# Patient Record
Sex: Female | Born: 1990
Health system: Southern US, Community
[De-identification: ages and names within clinical notes are randomized; demographics above are authoritative.]

## PROBLEM LIST (undated history)

## (undated) ENCOUNTER — Inpatient Hospital Stay (HOSPITAL_COMMUNITY): Payer: Self-pay

## (undated) ENCOUNTER — Inpatient Hospital Stay (HOSPITAL_COMMUNITY): Admission: RE | Payer: Self-pay | Source: Ambulatory Visit

## (undated) DIAGNOSIS — O139 Gestational [pregnancy-induced] hypertension without significant proteinuria, unspecified trimester: Secondary | ICD-10-CM

## (undated) DIAGNOSIS — K08199 Complete loss of teeth due to other specified cause, unspecified class: Secondary | ICD-10-CM

## (undated) DIAGNOSIS — O24419 Gestational diabetes mellitus in pregnancy, unspecified control: Secondary | ICD-10-CM

## (undated) DIAGNOSIS — J40 Bronchitis, not specified as acute or chronic: Secondary | ICD-10-CM

## (undated) DIAGNOSIS — K8020 Calculus of gallbladder without cholecystitis without obstruction: Secondary | ICD-10-CM

## (undated) DIAGNOSIS — N760 Acute vaginitis: Secondary | ICD-10-CM

## (undated) DIAGNOSIS — F419 Anxiety disorder, unspecified: Secondary | ICD-10-CM

## (undated) DIAGNOSIS — F1210 Cannabis abuse, uncomplicated: Secondary | ICD-10-CM

## (undated) DIAGNOSIS — R51 Headache: Secondary | ICD-10-CM

## (undated) DIAGNOSIS — E282 Polycystic ovarian syndrome: Secondary | ICD-10-CM

## (undated) DIAGNOSIS — Z803 Family history of malignant neoplasm of breast: Secondary | ICD-10-CM

## (undated) DIAGNOSIS — B9689 Other specified bacterial agents as the cause of diseases classified elsewhere: Secondary | ICD-10-CM

## (undated) DIAGNOSIS — IMO0001 Reserved for inherently not codable concepts without codable children: Secondary | ICD-10-CM

## (undated) HISTORY — DX: Gestational diabetes mellitus in pregnancy, unspecified control: O24.419

## (undated) HISTORY — PX: MOUTH SURGERY: SHX715

## (undated) HISTORY — DX: Cannabis abuse, uncomplicated: F12.10

## (undated) HISTORY — DX: Gestational (pregnancy-induced) hypertension without significant proteinuria, unspecified trimester: O13.9

## (undated) HISTORY — DX: Calculus of gallbladder without cholecystitis without obstruction: K80.20

## (undated) HISTORY — DX: Family history of malignant neoplasm of breast: Z80.3

## (undated) HISTORY — PX: MULTIPLE TOOTH EXTRACTIONS: SHX2053

---
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
## 2009-02-16 ENCOUNTER — Emergency Department (HOSPITAL_COMMUNITY): Admission: EM | Admit: 2009-02-16 | Discharge: 2009-02-16 | Payer: Self-pay | Admitting: Emergency Medicine

## 2009-05-06 ENCOUNTER — Emergency Department (HOSPITAL_COMMUNITY): Admission: EM | Admit: 2009-05-06 | Discharge: 2009-05-06 | Payer: Self-pay | Admitting: Emergency Medicine

## 2009-07-23 ENCOUNTER — Emergency Department (HOSPITAL_COMMUNITY): Admission: EM | Admit: 2009-07-23 | Discharge: 2009-07-24 | Payer: Self-pay | Admitting: Emergency Medicine

## 2010-01-29 ENCOUNTER — Emergency Department (HOSPITAL_COMMUNITY): Admission: EM | Admit: 2010-01-29 | Discharge: 2010-01-29 | Payer: Self-pay | Admitting: Emergency Medicine

## 2010-09-15 ENCOUNTER — Emergency Department (HOSPITAL_COMMUNITY): Admission: EM | Admit: 2010-09-15 | Discharge: 2010-09-15 | Payer: Self-pay | Admitting: Emergency Medicine

## 2010-10-01 ENCOUNTER — Ambulatory Visit: Payer: Self-pay | Admitting: Obstetrics and Gynecology

## 2010-10-01 LAB — CONVERTED CEMR LAB
Prolactin: 8.5 ng/mL
hCG, Beta Chain, Quant, S: <2 milliintl units/mL

## 2010-10-02 ENCOUNTER — Encounter: Payer: Self-pay | Admitting: Obstetrics and Gynecology

## 2010-10-02 LAB — CONVERTED CEMR LAB
Trich, Wet Prep: NONE SEEN
Yeast Wet Prep HPF POC: NONE SEEN

## 2010-10-03 ENCOUNTER — Ambulatory Visit (HOSPITAL_COMMUNITY)
Admission: RE | Admit: 2010-10-03 | Discharge: 2010-10-03 | Payer: Self-pay | Source: Home / Self Care | Admitting: Family Medicine

## 2010-10-24 ENCOUNTER — Ambulatory Visit: Payer: Self-pay | Admitting: Obstetrics and Gynecology

## 2010-11-07 ENCOUNTER — Ambulatory Visit: Payer: Self-pay | Admitting: Obstetrics & Gynecology

## 2010-11-20 ENCOUNTER — Encounter: Payer: Self-pay | Admitting: Obstetrics and Gynecology

## 2010-11-20 ENCOUNTER — Ambulatory Visit
Admission: RE | Admit: 2010-11-20 | Discharge: 2010-11-20 | Payer: Self-pay | Source: Home / Self Care | Attending: Obstetrics & Gynecology | Admitting: Obstetrics & Gynecology

## 2010-11-20 LAB — CONVERTED CEMR LAB: Hgb A1c MFr Bld: 5.3 % (ref <5.7)

## 2011-01-28 LAB — URINE MICROSCOPIC-ADD ON

## 2011-01-28 LAB — URINALYSIS, ROUTINE W REFLEX MICROSCOPIC
Leukocytes, UA: NEGATIVE
Nitrite: POSITIVE — AB
Specific Gravity, Urine: 1.004 — ABNORMAL LOW (ref 1.005–1.030)
pH: 6 (ref 5.0–8.0)

## 2011-01-28 LAB — WET PREP, GENITAL: Trich, Wet Prep: NONE SEEN

## 2011-01-30 ENCOUNTER — Ambulatory Visit: Payer: Self-pay | Admitting: Family Medicine

## 2011-07-14 ENCOUNTER — Emergency Department (HOSPITAL_COMMUNITY)
Admission: EM | Admit: 2011-07-14 | Discharge: 2011-07-14 | Disposition: A | Discharge status: Home / Self Care | Payer: Self-pay | Attending: Emergency Medicine | Admitting: Emergency Medicine

## 2011-07-14 DIAGNOSIS — S01501A Unspecified open wound of lip, initial encounter: Secondary | ICD-10-CM | POA: Insufficient documentation

## 2011-07-14 DIAGNOSIS — R220 Localized swelling, mass and lump, head: Secondary | ICD-10-CM | POA: Insufficient documentation

## 2011-07-14 DIAGNOSIS — S0003XA Contusion of scalp, initial encounter: Secondary | ICD-10-CM | POA: Insufficient documentation

## 2011-07-14 DIAGNOSIS — R221 Localized swelling, mass and lump, neck: Secondary | ICD-10-CM | POA: Insufficient documentation

## 2011-07-14 DIAGNOSIS — S0083XA Contusion of other part of head, initial encounter: Secondary | ICD-10-CM | POA: Insufficient documentation

## 2011-07-14 DIAGNOSIS — R51 Headache: Secondary | ICD-10-CM | POA: Insufficient documentation

## 2011-07-19 ENCOUNTER — Emergency Department (HOSPITAL_COMMUNITY)
Admission: EM | Admit: 2011-07-19 | Discharge: 2011-07-19 | Disposition: A | Discharge status: Home / Self Care | Payer: Self-pay | Attending: Emergency Medicine | Admitting: Emergency Medicine

## 2011-07-19 DIAGNOSIS — Z4802 Encounter for removal of sutures: Secondary | ICD-10-CM | POA: Insufficient documentation

## 2011-08-10 LAB — POCT URINALYSIS DIP (DEVICE)
Operator id: 235561
Protein, ur: 30 — AB
Specific Gravity, Urine: 1.02
Urobilinogen, UA: 0.2
pH: 6

## 2011-08-10 LAB — POCT PREGNANCY, URINE: Operator id: 235561

## 2011-08-25 LAB — CBC
MCV: 91.2
Platelets: 304
RDW: 11.8
WBC: 8.4

## 2011-08-25 LAB — TSH: TSH: 1.369

## 2011-08-25 LAB — COMPREHENSIVE METABOLIC PANEL
AST: 27
Albumin: 4
Calcium: 9
Chloride: 103
Creatinine, Ser: 0.67
Total Bilirubin: 0.6
Total Protein: 7.4

## 2011-08-25 LAB — DIFFERENTIAL
Eosinophils Relative: 1
Lymphocytes Relative: 28
Lymphs Abs: 2.3
Monocytes Absolute: 0.4
Monocytes Relative: 5
Neutro Abs: 5.6

## 2011-08-25 LAB — RAPID URINE DRUG SCREEN, HOSP PERFORMED
Amphetamines: NOT DETECTED
Barbiturates: NOT DETECTED
Benzodiazepines: NOT DETECTED
Cocaine: NOT DETECTED
Opiates: NOT DETECTED

## 2011-08-25 LAB — URINALYSIS, ROUTINE W REFLEX MICROSCOPIC
Bilirubin Urine: NEGATIVE
Glucose, UA: NEGATIVE
Hgb urine dipstick: NEGATIVE
Protein, ur: NEGATIVE
Urobilinogen, UA: 0.2

## 2011-08-25 LAB — PREGNANCY, URINE: Preg Test, Ur: NEGATIVE

## 2012-03-16 ENCOUNTER — Emergency Department (HOSPITAL_COMMUNITY)
Admission: EM | Admit: 2012-03-16 | Discharge: 2012-03-16 | Disposition: A | Discharge status: Home / Self Care | Payer: Self-pay | Attending: Emergency Medicine | Admitting: Emergency Medicine

## 2012-03-16 ENCOUNTER — Encounter (HOSPITAL_COMMUNITY): Payer: Self-pay | Admitting: Emergency Medicine

## 2012-03-16 DIAGNOSIS — A499 Bacterial infection, unspecified: Secondary | ICD-10-CM | POA: Insufficient documentation

## 2012-03-16 DIAGNOSIS — N760 Acute vaginitis: Secondary | ICD-10-CM | POA: Insufficient documentation

## 2012-03-16 DIAGNOSIS — IMO0001 Reserved for inherently not codable concepts without codable children: Secondary | ICD-10-CM | POA: Insufficient documentation

## 2012-03-16 DIAGNOSIS — B9689 Other specified bacterial agents as the cause of diseases classified elsewhere: Secondary | ICD-10-CM | POA: Insufficient documentation

## 2012-03-16 HISTORY — DX: Reserved for inherently not codable concepts without codable children: IMO0001

## 2012-03-16 LAB — WET PREP, GENITAL
Trich, Wet Prep: NONE SEEN
Yeast Wet Prep HPF POC: NONE SEEN

## 2012-03-16 LAB — URINALYSIS, ROUTINE W REFLEX MICROSCOPIC
Protein, ur: NEGATIVE mg/dL
Specific Gravity, Urine: 1.027 (ref 1.005–1.030)

## 2012-03-16 MED ORDER — METRONIDAZOLE 500 MG PO TABS: 500.0000 mg | ORAL_TABLET | Freq: Two times a day (BID) | ORAL | Status: AC

## 2012-03-16 NOTE — ED Provider Notes (Signed)
History     CSN: [id number]  Arrival date & time 03/16/12  2012   First MD Initiated Contact with Patient 03/16/12 2223      Chief Complaint  Patient presents with  . Exposure to STD    (Consider location/radiation/quality/duration/timing/severity/associated sxs/prior treatment) HPI Comments: Patient comes in with concern that she may have a STD.  She reports that she recently began having unprotected sex with a new partner.  Over the past 2 weeks she reports that she has been having whitish color vaginal discharge. She denies any dysuria.  Denies any fever or chills.  Denies any abdominal pain or pelvic pain.  She does not have any prior history of STD's.  She is unsure if her partner has any STD's.  Patient is a 21 y.o. female presenting with STD exposure. The history is provided by the patient.  Exposure to STD Pertinent negatives include no abdominal pain, chills, fever, nausea, rash or vomiting.    Past Medical History  Diagnosis Date  . No significant past medical history     History reviewed. No pertinent past surgical history.  History reviewed. No pertinent family history.  History  Substance Use Topics  . Smoking status: Never Smoker   . Smokeless tobacco: Not on file  . Alcohol Use: Yes     Occassional Use    OB History    Grav Para Term Preterm Abortions TAB SAB Ect Mult Living                  Review of Systems  Constitutional: Negative for fever and chills.  Respiratory: Negative for shortness of breath.   Gastrointestinal: Negative for nausea, vomiting and abdominal pain.  Genitourinary: Positive for vaginal discharge. Negative for dysuria, frequency, hematuria, flank pain, decreased urine volume, vaginal bleeding, genital sores, vaginal pain, pelvic pain and dyspareunia.  Skin: Negative for rash.    Allergies  Latex  Home Medications  No current outpatient prescriptions on file.  BP 135/67  Pulse 77  Temp(Src) 98.1 F (36.7 C) (Oral)  Resp  20  SpO2 99%  Physical Exam  Nursing note and vitals reviewed. Constitutional: She appears well-developed and well-nourished.  HENT:  Head: Normocephalic and atraumatic.  Cardiovascular: Normal rate, regular rhythm and normal heart sounds.   Pulmonary/Chest: Effort normal and breath sounds normal. No respiratory distress. She has no wheezes. She has no rales. She exhibits no tenderness.  Abdominal: Soft. Bowel sounds are normal. She exhibits no distension and no mass. There is no tenderness. There is no rebound and no guarding.  Genitourinary: Vagina normal and uterus normal. There is no rash, tenderness or lesion on the right labia. There is no rash, tenderness or lesion on the left labia. Cervix exhibits discharge. Cervix exhibits no motion tenderness and no friability. Right adnexum displays no mass, no tenderness and no fullness. Left adnexum displays no mass, no tenderness and no fullness.  Neurological: She is alert.  Skin: Skin is warm and dry. No rash noted. No erythema.  Psychiatric: She has a normal mood and affect.    ED Course  Procedures (including critical care time)   Labs Reviewed  URINALYSIS, ROUTINE W REFLEX MICROSCOPIC  POCT PREGNANCY, URINE  GC/CHLAMYDIA PROBE AMP, GENITAL  WET PREP, GENITAL   No results found.   1. Bacterial vaginosis       MDM  Patient comes in today with a chief complaint of whitish color vaginal discharge.  Wet prep positive for BV.  Patient given RX  for Flagyl and instructed to not drink alcohol while taking this medication.  GC/Chlamydia pending.        Pascal Lux [place], PA-C 03/18/12 1434

## 2012-03-16 NOTE — ED Notes (Signed)
D/c instructions reviewed w/ pt - pt denies any further questions or concerns at present.   

## 2012-03-16 NOTE — ED Notes (Signed)
Pt c/o white vaginal discharge with no odor.  States recent hx of bacterial vaginosis.  Denies pain, n/v.

## 2012-03-16 NOTE — ED Notes (Signed)
Patient reports that she has recently become sexually active; patient's partner told her that he has a history of STDs.  Patient reports itchiness, burning, and swelling in vaginal area.  Patient requesting to be tested for STDs.

## 2012-03-16 NOTE — Discharge Instructions (Signed)
Bacterial Vaginosis Bacterial vaginosis (BV) is a vaginal infection where the normal balance of bacteria in the vagina is disrupted. The normal balance is then replaced by an overgrowth of certain bacteria. There are several different kinds of bacteria that can cause BV. BV is the most common vaginal infection in women of childbearing age. CAUSES   The cause of BV is not fully understood. BV develops when there is an increase or imbalance of harmful bacteria.   Some activities or behaviors can upset the normal balance of bacteria in the vagina and put women at increased risk including:   Having a new sex partner or multiple sex partners.   Douching.   Using an intrauterine device (IUD) for contraception.   It is not clear what role sexual activity plays in the development of BV. However, women that have never had sexual intercourse are rarely infected with BV.  Women do not get BV from toilet seats, bedding, swimming pools or from touching objects around them.  SYMPTOMS   Grey vaginal discharge.   A fish-like odor with discharge, especially after sexual intercourse.   Itching or burning of the vagina and vulva.   Burning or pain with urination.   Some women have no signs or symptoms at all.  DIAGNOSIS  Your caregiver must examine the vagina for signs of BV. Your caregiver will perform lab tests and look at the sample of vaginal fluid through a microscope. They will look for bacteria and abnormal cells (clue cells), a pH test higher than 4.5, and a positive amine test all associated with BV.  RISKS AND COMPLICATIONS   Pelvic inflammatory disease (PID).   Infections following gynecology surgery.   Developing HIV.   Developing herpes virus.  TREATMENT  Sometimes BV will clear up without treatment. However, all women with symptoms of BV should be treated to avoid complications, especially if gynecology surgery is planned. Female partners generally do not need to be treated. However,  BV may spread between female sex partners so treatment is helpful in preventing a recurrence of BV.   BV may be treated with antibiotics. The antibiotics come in either pill or vaginal cream forms. Either can be used with nonpregnant or pregnant women, but the recommended dosages differ. These antibiotics are not harmful to the baby.   BV can recur after treatment. If this happens, a second round of antibiotics will often be prescribed.   Treatment is important for pregnant women. If not treated, BV can cause a premature delivery, especially for a pregnant woman who had a premature birth in the past. All pregnant women who have symptoms of BV should be checked and treated.   For chronic reoccurrence of BV, treatment with a type of prescribed gel vaginally twice a week is helpful.  HOME CARE INSTRUCTIONS   Finish all medication as directed by your caregiver.   Do not have sex until treatment is completed.   Tell your sexual partner that you have a vaginal infection. They should see their caregiver and be treated if they have problems, such as a mild rash or itching.   Practice safe sex. Use condoms. Only have 1 sex partner.  PREVENTION  Basic prevention steps can help reduce the risk of upsetting the natural balance of bacteria in the vagina and developing BV:  Do not have sexual intercourse (be abstinent).   Do not douche.   Use all of the medicine prescribed for treatment of BV, even if the signs and symptoms go away.     Tell your sex partner if you have BV. That way, they can be treated, if needed, to prevent reoccurrence.  SEEK MEDICAL CARE IF:   Your symptoms are not improving after 3 days of treatment.   You have increased discharge, pain, or fever.  MAKE SURE YOU:   Understand these instructions.   Will watch your condition.   Will get help right away if you are not doing well or get worse.  FOR MORE INFORMATION  Division of STD Prevention (DSTDP), Centers for Disease  Control and Prevention: www.cdc.gov/std American [employer] Health Association (ASHA): www.ashastd.org  Document Released: 11/02/2005 Document Revised: 10/22/2011 Document Reviewed: 04/25/2009 Exit[employer] Patient Information 2012 [employer]. 

## 2012-03-18 LAB — GC/CHLAMYDIA PROBE AMP, GENITAL: Chlamydia, DNA Probe: NEGATIVE

## 2012-03-28 NOTE — ED Provider Notes (Signed)
Medical screening examination/treatment/procedure(s) were performed by non-physician practitioner and as supervising physician I was immediately available for consultation/collaboration.   Loren Racer, MD 03/28/12 [phone]

## 2012-09-05 ENCOUNTER — Emergency Department (HOSPITAL_COMMUNITY)
Admission: EM | Admit: 2012-09-05 | Discharge: 2012-09-05 | Disposition: A | Discharge status: Home / Self Care | Payer: Self-pay | Attending: Emergency Medicine | Admitting: Emergency Medicine

## 2012-09-05 ENCOUNTER — Encounter (HOSPITAL_COMMUNITY): Payer: Self-pay

## 2012-09-05 DIAGNOSIS — N898 Other specified noninflammatory disorders of vagina: Secondary | ICD-10-CM | POA: Insufficient documentation

## 2012-09-05 DIAGNOSIS — Z91040 Latex allergy status: Secondary | ICD-10-CM | POA: Insufficient documentation

## 2012-09-05 DIAGNOSIS — R110 Nausea: Secondary | ICD-10-CM | POA: Insufficient documentation

## 2012-09-05 DIAGNOSIS — R1084 Generalized abdominal pain: Secondary | ICD-10-CM | POA: Insufficient documentation

## 2012-09-05 DIAGNOSIS — N912 Amenorrhea, unspecified: Secondary | ICD-10-CM | POA: Insufficient documentation

## 2012-09-05 DIAGNOSIS — R109 Unspecified abdominal pain: Secondary | ICD-10-CM

## 2012-09-05 LAB — URINALYSIS, ROUTINE W REFLEX MICROSCOPIC
Glucose, UA: NEGATIVE mg/dL
Ketones, ur: 40 mg/dL — AB
Leukocytes, UA: NEGATIVE
Nitrite: NEGATIVE
Specific Gravity, Urine: 1.028 (ref 1.005–1.030)
pH: 6 (ref 5.0–8.0)

## 2012-09-05 LAB — LIPASE, BLOOD: Lipase: 28 U/L (ref 11–59)

## 2012-09-05 LAB — CBC WITH DIFFERENTIAL/PLATELET
Basophils Absolute: 0 10*3/uL (ref 0.0–0.1)
Basophils Relative: 0 % (ref 0–1)
Eosinophils Absolute: 0.1 10*3/uL (ref 0.0–0.7)
MCH: 30.8 pg (ref 26.0–34.0)
MCHC: 33.2 g/dL (ref 30.0–36.0)
Monocytes Absolute: 0.9 10*3/uL (ref 0.1–1.0)
Neutro Abs: 7.2 10*3/uL (ref 1.7–7.7)
Neutrophils Relative %: 71 % (ref 43–77)
RDW: 12.5 % (ref 11.5–15.5)

## 2012-09-05 LAB — COMPREHENSIVE METABOLIC PANEL
AST: 17 U/L (ref 0–37)
Albumin: 4.1 g/dL (ref 3.5–5.2)
Chloride: 104 mEq/L (ref 96–112)
Creatinine, Ser: 0.71 mg/dL (ref 0.50–1.10)
Potassium: 4 mEq/L (ref 3.5–5.1)
Total Bilirubin: 0.5 mg/dL (ref 0.3–1.2)
Total Protein: 7.4 g/dL (ref 6.0–8.3)

## 2012-09-05 LAB — WET PREP, GENITAL
Clue Cells Wet Prep HPF POC: NONE SEEN
Trich, Wet Prep: NONE SEEN
WBC, Wet Prep HPF POC: NONE SEEN
Yeast Wet Prep HPF POC: NONE SEEN

## 2012-09-05 LAB — POCT PREGNANCY, URINE: Preg Test, Ur: NEGATIVE

## 2012-09-05 NOTE — ED Notes (Signed)
Pt sts she does not have a menstrual cycle ever and no known cause for not having one.

## 2012-09-05 NOTE — ED Provider Notes (Signed)
History     CSN: [id number]  Arrival date & time 09/05/12  1231   First MD Initiated Contact with Patient 09/05/12 1256      Chief Complaint  Patient presents with  . Abdominal Pain    (Consider location/radiation/quality/duration/timing/severity/associated sxs/prior treatment) HPI Comments: Patient reports she has had diffuse lower abdominal pain x 3-4 weeks.  Pain is described as sore.  She has also been having nausea every morning.  Nausea is worse with eating.  Pt also notes "lots of pressure" when she has to have a bowel movement, increased number of bowel movements (8 daily vs her norm of 2-3 daily).  States pain is much improved after bowel movements, and last week she did a "colon cleanse" which temporarily alleviated her pain.  Pain also improves with exercise.  Has had three days of watery vaginal discharge following sexual activity last week during which the condom broke.  Pt has vomited twice in the past 4 weeks, only with brushing her teeth.  Denies fevers, chills, myalgias, urinary symptoms.  Pt has hx of amenorrhea, only menstruates when she is given birth control pills or "provera challenge" - states she has been off of these for a few months because her prescription ran out and she has not made a new appointment.  Has taken pregnancy tests weekly for the past three weeks that have been negative.  Pt also notes she was taking weight loss pills when this pain began, last dose was two weeks ago.  Now she is taking something called "Vitex" that is a vitamin supplement designed for people who have problems with their menstrual cycle.    Patient is a 21 y.o. female presenting with abdominal pain. The history is provided by the patient.  Abdominal Pain The primary symptoms of the illness include abdominal pain, nausea and vaginal discharge. The primary symptoms of the illness do not include fever, shortness of breath, dysuria or vaginal bleeding.  The vaginal discharge is not  associated with dysuria.   Symptoms associated with the illness do not include chills, urgency or frequency.    Past Medical History  Diagnosis Date  . No significant past medical history     No past surgical history on file.  No family history on file.  History  Substance Use Topics  . Smoking status: Never Smoker   . Smokeless tobacco: Not on file  . Alcohol Use: Yes     Occassional Use    OB History    Grav Para Term Preterm Abortions TAB SAB Ect Mult Living                  Review of Systems  Constitutional: Negative for fever and chills.  Respiratory: Negative for shortness of breath.   Cardiovascular: Negative for chest pain.  Gastrointestinal: Positive for nausea and abdominal pain. Negative for blood in stool.  Genitourinary: Positive for vaginal discharge. Negative for dysuria, urgency, frequency and vaginal bleeding.  Musculoskeletal: Negative for myalgias.    Allergies  Latex  Home Medications   Current Outpatient Rx  Name Route Sig Dispense Refill  . VITEX EXTRACT PO Oral Take 1 tablet by mouth 3 (three) times daily.    Marland Kitchen OVER THE COUNTER MEDICATION Oral Take 1 tablet by mouth 2 (two) times daily. CELLUCOR HD (GREEN BOTTLE)    . OVER THE COUNTER MEDICATION Oral Take 3 tablets by mouth 3 (three) times daily with meals. CELLUCOR (RED BOTTLE) STRAWBERRY      BP 122/73  Pulse 56  Temp 98.2 F (36.8 C) (Oral)  Resp 18  SpO2 99%  Physical Exam  Nursing note and vitals reviewed. Constitutional: She appears well-developed and well-nourished. No distress.  HENT:  Head: Normocephalic and atraumatic.  Neck: Neck supple.  Cardiovascular: Normal rate and regular rhythm.   Pulmonary/Chest: Effort normal and breath sounds normal. No respiratory distress. She has no wheezes. She has no rales.  Abdominal: Soft. Bowel sounds are normal. She exhibits no distension and no mass. There is tenderness. There is no rebound, no guarding and no CVA tenderness.        Diffuse tenderness across lower abdomen, worse in suprapubic area  Genitourinary: Vagina normal. Cervix exhibits no motion tenderness, no discharge and no friability. Right adnexum displays no mass, no tenderness and no fullness. Left adnexum displays no mass, no tenderness and no fullness.       Mild suprapubic tenderness on bimanual.    Neurological: She is alert.  Skin: She is not diaphoretic.    ED Course  Procedures (including critical care time)  Labs Reviewed  URINALYSIS, ROUTINE W REFLEX MICROSCOPIC - Abnormal; Notable for the following:    Ketones, ur 40 (*)     All other components within normal limits  CBC WITH DIFFERENTIAL  COMPREHENSIVE METABOLIC PANEL  LIPASE, BLOOD  WET PREP, GENITAL  POCT PREGNANCY, URINE  GC/CHLAMYDIA PROBE AMP, GENITAL   No results found.  2:27 PM Patient states she has received a call from work and she has to be there or she will lose her job.  I have advised her that her labs are normal but we do not have results from her UA, urine pregnancy, and vaginal swabs.  Pt states she will come back after work.  Given patient's exam and history, I feel that patient may be discharged.  She is not having any urinary symptoms, and while she is having abnormal vaginal discharge, her exam is not consistent with PID or any deeper infection.  Pelvic ultrasound was initially ordered by me by accident - I was attempting to put in the order for the pelvic cart and accidentally ordered the wrong thing.    1. Abdominal pain     MDM  Patient with lower abdominal pain and occasional nausea x 3-4 weeks.  Pt with watery vaginal discharge x 3 days that I believe is unrelated.  GC/Chlam pending.  UA without infection.  Upreg is negative.  Labs are normal.  Suspect element of constipation causing patient's symptoms as she gets relief with bowel movements and bowel cleanse and the 8 BMs she is having daily are very small in amount.  Patient was unable to stay for remainder of  workup.  Abdominal exam was benign, nonsurgical.  Pt given resources for follow up, information for how to find her results.  Pt given return precautions.  Pt verbalizes understanding and agrees with plan.  Pt states she may return tonight for the remainder of her workup.         [place], [place] 09/05/12 1544

## 2012-09-05 NOTE — ED Notes (Signed)
Pt compalins of abd pain wosre in morning and after eating food, pts decreased appetite, sts has taken pregnancy test and all are negative.

## 2012-09-05 NOTE — ED Provider Notes (Signed)
Medical screening examination/treatment/procedure(s) were performed by non-physician practitioner and as supervising physician I was immediately available for consultation/collaboration.  Flint Melter, MD 09/05/12 1728

## 2012-10-20 ENCOUNTER — Emergency Department (HOSPITAL_COMMUNITY)
Admission: EM | Admit: 2012-10-20 | Discharge: 2012-10-21 | Disposition: A | Discharge status: Home / Self Care | Payer: Self-pay | Attending: Emergency Medicine | Admitting: Emergency Medicine

## 2012-10-20 ENCOUNTER — Encounter (HOSPITAL_COMMUNITY): Payer: Self-pay | Admitting: *Deleted

## 2012-10-20 DIAGNOSIS — R350 Frequency of micturition: Secondary | ICD-10-CM | POA: Insufficient documentation

## 2012-10-20 DIAGNOSIS — Z3202 Encounter for pregnancy test, result negative: Secondary | ICD-10-CM | POA: Insufficient documentation

## 2012-10-20 DIAGNOSIS — Z79899 Other long term (current) drug therapy: Secondary | ICD-10-CM | POA: Insufficient documentation

## 2012-10-20 DIAGNOSIS — R300 Dysuria: Secondary | ICD-10-CM | POA: Insufficient documentation

## 2012-10-20 DIAGNOSIS — R198 Other specified symptoms and signs involving the digestive system and abdomen: Secondary | ICD-10-CM

## 2012-10-20 DIAGNOSIS — R634 Abnormal weight loss: Secondary | ICD-10-CM | POA: Insufficient documentation

## 2012-10-20 DIAGNOSIS — R5381 Other malaise: Secondary | ICD-10-CM | POA: Insufficient documentation

## 2012-10-20 DIAGNOSIS — R42 Dizziness and giddiness: Secondary | ICD-10-CM | POA: Insufficient documentation

## 2012-10-20 DIAGNOSIS — K5900 Constipation, unspecified: Secondary | ICD-10-CM | POA: Insufficient documentation

## 2012-10-20 DIAGNOSIS — R110 Nausea: Secondary | ICD-10-CM

## 2012-10-20 DIAGNOSIS — R5383 Other fatigue: Secondary | ICD-10-CM | POA: Insufficient documentation

## 2012-10-20 DIAGNOSIS — R109 Unspecified abdominal pain: Secondary | ICD-10-CM

## 2012-10-20 DIAGNOSIS — N912 Amenorrhea, unspecified: Secondary | ICD-10-CM

## 2012-10-20 DIAGNOSIS — R197 Diarrhea, unspecified: Secondary | ICD-10-CM | POA: Insufficient documentation

## 2012-10-20 LAB — CBC WITH DIFFERENTIAL/PLATELET
Basophils Absolute: 0 10*3/uL (ref 0.0–0.1)
Basophils Relative: 0 % (ref 0–1)
Eosinophils Absolute: 0.1 10*3/uL (ref 0.0–0.7)
Eosinophils Relative: 1 % (ref 0–5)
HCT: 40.3 % (ref 36.0–46.0)
Hemoglobin: 13.5 g/dL (ref 12.0–15.0)
MCH: 31.3 pg (ref 26.0–34.0)
MCHC: 33.5 g/dL (ref 30.0–36.0)
MCV: 93.5 fL (ref 78.0–100.0)
Monocytes Absolute: 0.7 10*3/uL (ref 0.1–1.0)
Monocytes Relative: 7 % (ref 3–12)
RDW: 12.3 % (ref 11.5–15.5)

## 2012-10-20 LAB — URINALYSIS, ROUTINE W REFLEX MICROSCOPIC
Bilirubin Urine: NEGATIVE
Ketones, ur: NEGATIVE mg/dL
Leukocytes, UA: NEGATIVE
Nitrite: NEGATIVE
Protein, ur: NEGATIVE mg/dL
pH: 6 (ref 5.0–8.0)

## 2012-10-20 LAB — BASIC METABOLIC PANEL
BUN: 11 mg/dL (ref 6–23)
Calcium: 9.4 mg/dL (ref 8.4–10.5)
Chloride: 103 mEq/L (ref 96–112)
Creatinine, Ser: 0.77 mg/dL (ref 0.50–1.10)
GFR calc Af Amer: >90 mL/min (ref >90)

## 2012-10-20 NOTE — ED Notes (Signed)
Pt has been trying to get pregnant and has been having many symptoms.  She reports nausea as welll as abdominal pain (after eating), fluttering in her lower abdomen, breast tenderness.  Pt has taken 5 home pregnancy tests which were all negative.  Pt denies any pain or burning with urination, she reports frequent urination.

## 2012-10-21 MED ORDER — FAMOTIDINE 20 MG PO TABS
20.0000 mg | ORAL_TABLET | Freq: Every day | ORAL | Status: DC
  Administered 2012-10-21: 20 mg via ORAL
  Filled 2012-10-21: qty 1

## 2012-10-21 MED ORDER — ONDANSETRON 4 MG PO TBDP
8.0000 mg | ORAL_TABLET | Freq: Once | ORAL | Status: AC
  Administered 2012-10-21: 8 mg via ORAL
  Filled 2012-10-21: qty 2

## 2012-10-21 MED ORDER — FAMOTIDINE 20 MG PO TABS: 20.0000 mg | ORAL_TABLET | Freq: Every day | ORAL | Status: DC

## 2012-10-21 MED ORDER — POLYETHYLENE GLYCOL 3350 17 G PO PACK: 17.0000 g | PACK | Freq: Every day | ORAL | Status: DC

## 2012-10-21 MED ORDER — ONDANSETRON HCL 4 MG PO TABS: 4.0000 mg | ORAL_TABLET | Freq: Four times a day (QID) | ORAL | Status: DC

## 2012-10-21 NOTE — ED Provider Notes (Signed)
History     CSN: [id number]  Arrival date & time 10/20/12  2047   First MD Initiated Contact with Patient 10/20/12 2312      Chief Complaint  Patient presents with  . Abdominal Pain    (Consider location/radiation/quality/duration/timing/severity/associated sxs/prior treatment) HPI 21 yo female presents to the ER with multiple complaints.  Pt reports she has been trying to get pregnant for some time without success.  Pt has amenorrhea, has been to OB who thought she may have PCOS.  Pt has not f/u since that time due to finances.  Pt feels she may be pregnant due to nausea, fluttering in abdomen, breast tenderness.  She has taken multiple negative pregnancy tests.  Pt also c/o alternating constipation and diarrhea.  She has urinary frequency.  Pt seen in ER 2 weeks ago, left prior to completion of workup.  Pt reports constant nausea, no vomiting.  Pt only eating cereal with milk as that's the only thing she can tolerate.   Past Medical History  Diagnosis Date  . No significant past medical history     History reviewed. No pertinent past surgical history.  No family history on file.  History  Substance Use Topics  . Smoking status: Never Smoker   . Smokeless tobacco: Not on file  . Alcohol Use: Yes     Comment: Occassional Use    OB History    Grav Para Term Preterm Abortions TAB SAB Ect Mult Living                  Review of Systems  Constitutional: Positive for appetite change, fatigue and unexpected weight change.  Gastrointestinal: Positive for nausea, diarrhea and constipation.  Genitourinary: Positive for dysuria and frequency.  Neurological: Positive for dizziness, weakness and light-headedness.  Psychiatric/Behavioral: Positive for dysphoric mood. The patient is nervous/anxious.   All other systems reviewed and are negative.    Allergies  Latex  Home Medications   Current Outpatient Rx  Name  Route  Sig  Dispense  Refill  . ASPIRIN-ACETAMINOPHEN-CAFFEINE  250-250-65 MG PO TABS   Oral   Take 2 tablets by mouth every 6 (six) hours as needed. For migraine.         Marland Kitchen VITEX EXTRACT PO   Oral   Take 1 tablet by mouth 3 (three) times daily.         Marland Kitchen FAMOTIDINE 20 MG PO TABS   Oral   Take 1 tablet (20 mg total) by mouth daily.   30 tablet   0   . ONDANSETRON HCL 4 MG PO TABS   Oral   Take 1 tablet (4 mg total) by mouth every 6 (six) hours. PRN nausea   12 tablet   0   . POLYETHYLENE GLYCOL 3350 PO PACK   Oral   Take 17 g by mouth daily.   14 each   0     BP 132/84  Pulse 69  Temp 98.5 F (36.9 C) (Oral)  Resp 18  SpO2 98%  LMP 09/23/2012  Physical Exam  Nursing note and vitals reviewed. Constitutional: She is oriented to person, place, and time. She appears well-developed and well-nourished.  HENT:  Head: Normocephalic and atraumatic.  Nose: Nose normal.  Mouth/Throat: Oropharynx is clear and moist.  Eyes: Conjunctivae normal and EOM are normal. Pupils are equal, round, and reactive to light.  Neck: Normal range of motion. Neck supple. No JVD present. No tracheal deviation present. No thyromegaly present.  Cardiovascular: Normal  rate, regular rhythm, normal heart sounds and intact distal pulses.  Exam reveals no gallop and no friction rub.   No murmur heard. Pulmonary/Chest: Effort normal and breath sounds normal. No stridor. No respiratory distress. She has no wheezes. She has no rales. She exhibits no tenderness.  Abdominal: Soft. Bowel sounds are normal. She exhibits no distension and no mass. There is tenderness (diffuse tenderness). There is no rebound and no guarding.  Musculoskeletal: Normal range of motion. She exhibits no edema and no tenderness.  Lymphadenopathy:    She has no cervical adenopathy.  Neurological: She is alert and oriented to person, place, and time. She exhibits normal muscle tone. Coordination normal.  Skin: Skin is warm and dry. No rash noted. No erythema. No pallor.  Psychiatric: Her  behavior is normal. Judgment and thought content normal.       Tearful, flat affect    ED Course  Procedures (including critical care time)  Labs Reviewed  CBC WITH DIFFERENTIAL - Abnormal; Notable for the following:    WBC 11.1 (*)     All other components within normal limits  BASIC METABOLIC PANEL  URINALYSIS, ROUTINE W REFLEX MICROSCOPIC  POCT PREGNANCY, URINE  LAB REPORT - SCANNED   No results found.   1. Nausea   2. Abdominal pain   3. Alternating constipation and diarrhea   4. Amenorrhea       MDM  21 yo female with amenorrhea, infertility, possible IBS, anxiety, depression.  WIll refer to local [facility] for further workup.        Olivia Mackie, MD 10/21/12 2126

## 2012-11-05 ENCOUNTER — Emergency Department (HOSPITAL_COMMUNITY)
Admission: EM | Admit: 2012-11-05 | Discharge: 2012-11-05 | Disposition: A | Discharge status: Home / Self Care | Payer: Self-pay | Attending: Emergency Medicine | Admitting: Emergency Medicine

## 2012-11-05 ENCOUNTER — Encounter (HOSPITAL_COMMUNITY): Payer: Self-pay | Admitting: *Deleted

## 2012-11-05 ENCOUNTER — Emergency Department (HOSPITAL_COMMUNITY): Payer: Self-pay

## 2012-11-05 DIAGNOSIS — J40 Bronchitis, not specified as acute or chronic: Secondary | ICD-10-CM | POA: Insufficient documentation

## 2012-11-05 DIAGNOSIS — M79609 Pain in unspecified limb: Secondary | ICD-10-CM | POA: Insufficient documentation

## 2012-11-05 DIAGNOSIS — N644 Mastodynia: Secondary | ICD-10-CM | POA: Insufficient documentation

## 2012-11-05 DIAGNOSIS — R062 Wheezing: Secondary | ICD-10-CM | POA: Insufficient documentation

## 2012-11-05 DIAGNOSIS — M79621 Pain in right upper arm: Secondary | ICD-10-CM

## 2012-11-05 MED ORDER — ALBUTEROL SULFATE HFA 108 (90 BASE) MCG/ACT IN AERS
2.0000 | INHALATION_SPRAY | Freq: Once | RESPIRATORY_TRACT | Status: AC
  Administered 2012-11-05: 2 via RESPIRATORY_TRACT
  Filled 2012-11-05: qty 6.7

## 2012-11-05 NOTE — ED Provider Notes (Signed)
Medical screening examination/treatment/procedure(s) were performed by non-physician practitioner and as supervising physician I was immediately available for consultation/collaboration.    Celene Kras, MD 11/05/12 [phone]

## 2012-11-05 NOTE — ED Provider Notes (Signed)
History     CSN: [id number]  Arrival date & time 11/05/12  1353   First MD Initiated Contact with Patient 11/05/12 1431      Chief Complaint  Patient presents with  . Cough    (Consider location/radiation/quality/duration/timing/severity/associated sxs/prior treatment) HPI Kellie Deleon is a 21 y.o. female who presents with complaint of cough, right axilla, and right breast pain. States cough has been there for about 2 wks, states coughing up "black chunks." States at times wheezing, short of breath. Denies fever, chills, malaise. States also noted right axilla "nodule" that comes and goes, at times it is tender, at times it is not. States also feels like right breast is tender at times, this comes and goes as well. States has irregular menses, and thinks she may start her period soon. She has an apt for this breast pain that she has had for multiple months, with GYN doctor on Jan 6th. States no pain in axilla at this time. Pt denies fever, chills, malaise. Denies current SOB. No other URI symptoms. States she is a smoker.    Past Medical History  Diagnosis Date  . No significant past medical history     History reviewed. No pertinent past surgical history.  No family history on file.  History  Substance Use Topics  . Smoking status: Never Smoker   . Smokeless tobacco: Not on file  . Alcohol Use: Yes     Comment: Occassional Use    OB History    Grav Para Term Preterm Abortions TAB SAB Ect Mult Living                  Review of Systems  Constitutional: Negative for fever and chills.  HENT: Negative for neck pain and neck stiffness.   Respiratory: Positive for cough and wheezing.   Cardiovascular: Negative.   Gastrointestinal: Negative for nausea, vomiting and abdominal pain.  Genitourinary:       Positive for breast tenderness  Skin: Negative.   Neurological: Negative.   Hematological: Negative for adenopathy.    Allergies  Latex  Home Medications    Current Outpatient Rx  Name  Route  Sig  Dispense  Refill  . ASPIRIN-ACETAMINOPHEN-CAFFEINE 250-250-65 MG PO TABS   Oral   Take 2 tablets by mouth every 6 (six) hours as needed. For migraine.         Marland Kitchen VITEX EXTRACT PO   Oral   Take 1 tablet by mouth 3 (three) times daily.         Marland Kitchen FAMOTIDINE 20 MG PO TABS   Oral   Take 1 tablet (20 mg total) by mouth daily.   30 tablet   0   . ONDANSETRON HCL 4 MG PO TABS   Oral   Take 1 tablet (4 mg total) by mouth every 6 (six) hours. PRN nausea   12 tablet   0     BP 123/74  Pulse 100  Temp 98.3 F (36.8 C) (Oral)  Resp 18  Ht 5\' 6"  (1.676 m)  Wt 180 lb (81.647 kg)  BMI 29.05 kg/m2  SpO2 96%  LMP 09/23/2012  Physical Exam  Nursing note and vitals reviewed. Constitutional: She appears well-developed and well-nourished. No distress.  HENT:  Head: Normocephalic.  Eyes: Conjunctivae normal are normal.  Neck: Neck supple.  Cardiovascular: Normal rate, regular rhythm and normal heart sounds.   Pulmonary/Chest: Effort normal and breath sounds normal. No respiratory distress. She has no wheezes. She has no rales.  Normal breast exam bilaterally, no swelling, tenderness, nodules  Neurological: She is alert.  Skin:       Normal right axilla exam with no swelling, tenderness, nodules, abscesses, no lymphadenopathy    ED Course  Procedures (including critical care time)  Labs Reviewed - No data to display Dg Chest 2 View  11/05/2012  *RADIOLOGY REPORT*  Clinical Data: Cough, shortness of breath, history smoking  CHEST - 2 VIEW  Comparison: None  Findings: Upper-normal size of cardiac silhouette. Mediastinal contours and pulmonary vascularity normal. Lungs clear. No pleural effusion or pneumothorax. Bones unremarkable.  IMPRESSION: No acute abnormalities.   Original Report Authenticated By: Ulyses Southward, M.D.      1. Bronchitis   2. Pain in right axilla   3. Breast pain, right       MDM  Pt with cough, wheezing  at home, lungs clear today, coughing up "black chunks." Pt in o distress. She is PERC negative, no chest pain. VS normal. Afebrile. Right axilla and breast exam normal. Pt has follow up with GYN in 2 weeks. i do not see any emergent process at this time. Pt stable for d/c home with close follow up with PCP and GYN. Instructed to quit smoking. Inhaler given for wheezing.    Filed Vitals:   11/05/12 1404  BP: 123/74  Pulse: 100  Temp: 98.3 F (36.8 C)  Resp: [address] [place]iope Delangel, [place] 11/05/12 1555

## 2012-11-05 NOTE — ED Notes (Signed)
Pt c/o a cough x2 weeks, and coughing up "black chunks", also c/o of a boil under her right arm x2 months

## 2012-11-21 ENCOUNTER — Encounter (HOSPITAL_COMMUNITY): Payer: Self-pay

## 2012-11-21 ENCOUNTER — Emergency Department (HOSPITAL_COMMUNITY)
Admission: EM | Admit: 2012-11-21 | Discharge: 2012-11-21 | Disposition: A | Discharge status: Home / Self Care | Payer: Self-pay | Attending: Emergency Medicine | Admitting: Emergency Medicine

## 2012-11-21 DIAGNOSIS — R059 Cough, unspecified: Secondary | ICD-10-CM | POA: Insufficient documentation

## 2012-11-21 DIAGNOSIS — Z7982 Long term (current) use of aspirin: Secondary | ICD-10-CM | POA: Insufficient documentation

## 2012-11-21 DIAGNOSIS — Z8742 Personal history of other diseases of the female genital tract: Secondary | ICD-10-CM | POA: Insufficient documentation

## 2012-11-21 DIAGNOSIS — N939 Abnormal uterine and vaginal bleeding, unspecified: Secondary | ICD-10-CM | POA: Insufficient documentation

## 2012-11-21 DIAGNOSIS — Z79899 Other long term (current) drug therapy: Secondary | ICD-10-CM | POA: Insufficient documentation

## 2012-11-21 DIAGNOSIS — R0789 Other chest pain: Secondary | ICD-10-CM | POA: Insufficient documentation

## 2012-11-21 DIAGNOSIS — R05 Cough: Secondary | ICD-10-CM | POA: Insufficient documentation

## 2012-11-21 DIAGNOSIS — N926 Irregular menstruation, unspecified: Secondary | ICD-10-CM | POA: Insufficient documentation

## 2012-11-21 DIAGNOSIS — Z760 Encounter for issue of repeat prescription: Secondary | ICD-10-CM | POA: Insufficient documentation

## 2012-11-21 DIAGNOSIS — Z3202 Encounter for pregnancy test, result negative: Secondary | ICD-10-CM | POA: Insufficient documentation

## 2012-11-21 DIAGNOSIS — N644 Mastodynia: Secondary | ICD-10-CM | POA: Insufficient documentation

## 2012-11-21 HISTORY — DX: Complete loss of teeth due to other specified cause, unspecified class: K08.199

## 2012-11-21 LAB — URINALYSIS, ROUTINE W REFLEX MICROSCOPIC
Glucose, UA: NEGATIVE mg/dL
Leukocytes, UA: NEGATIVE
Nitrite: NEGATIVE
Protein, ur: NEGATIVE mg/dL
Urobilinogen, UA: 0.2 mg/dL (ref 0.0–1.0)

## 2012-11-21 LAB — PREGNANCY, URINE: Preg Test, Ur: NEGATIVE

## 2012-11-21 MED ORDER — ONDANSETRON 4 MG PO TBDP: 4.0000 mg | ORAL_TABLET | Freq: Three times a day (TID) | ORAL | Status: DC | PRN

## 2012-11-21 MED ORDER — ALBUTEROL SULFATE HFA 108 (90 BASE) MCG/ACT IN AERS
2.0000 | INHALATION_SPRAY | Freq: Once | RESPIRATORY_TRACT | Status: AC
  Administered 2012-11-21: 2 via RESPIRATORY_TRACT
  Filled 2012-11-21: qty 6.7

## 2012-11-21 NOTE — ED Notes (Signed)
Pt reports (R) side breast heat, pain and swelling, pain radiates to under her (R) axillary region. Pt has been seen several times for the same thing w/no change. Pt denies d/c from her nipple or change in shape of her nipple.

## 2012-11-21 NOTE — ED Notes (Signed)
Pt reports last menstrual cycle was in August, unsure if she is pregnant

## 2012-11-21 NOTE — ED Provider Notes (Signed)
History     CSN: [id number]  Arrival date & time 11/21/12  Paulo Fruit   First MD Initiated Contact with Patient 11/21/12 2102      Chief Complaint  Patient presents with  . Breast Pain    (Consider location/radiation/quality/duration/timing/severity/associated sxs/prior treatment) HPI Comments: Kellie Deleon presents ambulatory for evaluation of recurrent right breast pain.  She reports she has experienced intermittent soreness and pain in her right breast over several months.  She had a "knot" under her fight arm some time ago that resolved.  She had a similar swollen tender area under the left arm that has also resolved.  She reports within the last week there has been some redness along the lateral aspect of the right breast.  She denies nipple discharges or retraction.  She also denies exogenous estrogen ingestion, fever, trauma, history of cancer, and problems with cellulitis.  The history is provided by the patient. No language interpreter was used.    Past Medical History  Diagnosis Date  . No significant past medical history   . Loss of teeth due to extraction     History reviewed. No pertinent past surgical history.  Family History  Problem Relation Age of Onset  . Diabetes Mother   . Hypertension Mother   . Cancer Other     History  Substance Use Topics  . Smoking status: Never Smoker   . Smokeless tobacco: Not on file  . Alcohol Use: Yes     Comment: Occassional Use    OB History    Grav Para Term Preterm Abortions TAB SAB Ect Mult Living                  Review of Systems  Constitutional: Negative.   HENT: Negative.   Eyes: Negative.   Respiratory: Positive for cough (chronic) and chest tightness (chronic).   Cardiovascular: Negative.   Genitourinary: Positive for menstrual problem (LNMP 8/13.  reports hx of polycystic ovarian syndrome.).  Neurological: Negative.   Psychiatric/Behavioral: Negative.   All other systems reviewed and are  negative.    Allergies  Latex  Home Medications   Current Outpatient Rx  Name  Route  Sig  Dispense  Refill  . ALBUTEROL SULFATE HFA 108 (90 BASE) MCG/ACT IN AERS   Inhalation   Inhale 2 puffs into the lungs every 6 (six) hours as needed. Wheezing/shortness of breath.         . ASPIRIN-ACETAMINOPHEN-CAFFEINE 250-250-65 MG PO TABS   Oral   Take 2 tablets by mouth every 6 (six) hours as needed. For migraine.         Marland Kitchen FAMOTIDINE 20 MG PO TABS   Oral   Take 1 tablet (20 mg total) by mouth daily.   30 tablet   0   . ONDANSETRON HCL 4 MG PO TABS   Oral   Take 1 tablet (4 mg total) by mouth every 6 (six) hours. PRN nausea   12 tablet   0     BP 138/83  Pulse 91  Temp 98.7 F (37.1 C) (Oral)  Resp 17  SpO2 100%  Physical Exam  Nursing note and vitals reviewed. Constitutional: She is oriented to person, place, and time. She appears well-developed and well-nourished. No distress.  HENT:  Head: Normocephalic and atraumatic.  Right Ear: External ear normal.  Left Ear: External ear normal.  Nose: Nose normal.  Mouth/Throat: Oropharynx is clear and moist. No oropharyngeal exudate.  Eyes: Conjunctivae normal are normal. Pupils are equal,  round, and reactive to light. Right eye exhibits no discharge. Left eye exhibits no discharge. No scleral icterus.  Neck: Normal range of motion. Neck supple. No JVD present. No tracheal deviation present.  Cardiovascular: Normal rate, regular rhythm and intact distal pulses.  Exam reveals friction rub. Exam reveals no gallop.   No murmur heard. Pulmonary/Chest: Effort normal and breath sounds normal. No stridor. No respiratory distress. She has no wheezes. She has no rales. She exhibits no tenderness.  Abdominal: Soft. Bowel sounds are normal. She exhibits no distension and no mass. There is no tenderness. There is no rebound and no guarding.  Genitourinary: No breast swelling, tenderness, discharge or bleeding. Pelvic exam was performed  with patient supine.       No skin changes, breast masses, orange pealing, nipple retraction, nipple discharges, or erythema appreciated.  No axillary adenopathy appreciated.  Examined both breasts and axilla.  Musculoskeletal: Normal range of motion. She exhibits no edema and no tenderness.  Lymphadenopathy:    She has no cervical adenopathy.  Neurological: She is alert and oriented to person, place, and time. No cranial nerve deficit.  Skin: Skin is warm. No rash noted. She is not diaphoretic. No erythema. No pallor.  Psychiatric: She has a normal mood and affect. Her behavior is normal.    ED Course  Procedures (including critical care time)   Labs Reviewed  URINALYSIS, ROUTINE W REFLEX MICROSCOPIC  PREGNANCY, URINE   No results found.   No diagnosis found.    MDM  Pt presents for evaluation of recurrent right breast pain.  She has no breast swelling, erythema, axillary adenopathy, palpable abscesses, or palpable masses.  She appears nontoxic, NAD.  She has follow-up later this week with gyn.  At this time, encouraged her to discuss this issue when she goes to her follow-up appointment.  She will be discharged home.  Will refill her albuterol inhaler and provide a prescription for zofran as she has run out of both which she takes for treatment of chronic nausea and bronchitis.  Discussed the health benefits of smoking cessation.        Tobin Chad, MD 11/21/12 2153

## 2012-11-21 NOTE — ED Notes (Signed)
Pt reports having some swelling in left axilla area. Upon palpation, left axilla area is slightly more swollen than right axilla.  On the left breast, there is an area of increased hardness palpated but no distinct lump palpated.  On inspection, no major difference noted on breast.  Pt reports tenderness on the bottom of left breast and around the nipple.

## 2012-11-22 DIAGNOSIS — K08109 Complete loss of teeth, unspecified cause, unspecified class: Secondary | ICD-10-CM | POA: Insufficient documentation

## 2012-11-22 DIAGNOSIS — Z7982 Long term (current) use of aspirin: Secondary | ICD-10-CM | POA: Insufficient documentation

## 2012-11-22 DIAGNOSIS — F411 Generalized anxiety disorder: Secondary | ICD-10-CM | POA: Insufficient documentation

## 2012-11-22 DIAGNOSIS — N644 Mastodynia: Secondary | ICD-10-CM | POA: Insufficient documentation

## 2012-11-22 DIAGNOSIS — Z79899 Other long term (current) drug therapy: Secondary | ICD-10-CM | POA: Insufficient documentation

## 2012-11-22 DIAGNOSIS — Z8742 Personal history of other diseases of the female genital tract: Secondary | ICD-10-CM | POA: Insufficient documentation

## 2012-11-22 DIAGNOSIS — F410 Panic disorder [episodic paroxysmal anxiety] without agoraphobia: Secondary | ICD-10-CM | POA: Insufficient documentation

## 2012-11-22 NOTE — ED Notes (Signed)
Patient complaining of heart palpitations and chest tightness after smoking a cigarette this evening.  Patient crying in triage; anxious.  States that she uses marijuana, but not in the past few days.

## 2012-11-23 ENCOUNTER — Emergency Department (HOSPITAL_COMMUNITY)
Admission: EM | Admit: 2012-11-23 | Discharge: 2012-11-23 | Disposition: A | Discharge status: Home / Self Care | Payer: Self-pay | Attending: Emergency Medicine | Admitting: Emergency Medicine

## 2012-11-23 ENCOUNTER — Encounter (HOSPITAL_COMMUNITY): Payer: Self-pay | Admitting: *Deleted

## 2012-11-23 ENCOUNTER — Emergency Department (HOSPITAL_COMMUNITY): Payer: Self-pay

## 2012-11-23 ENCOUNTER — Encounter (HOSPITAL_COMMUNITY): Payer: Self-pay | Admitting: Emergency Medicine

## 2012-11-23 DIAGNOSIS — R5381 Other malaise: Secondary | ICD-10-CM | POA: Insufficient documentation

## 2012-11-23 DIAGNOSIS — F419 Anxiety disorder, unspecified: Secondary | ICD-10-CM

## 2012-11-23 DIAGNOSIS — F411 Generalized anxiety disorder: Secondary | ICD-10-CM | POA: Insufficient documentation

## 2012-11-23 DIAGNOSIS — Z79899 Other long term (current) drug therapy: Secondary | ICD-10-CM | POA: Insufficient documentation

## 2012-11-23 DIAGNOSIS — Z7982 Long term (current) use of aspirin: Secondary | ICD-10-CM | POA: Insufficient documentation

## 2012-11-23 DIAGNOSIS — R002 Palpitations: Secondary | ICD-10-CM | POA: Insufficient documentation

## 2012-11-23 DIAGNOSIS — F410 Panic disorder [episodic paroxysmal anxiety] without agoraphobia: Secondary | ICD-10-CM | POA: Insufficient documentation

## 2012-11-23 DIAGNOSIS — R110 Nausea: Secondary | ICD-10-CM | POA: Insufficient documentation

## 2012-11-23 DIAGNOSIS — N644 Mastodynia: Secondary | ICD-10-CM

## 2012-11-23 MED ORDER — HYDROXYZINE HCL 25 MG PO TABS: 25.0000 mg | ORAL_TABLET | Freq: Four times a day (QID) | ORAL | Status: DC | PRN

## 2012-11-23 MED ORDER — ALPRAZOLAM 1 MG PO TABS: 1.0000 mg | ORAL_TABLET | Freq: Three times a day (TID) | ORAL | Status: DC | PRN

## 2012-11-23 MED ORDER — HYDROXYZINE HCL 25 MG PO TABS
50.0000 mg | ORAL_TABLET | Freq: Once | ORAL | Status: AC
  Administered 2012-11-23: 50 mg via ORAL
  Filled 2012-11-23: qty 2

## 2012-11-23 MED ORDER — ALPRAZOLAM 0.5 MG PO TABS
1.0000 mg | ORAL_TABLET | Freq: Once | ORAL | Status: AC
  Administered 2012-11-23: 1 mg via ORAL
  Filled 2012-11-23: qty 2

## 2012-11-23 NOTE — ED Provider Notes (Signed)
History     CSN: [id number]  Arrival date & time 11/22/12  2341   First MD Initiated Contact with Patient 11/23/12 [phone]      Chief Complaint  Patient presents with  . Palpitations    (Consider location/radiation/quality/duration/timing/severity/associated sxs/prior treatment) HPI Kellie Deleon is a 22 y.o. female who describes a history of anxiety without any formal definition, who presents with heart palpitations. Patient was with friends earlier this evening smoking a blunt (black and mild plus marijuana) she was then discussing her chronic right breast pain and became emotional with crying and then afterward she developed rapid heart rate, a sensation that she could not breathe in some right-sided sharp chest pain which has all self resolved.  No vomiting or diarrhea. Some tingling of the hands.  No syncope, no left-sided chest pain, dyspnea has resolved.  She says she used albuterol without any improvement of her shortness of breath at that time however she's not short of breath now. No nausea vomiting or diarrhea. No recent illness, no fevers or chills.  She does have a followup with OB/GYN in 2 days to discuss her right breast pain.  Past Medical History  Diagnosis Date  . No significant past medical history   . Loss of teeth due to extraction     History reviewed. No pertinent past surgical history.  Family History  Problem Relation Age of Onset  . Diabetes Mother   . Hypertension Mother   . Cancer Other     History  Substance Use Topics  . Smoking status: Never Smoker   . Smokeless tobacco: Not on file  . Alcohol Use: Yes     Comment: Occassional Use    OB History    Grav Para Term Preterm Abortions TAB SAB Ect Mult Living                  Review of Systems At least 10pt or greater review of systems completed and are negative except where specified in the HPI.  Allergies  Latex  Home Medications   Current Outpatient Rx  Name  Route  Sig  Dispense   Refill  . ALBUTEROL SULFATE HFA 108 (90 BASE) MCG/ACT IN AERS   Inhalation   Inhale 2 puffs into the lungs every 6 (six) hours as needed. Wheezing/shortness of breath.         . ASPIRIN-ACETAMINOPHEN-CAFFEINE 250-250-65 MG PO TABS   Oral   Take 2 tablets by mouth every 6 (six) hours as needed. For migraine.         Marland Kitchen FAMOTIDINE 20 MG PO TABS   Oral   Take 1 tablet (20 mg total) by mouth daily.   30 tablet   0   . ONDANSETRON HCL 4 MG PO TABS   Oral   Take 1 tablet (4 mg total) by mouth every 6 (six) hours. PRN nausea   12 tablet   0   . ONDANSETRON 4 MG PO TBDP   Oral   Take 1 tablet (4 mg total) by mouth every 8 (eight) hours as needed for nausea.   10 tablet   0     BP 153/93  Temp 97.5 F (36.4 C) (Oral)  Resp 22  SpO2 98%  Physical Exam Breast exam chaperoned by female nurse Nursing notes reviewed.  Electronic medical record reviewed. VITAL SIGNS:   Filed Vitals:   11/22/12 2351 11/23/12 0452  BP: 153/93 115/79  Pulse:  80  Temp: 97.5 F (36.4 C) 97.8  F (36.6 C)  TempSrc: Oral   Resp: 22 18  SpO2: 98% 98%   CONSTITUTIONAL: Awake, oriented, appears non-toxic HENT: Atraumatic, normocephalic, oral mucosa pink and moist, airway patent. Nares patent without drainage. External ears normal. EYES: Conjunctiva clear, EOMI, PERRLA NECK: Trachea midline, non-tender, supple CARDIOVASCULAR: Normal heart rate, Normal rhythm, No murmurs, rubs, gallops PULMONARY/CHEST: Clear to auscultation, no rhonchi, wheezes, or rales. Symmetrical breath sounds. Non-tender. ABDOMINAL: Non-distended, soft, non-tender - no rebound or guarding.  BS normal. Breasts: Normal breast anatomy palpated no focal nodules appreciated. Patient is tender around ducts system around the nipple on the right breast. NEUROLOGIC: Non-focal, moving all four extremities, no gross sensory or motor deficits. EXTREMITIES: No clubbing, cyanosis, or edema SKIN: Warm, Dry, No erythema, No rash  ED Course   Procedures (including critical care time)  Date: 11/23/2012  Rate: 97  Rhythm: normal sinus rhythm  QRS Axis: normal  Intervals: normal  ST/T Wave abnormalities: Patient does have inverted T waves in 3, aVF and V3  Conduction Disutrbances: none  Narrative Interpretation:  Nonspecific T-wave abnormality     Labs Reviewed - No data to display Dg Chest 2 View  11/23/2012  *RADIOLOGY REPORT*  Clinical Data: Chest pain.  Cough and palpitations.  CHEST - 2 VIEW  Comparison: PA and lateral chest 11/05/2012.  Findings: Lungs are clear.  Heart size is normal.  No pneumothorax or pleural fluid.  IMPRESSION: Negative chest.   Original Report Authenticated By: Holley Dexter, M.D.      1. Panic attack   2. Anxiety   3. Breast pain, right       MDM  Kellie Deleon is a 22 y.o. female presenting with likely panic attack. Patient was smoking marijuana and a cigar that time she had an episode of palpitations-this could also easily give her the sensation of palpitations. Patient is occasionally tearful throughout the interview and has symptoms consistent with anxiety.  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 She did have a negative pregnancy test here 2 days ago. Patient is PERC negative-I. do not think his symptoms are suggestive of a pulmonary embolism at this time.  I explained the diagnosis and have given explicit precautions to return to the ER including any other new or worsening symptoms. The patient understands and accepts the medical plan as it's been dictated and I have answered their questions. Discharge instructions concerning home care and prescriptions have been given.  The patient is STABLE and is discharged to home in good condition.          Jones Skene, MD 11/23/12 8119

## 2012-11-23 NOTE — ED Notes (Signed)
Pt states that she has been seen in the E.D. Previously for "boob pain" pt states that she knows now that it is not her boob that is hurting but her muscle around her breast into her right shoulder and down to her back. Pt states that when she moves the pain gets worse. Pt states she has never been diagnosed with anxiety.

## 2012-11-23 NOTE — ED Notes (Signed)
Pt reports feeling anxious and in panic state since last night. Reports was seen at [facility] and given a prescription for hydroxyzine that made symptoms worse. Pt reports feeling "like I'm going to pass out and butterfly feeling in abdomen."

## 2012-11-23 NOTE — ED Provider Notes (Signed)
History   This chart was scribed for Kellie Gourd, PA-C working with Kellie Kras, MD by Charolett Bumpers, ED Scribe. This patient was seen in room WTR8/WTR8 and the patient's care was started at 1906.   CSN: [id number]  Arrival date & time 11/23/12  1846   First MD Initiated Contact with Patient 11/23/12 1906      Chief Complaint  Patient presents with  . Anxiety    The history is provided by the patient. No language interpreter was used.  Kellie Deleon is a 22 y.o. female who presents to the Emergency Department with her mom and boyfriend complaining of constant, gradually worsening anxiety with associated palpitations, fatigue and nausea. She states that she was last seen last night at [facility] ED for anxiety. An EKG and chest x-ray was obtained at that time which were normal. She was given Hydroxyzine which she reports made her symptoms worse. She reports being under increased stress at home. She states that she is concerned with having breast pain which may have set off her anxiety. She lives with her boyfriend. She denies any h/o anxiety or panic attacks. She denies having any anxiety prior to the current episode. She states that she has an appointment with OB/GYN tomorrow to examine her breast pain. She states her breast was elevated in ED yesterday which was normal. LNMP was in August of last year. Pregnancy was negative yesterday.   Past Medical History  Diagnosis Date  . No significant past medical history   . Loss of teeth due to extraction     History reviewed. No pertinent past surgical history.  Family History  Problem Relation Age of Onset  . Diabetes Mother   . Hypertension Mother   . Cancer Other     History  Substance Use Topics  . Smoking status: Never Smoker   . Smokeless tobacco: Not on file  . Alcohol Use: Yes     Comment: Occassional Use    OB History    Grav Para Term Preterm Abortions TAB SAB Ect Mult Living                  Review of  Systems  Constitutional: Positive for fatigue.  Cardiovascular: Positive for palpitations.  Gastrointestinal: Positive for nausea.  Psychiatric/Behavioral: The patient is nervous/anxious.   All other systems reviewed and are negative.    Allergies  Latex  Home Medications   Current Outpatient Rx  Name  Route  Sig  Dispense  Refill  . ALBUTEROL SULFATE HFA 108 (90 BASE) MCG/ACT IN AERS   Inhalation   Inhale 2 puffs into the lungs every 6 (six) hours as needed. Wheezing/shortness of breath.         . ASPIRIN-ACETAMINOPHEN-CAFFEINE 250-250-65 MG PO TABS   Oral   Take 2 tablets by mouth every 6 (six) hours as needed. For migraine.         Marland Kitchen FAMOTIDINE 20 MG PO TABS   Oral   Take 1 tablet (20 mg total) by mouth daily.   30 tablet   0   . HYDROXYZINE HCL 25 MG PO TABS   Oral   Take 25 mg by mouth every 6 (six) hours as needed. Anxiety           BP 129/71  Pulse 97  Temp 98 F (36.7 C) (Oral)  Resp 16  SpO2 100%  LMP 07/15/2012  Physical Exam  Nursing note and vitals reviewed. Constitutional: She is oriented to  person, place, and time. She appears well-developed and well-nourished. No distress.       Tearful, anxious.   HENT:  Head: Normocephalic and atraumatic.  Eyes: Conjunctivae normal and EOM are normal.  Neck: Neck supple. No tracheal deviation present.  Cardiovascular: Regular rhythm and normal heart sounds.  Tachycardia present.   No murmur heard. Pulmonary/Chest: Effort normal and breath sounds normal. No respiratory distress.  Abdominal: Soft. There is no tenderness.  Musculoskeletal: Normal range of motion.  Neurological: She is alert and oriented to person, place, and time.  Skin: Skin is warm and dry.  Psychiatric: Her behavior is normal. Her mood appears anxious.    ED Course  Procedures (including critical care time)  DIAGNOSTIC STUDIES: Oxygen Saturation is 100% on room air, normal by my interpretation.    COORDINATION OF  CARE:  19:28-Discussed planned course of treatment with the patient including Ativan here in ED and re-evaluation, who is agreeable at this time.   19:30-Medication Orders: Alprazolam (Xanax) tablet 1 mg-once.    1. Panic attack   2. Anxiety       MDM  22 y/o female with anxiety. Symptoms worse with Atarax prescribed yesterday. She is very anxious about her appointment with the GYN tomorrow. Xanax given in ED. She is feeling more calm and feels as if she can go home. Rx Xanax #15. She will f/u with GYN tomorrow and resource guide given to establish care with PCP. Return precautions discussed. She will be going home to stay with her mother tonight. Stable for discharge.    I personally performed the services described in this documentation, which was scribed in my presence. The recorded information has been reviewed and is accurate.     Trevor Mace, PA-C 11/23/12 2110

## 2012-11-23 NOTE — ED Provider Notes (Signed)
Medical screening examination/treatment/procedure(s) were performed by non-physician practitioner and as supervising physician I was immediately available for consultation/collaboration.   Celene Kras, MD 11/23/12 2116

## 2012-11-24 ENCOUNTER — Encounter: Payer: Self-pay | Admitting: Medical

## 2012-11-24 ENCOUNTER — Ambulatory Visit (INDEPENDENT_AMBULATORY_CARE_PROVIDER_SITE_OTHER): Payer: Self-pay | Admitting: Medical

## 2012-11-24 VITALS — BP 122/81 | HR 100 | Temp 97.9°F | Ht 65.0 in | Wt 191.0 lb

## 2012-11-24 DIAGNOSIS — F419 Anxiety disorder, unspecified: Secondary | ICD-10-CM | POA: Insufficient documentation

## 2012-11-24 DIAGNOSIS — F411 Generalized anxiety disorder: Secondary | ICD-10-CM

## 2012-11-24 DIAGNOSIS — Z113 Encounter for screening for infections with a predominantly sexual mode of transmission: Secondary | ICD-10-CM

## 2012-11-24 DIAGNOSIS — N926 Irregular menstruation, unspecified: Secondary | ICD-10-CM

## 2012-11-24 LAB — HEPATITIS B SURFACE ANTIGEN: Hepatitis B Surface Ag: NEGATIVE

## 2012-11-24 LAB — HIV ANTIBODY (ROUTINE TESTING W REFLEX): HIV: NONREACTIVE

## 2012-11-24 LAB — HEPATITIS C ANTIBODY: HCV Ab: NEGATIVE

## 2012-11-24 NOTE — Patient Instructions (Signed)
Polycystic Ovarian Syndrome Polycystic ovarian syndrome is a condition with a number of problems. One problem is with the ovaries. The ovaries are organs located in the female pelvis, on each side of the uterus. Usually, during the menstrual cycle, an egg is released from 1 ovary every month. This is called ovulation. When the egg is fertilized, it goes into the womb (uterus), which allows for the growth of a baby. The egg travels from the ovary through the fallopian tube to the uterus. The ovaries also make the hormones estrogen and progesterone. These hormones help the development of a woman's breasts, body shape, and body hair. They also regulate the menstrual cycle and pregnancy. Sometimes, cysts form in the ovaries. A cyst is a fluid-filled sac. On the ovary, different types of cysts can form. The most common type of ovarian cyst is called a functional or ovulation cyst. It is normal, and often forms during the normal menstrual cycle. Each month, a woman's ovaries grow tiny cysts that hold the eggs. When an egg is fully grown, the sac breaks open. This releases the egg. Then, the sac which released the egg from the ovary dissolves. In one type of functional cyst, called a follicle cyst, the sac does not break open to release the egg. It may actually continue to grow. This type of cyst usually disappears within 1 to 3 months.  One type of cyst problem with the ovaries is called Polycystic Ovarian Syndrome (PCOS). In this condition, many follicle cysts form, but do not rupture and produce an egg. This health problem can affect the following:  Menstrual cycle.  Heart.  Obesity.  Cancer of the uterus.  Fertility.  Blood vessels.  Hair growth (face and body) or baldness.  Hormones.  Appearance.  High blood pressure.  Stroke.  Insulin production.  Inflammation of the liver.  Elevated blood cholesterol and triglycerides. CAUSES   No one knows the exact cause of PCOS.  Women with  PCOS often have a mother or sister with PCOS. There is not yet enough proof to say this is inherited.  Many women with PCOS have a weight problem.  Researchers are looking at the relationship between PCOS and the body's ability to make insulin. Insulin is a hormone that regulates the change of sugar, starches, and other food into energy for the body's use, or for storage. Some women with PCOS make too much insulin. It is possible that the ovaries react by making too many female hormones, called androgens. This can lead to acne, excessive hair growth, weight gain, and ovulation problems.  Too much production of luteinizing hormone (LH) from the pituitary gland in the brain stimulates the ovary to produce too much female hormone (androgen). SYMPTOMS   Infrequent or no menstrual periods, and/or irregular bleeding.  Inability to get pregnant (infertility), because of not ovulating.  Increased growth of hair on the face, chest, stomach, back, thumbs, thighs, or toes.  Acne, oily skin, or dandruff.  Pelvic pain.  Weight gain or obesity, usually carrying extra weight around the waist.  Type 2 diabetes (this is the diabetes that usually does not need insulin).  High cholesterol.  High blood pressure.  Female-pattern baldness or thinning hair.  Patches of thickened and dark brown or black skin on the neck, arms, breasts, or thighs.  Skin tags, or tiny excess flaps of skin, in the armpits or neck area.  Sleep apnea (excessive snoring and breathing stops at times while asleep).  Deepening of the voice.    Gestational diabetes when pregnant.  Increased risk of miscarriage with pregnancy. DIAGNOSIS  There is no single test to diagnose PCOS.   Your caregiver will:  Take a medical history.  Perform a pelvic exam.  Perform an ultrasound.  Check your female and female hormone levels.  Measure glucose or sugar levels in the blood.  Do other blood tests.  If you are producing too many  female hormones, your caregiver will make sure it is from PCOS. At the physical exam, your caregiver will want to evaluate the areas of increased hair growth. Try to allow natural hair growth for a few days before the visit.  During a pelvic exam, the ovaries may be enlarged or swollen by the increased number of small cysts. This can be seen more easily by vaginal ultrasound or screening, to examine the ovaries and lining of the uterus (endometrium) for cysts. The uterine lining may become thicker, if there has not been a regular period. TREATMENT  Because there is no cure for PCOS, it needs to be managed to prevent problems. Treatments are based on your symptoms. Treatment is also based on whether you want to have a baby or whether you need contraception.  Treatment may include:  Progesterone hormone, to start a menstrual period.  Birth control pills, to make you have regular menstrual periods.  Medicines to make you ovulate, if you want to get pregnant.  Medicines to control your insulin.  Medicine to control your blood pressure.  Medicine and diet, to control your high cholesterol and triglycerides in your blood.  Surgery, making small holes in the ovary, to decrease the amount of female hormone production. This is done through a long, lighted tube (laparoscope), placed into the pelvis through a tiny incision in the lower abdomen. Your caregiver will go over some of the choices with you. WOMEN WITH PCOS HAVE THESE CHARACTERISTICS:  High levels of female hormones called androgens.  An irregular or no menstrual cycle.  May have many small cysts in their ovaries. PCOS is the most common hormonal reproductive problem in women of childbearing age. WHY DO WOMEN WITH PCOS HAVE TROUBLE WITH THEIR MENSTRUAL CYCLE? Each month, about 20 eggs start to mature in the ovaries. As one egg grows and matures, the follicle breaks open to release the egg, so it can travel through the fallopian tube for  fertilization. When the single egg leaves the follicle, ovulation takes place. In women with PCOS, the ovary does not make all of the hormones it needs for any of the eggs to fully mature. They may start to grow and accumulate fluid, but no one egg becomes large enough. Instead, some may remain as cysts. Since no egg matures or is released, ovulation does not occur and the hormone progesterone is not made. Without progesterone, a woman's menstrual cycle is irregular or absent. Also, the cysts produce female hormones, which continue to prevent ovulation.  Document Released: 02/26/2005 Document Revised: 01/25/2012 Document Reviewed: 09/20/2009 [employer] Patient Information 2013 [employer].  

## 2012-11-24 NOTE — Progress Notes (Signed)
Subjective:     Patient ID: Kellie Deleon, female   DOB: June 28, 1991, 22 y.o.   MRN: [id number]  HPI [name] is a 22 y.o. G0 who presents to clinic today with multiple complaints. The patient states that she has always had irregular periods and was told that she may have PCOS but never followed-up here to discuss treatment. The patient states that she had onset of menses at age 22. That period was light and lasted only a few days. She has only had ~2 periods throughout her lifetime that were not a results of a provera challenge or using OCPs. The patient desires pregnancy. Her LMP was in August. She had a negative HPT yesterday. She was seen in the ED twice in the last two days for anxiety/panic attacks. The patient also desires STD testing today. The patient also expresses concerns about breast tenderness. She wants to be sure that she doesn't have cancer and doesn't understand why no other providers have ever sent her for imaging of her breasts.   The patient later asks what kind of health issues could come from having an eating disorder. The patient states that she thinks that she might have an eating disorder. She states that she has recently lost quite a bit of weight because she was unhappy with her body image. She states that she only eats once a day and often feels nauseous after eating. She does not purge. She denies SI at this time.   Review of Systems All negative unless otherwise noted in HPI    Objective:   Physical Exam  Constitutional: She is oriented to person, place, and time. She appears well-developed and well-nourished. No distress.  HENT:  Head: Normocephalic and atraumatic.  Cardiovascular: Normal rate, regular rhythm and normal heart sounds.  Exam reveals no gallop and no friction rub.   No murmur heard. Pulmonary/Chest: Effort normal and breath sounds normal. No respiratory distress.  Abdominal: Soft. Bowel sounds are normal. She exhibits no distension and no  mass. There is no tenderness. There is no rebound and no guarding.  Genitourinary: Vagina normal. There is breast tenderness. No breast swelling or discharge. Uterus is not enlarged and not tender. Cervix exhibits discharge (small amount of thin white discharge). Cervix exhibits no motion tenderness and no friability. Right adnexum displays no mass and no tenderness. Left adnexum displays no mass and no tenderness.  Neurological: She is alert and oriented to person, place, and time.  Skin: Skin is warm and dry. No erythema.       Patient exhibits hirsutism.   Psychiatric: She has a normal mood and affect.       Assessment:     PCOS Breast tenderness Irregular menses ?eating disorder    Plan:     Aptima swab obtained, HIV, RPR, Hep B, Hep C sent TSH, HgbA1c, FSH sent Patient information given to SW. They will contact patient with resources and information about counseling for eating habits and anxiety. Patient will return to [facility] for 2nd and 3rd Gardasil as scheduled Patient will follow-up in clinic in ~ 2 weeks to discuss lab results and management options     Freddi Starr, PA-C 11/24/2012 5:10 PM

## 2012-11-25 LAB — TESTOSTERONE, FREE, TOTAL, SHBG
Testosterone, Free: 13.6 pg/mL — ABNORMAL HIGH (ref 0.6–6.8)
Testosterone-% Free: 1.8 % (ref 0.4–2.4)

## 2012-11-26 ENCOUNTER — Emergency Department (INDEPENDENT_AMBULATORY_CARE_PROVIDER_SITE_OTHER)
Admission: EM | Admit: 2012-11-26 | Discharge: 2012-11-26 | Disposition: A | Discharge status: Home / Self Care | Payer: Self-pay | Source: Home / Self Care | Attending: Family Medicine | Admitting: Family Medicine

## 2012-11-26 ENCOUNTER — Encounter (HOSPITAL_COMMUNITY): Payer: Self-pay | Admitting: Emergency Medicine

## 2012-11-26 DIAGNOSIS — F411 Generalized anxiety disorder: Secondary | ICD-10-CM

## 2012-11-26 DIAGNOSIS — F419 Anxiety disorder, unspecified: Secondary | ICD-10-CM

## 2012-11-26 HISTORY — DX: Polycystic ovarian syndrome: E28.2

## 2012-11-26 NOTE — ED Notes (Signed)
Waiting discharge papers 

## 2012-11-26 NOTE — ED Notes (Signed)
Pt c/o palpitations last night and she took albuterol. Pt was recently seen in ER and prescribed xanax. Pt also c/o vomiting/diarrhea and fever. Productive cough that is white and froathy.   Pt states symptoms started on Tuesday after smoking MJ. Pt does use MJ daily until Tuesday. Pt ? If it was laced with something or if she is having withdrawal. Pt states that she feel pain through out upper body.  Pt overall states she feels confused.

## 2012-11-26 NOTE — ED Notes (Signed)
Was asked by front office staff to assess pt for palpitations. Pt states she was seen at the ER on 11/23/12 and was given Xanax Had it last night and also took albuterol "for rapid palpitations" Adv pt that albuterol will increase her HR.  Sx include: nauseas, fevers, vomiting, diarrhea  She is alert w/no signs of acute distress.  Adv pt to notify the front staff if sx change while waiting.

## 2012-11-28 NOTE — ED Provider Notes (Signed)
History     CSN: [id number]  Arrival date & time 11/26/12  1340   First MD Initiated Contact with Patient 11/26/12 1342      Chief Complaint  Patient presents with  . Palpitations    was seen 1/81/14 in er and given xanax. pt c/o palpitations last night and took albuterol. pt also c/o fever, vomiting and diarrhea    (Consider location/radiation/quality/duration/timing/severity/associated sxs/prior treatment) HPI Comments: 22 year old female with history of anxiety and panic attacks. Here complaining of recurrent episodes of heart palpitations during the last week. Patient feels worried all the time and is tearful all the time. She is here with her mother. Patient reports that she has to sleep with her mother because she has the feeling that something bad could happen to her. Patient states that she has used leftover albuterol inhaler to help with shortness of breath and palpitations, but this is making her symptoms worse. Patient stated she had symptoms of onset in the past but never had panic attacks like this before. Patient states her symptoms started about 3 days ago after smoking marijuana. Denies current shortness of breath or chest pain. Has had nausea, food content emesis and loose stools last time 2 days ago. Denies abdominal pain currently. No dysuria or hematuria.     Past Medical History  Diagnosis Date  . No significant past medical history   . Loss of teeth due to extraction   . PCOS (polycystic ovarian syndrome)     History reviewed. No pertinent past surgical history.  Family History  Problem Relation Age of Onset  . Diabetes Mother   . Hypertension Mother   . Cancer Other     History  Substance Use Topics  . Smoking status: Never Smoker   . Smokeless tobacco: Not on file  . Alcohol Use: Yes     Comment: Occassional Use    OB History    Grav Para Term Preterm Abortions TAB SAB Ect Mult Living   0 0 0 0 0 0 0 0 0 0       Review of Systems    Constitutional: Negative for fever and chills.  HENT: Positive for congestion.   Eyes: Negative for discharge.  Respiratory: Positive for shortness of breath. Negative for wheezing.   Cardiovascular: Positive for palpitations.  Genitourinary: Negative for dysuria and urgency.  Skin: Negative for rash.  Neurological: Negative for dizziness and headaches.  Psychiatric/Behavioral: Positive for sleep disturbance and dysphoric mood. Negative for suicidal ideas, hallucinations and self-injury. The patient is nervous/anxious. The patient is not hyperactive.     Allergies  Latex  Home Medications   Current Outpatient Rx  Name  Route  Sig  Dispense  Refill  . ALPRAZOLAM 1 MG PO TABS   Oral   Take 1 tablet (1 mg total) by mouth 3 (three) times daily as needed for sleep.   15 tablet   0   . ASPIRIN-ACETAMINOPHEN-CAFFEINE 250-250-65 MG PO TABS   Oral   Take 2 tablets by mouth every 6 (six) hours as needed. For migraine.         Marland Kitchen FAMOTIDINE 20 MG PO TABS   Oral   Take 1 tablet (20 mg total) by mouth daily.   30 tablet   0   . HYDROXYZINE HCL 25 MG PO TABS   Oral   Take 25 mg by mouth every 6 (six) hours as needed. Anxiety           BP 138/85  Pulse  98  Temp 98.2 F (36.8 C) (Oral)  Resp 20  SpO2 100%  LMP 07/15/2012  Physical Exam  Nursing note and vitals reviewed. Constitutional: She is oriented to person, place, and time. She appears well-developed and well-nourished. No distress.       Tearful, anxious  HENT:  Head: Normocephalic and atraumatic.  Mouth/Throat: Oropharynx is clear and moist. No oropharyngeal exudate.       Congestion and erythema of nasal turbinates. No rhinorrhea  Eyes: Conjunctivae normal and EOM are normal. Pupils are equal, round, and reactive to light. No scleral icterus.  Neck: Neck supple. No JVD present. No thyromegaly present.  Cardiovascular: Normal rate, regular rhythm, normal heart sounds and intact distal pulses.  Exam reveals no  gallop and no friction rub.   No murmur heard. Pulmonary/Chest: Effort normal and breath sounds normal. No respiratory distress. She has no wheezes. She has no rales. She exhibits no tenderness.  Abdominal: Soft. There is no tenderness.  Lymphadenopathy:    She has no cervical adenopathy.  Neurological: She is alert and oriented to person, place, and time.  Skin: No rash noted. She is not diaphoretic.  Psychiatric: Her speech is normal and behavior is normal. Judgment and thought content normal. Her mood appears anxious. Cognition and memory are normal.    ED Course  Procedures (including critical care time)  Labs Reviewed - No data to display No results found.   1. Anxiety       MDM  Impress mood disorder/panic attack. Discussed with patient biofeedback/relaxation techniques. Patient felt better just by reassuring her that her physical exam was normal. She has had extensive workup done this week at the emergency department. Recent normal TSH normal. No new testing done today. Patient had hydroxyzine prescriptions on prior hospital visit 3 days ago. No new prescriptions given today. Behavioral health referral provided. Asked to go to [facility] emergency department if worsening symptoms despite following treatment.         Sharin Grave, MD 12/01/12 [phone]

## 2012-12-01 ENCOUNTER — Telehealth: Payer: Self-pay | Admitting: *Deleted

## 2012-12-01 ENCOUNTER — Other Ambulatory Visit: Payer: Self-pay | Admitting: Medical

## 2012-12-01 DIAGNOSIS — N760 Acute vaginitis: Secondary | ICD-10-CM

## 2012-12-01 MED ORDER — METRONIDAZOLE 500 MG PO TABS: 500.0000 mg | ORAL_TABLET | Freq: Two times a day (BID) | ORAL | Status: DC

## 2012-12-01 NOTE — Telephone Encounter (Signed)
Called pt and informed her of +BV and Rx prescribed.  Pt states that she gets this condition often. I advised her to discuss further @ her next appt on 12/09/12 @ 0815. She may benefit from prevention Rx. Also, pt stated that she is still having breast discomfort. I advised that she ask more about this at her visit on 1/24 as well.  Pt agreed and voiced understanding of all information and advice given.

## 2012-12-01 NOTE — Telephone Encounter (Signed)
Message copied by Jill Side on Thu Dec 01, 2012  1:26 PM ------      Message from: Freddi Starr      Created: Thu Dec 01, 2012  1:17 PM       Patient has BV. I have sent Rx to pharmacy. Please inform patient of dx and Rx.             Thanks!

## 2012-12-09 ENCOUNTER — Ambulatory Visit: Payer: Self-pay | Admitting: Medical

## 2013-02-14 ENCOUNTER — Encounter (HOSPITAL_BASED_OUTPATIENT_CLINIC_OR_DEPARTMENT_OTHER): Payer: Self-pay | Admitting: *Deleted

## 2013-02-14 ENCOUNTER — Emergency Department (HOSPITAL_BASED_OUTPATIENT_CLINIC_OR_DEPARTMENT_OTHER)
Admission: EM | Admit: 2013-02-14 | Discharge: 2013-02-14 | Disposition: A | Discharge status: Home / Self Care | Payer: Self-pay | Attending: Emergency Medicine | Admitting: Emergency Medicine

## 2013-02-14 DIAGNOSIS — B86 Scabies: Secondary | ICD-10-CM | POA: Insufficient documentation

## 2013-02-14 DIAGNOSIS — Z79899 Other long term (current) drug therapy: Secondary | ICD-10-CM | POA: Insufficient documentation

## 2013-02-14 DIAGNOSIS — Z862 Personal history of diseases of the blood and blood-forming organs and certain disorders involving the immune mechanism: Secondary | ICD-10-CM | POA: Insufficient documentation

## 2013-02-14 DIAGNOSIS — R51 Headache: Secondary | ICD-10-CM | POA: Insufficient documentation

## 2013-02-14 DIAGNOSIS — Z8639 Personal history of other endocrine, nutritional and metabolic disease: Secondary | ICD-10-CM | POA: Insufficient documentation

## 2013-02-14 MED ORDER — KETOROLAC TROMETHAMINE 30 MG/ML IJ SOLN
60.0000 mg | Freq: Once | INTRAMUSCULAR | Status: AC
  Administered 2013-02-14: 60 mg via INTRAMUSCULAR
  Filled 2013-02-14: qty 2

## 2013-02-14 MED ORDER — METOCLOPRAMIDE HCL 5 MG/ML IJ SOLN
10.0000 mg | Freq: Once | INTRAMUSCULAR | Status: AC
  Administered 2013-02-14: 10 mg via INTRAMUSCULAR
  Filled 2013-02-14: qty 2

## 2013-02-14 MED ORDER — PERMETHRIN 5 % EX CREA: TOPICAL_CREAM | CUTANEOUS | Status: DC

## 2013-02-14 NOTE — ED Notes (Signed)
Headache x 3 days. Hx of the same.

## 2013-02-14 NOTE — ED Provider Notes (Signed)
History     CSN: [id number]  Arrival date & time 02/14/13  1549   First MD Initiated Contact with Patient 02/14/13 1558      Chief Complaint  Patient presents with  . Headache    (Consider location/radiation/quality/duration/timing/severity/associated sxs/prior treatment) HPI Comments: Pt states that she was given naproxen in the past for her migraines but she got rid of them because of the side effects:pt states that she has also noted a itchy rash on her hands and along her waist  Patient is a 22 y.o. female presenting with headaches. The history is provided by the patient. No language interpreter was used.  Headache Pain location:  Generalized Quality:  Unable to specify Radiates to:  Does not radiate Onset quality:  Gradual Timing:  Constant Progression:  Unchanged Chronicity:  Recurrent Similar to prior headaches: yes   Context: not eating, not loud noise and not straining   Relieved by:  Nothing Worsened by:  Nothing tried Ineffective treatments:  None tried Associated symptoms: no blurred vision, no congestion, no fever, no focal weakness, no hearing loss, no sinus pressure and no sore throat     Past Medical History  Diagnosis Date  . No significant past medical history   . Loss of teeth due to extraction   . PCOS (polycystic ovarian syndrome)     History reviewed. No pertinent past surgical history.  Family History  Problem Relation Age of Onset  . Diabetes Mother   . Hypertension Mother   . Cancer Other     History  Substance Use Topics  . Smoking status: Never Smoker   . Smokeless tobacco: Not on file  . Alcohol Use: Yes     Comment: Occassional Use    OB History   Grav Para Term Preterm Abortions TAB SAB Ect Mult Living   0 0 0 0 0 0 0 0 0 0       Review of Systems  Constitutional: Negative for fever.  HENT: Negative for hearing loss, congestion, sore throat and sinus pressure.   Eyes: Negative for blurred vision.  Respiratory: Negative.    Cardiovascular: Negative.   Neurological: Positive for headaches. Negative for focal weakness.    Allergies  Latex  Home Medications   Current Outpatient Rx  Name  Route  Sig  Dispense  Refill  . ALPRAZolam (XANAX) 1 MG tablet   Oral   Take 1 tablet (1 mg total) by mouth 3 (three) times daily as needed for sleep.   15 tablet   0   . aspirin-acetaminophen-caffeine (EXCEDRIN MIGRAINE) 250-250-65 MG per tablet   Oral   Take 2 tablets by mouth every 6 (six) hours as needed. For migraine.         . famotidine (PEPCID) 20 MG tablet   Oral   Take 1 tablet (20 mg total) by mouth daily.   30 tablet   0   . hydrOXYzine (ATARAX/VISTARIL) 25 MG tablet   Oral   Take 25 mg by mouth every 6 (six) hours as needed. Anxiety         . metroNIDAZOLE (FLAGYL) 500 MG tablet   Oral   Take 1 tablet (500 mg total) by mouth 2 (two) times daily.   14 tablet   0     BP 122/79  Pulse 88  Temp(Src) 98.4 F (36.9 C) (Oral)  Resp 20  Wt 191 lb (86.637 kg)  BMI 31.78 kg/m2  SpO2 98%  LMP 12/17/2012  Physical Exam  Nursing  note and vitals reviewed. Constitutional: She is oriented to person, place, and time. She appears well-developed and well-nourished.  HENT:  Head: Normocephalic and atraumatic.  Eyes: Conjunctivae and EOM are normal. Pupils are equal, round, and reactive to light.  Neck: Normal range of motion. Neck supple.  Cardiovascular: Normal rate and regular rhythm.   Pulmonary/Chest: Effort normal and breath sounds normal.  Musculoskeletal: Normal range of motion.  Neurological: She is alert and oriented to person, place, and time. Coordination normal.  Skin:  Raised areas to the webs of finger and waist line  Psychiatric: She has a normal mood and affect.    ED Course  Procedures (including critical care time)  Labs Reviewed - No data to display No results found.   1. Headache   2. Scabies       MDM  Pt headache is resolved a this time:rash consistent with  scabies:will treat        Teressa Lower, NP 02/14/13 1742

## 2013-02-15 NOTE — ED Provider Notes (Signed)
Medical screening examination/treatment/procedure(s) were performed by non-physician practitioner and as supervising physician I was immediately available for consultation/collaboration.  Zoe Goonan, MD 02/15/13 0904 

## 2013-05-02 ENCOUNTER — Encounter (HOSPITAL_COMMUNITY): Payer: Self-pay | Admitting: Emergency Medicine

## 2013-05-02 ENCOUNTER — Emergency Department (HOSPITAL_COMMUNITY)
Admission: EM | Admit: 2013-05-02 | Discharge: 2013-05-02 | Disposition: A | Discharge status: Home / Self Care | Payer: Self-pay | Attending: Emergency Medicine | Admitting: Emergency Medicine

## 2013-05-02 DIAGNOSIS — R0982 Postnasal drip: Secondary | ICD-10-CM | POA: Insufficient documentation

## 2013-05-02 DIAGNOSIS — J029 Acute pharyngitis, unspecified: Secondary | ICD-10-CM | POA: Insufficient documentation

## 2013-05-02 DIAGNOSIS — J3489 Other specified disorders of nose and nasal sinuses: Secondary | ICD-10-CM | POA: Insufficient documentation

## 2013-05-02 DIAGNOSIS — Z91040 Latex allergy status: Secondary | ICD-10-CM | POA: Insufficient documentation

## 2013-05-02 DIAGNOSIS — Z8742 Personal history of other diseases of the female genital tract: Secondary | ICD-10-CM | POA: Insufficient documentation

## 2013-05-02 LAB — RAPID STREP SCREEN (MED CTR MEBANE ONLY): Streptococcus, Group A Screen (Direct): NEGATIVE

## 2013-05-02 NOTE — ED Provider Notes (Signed)
Medical screening examination/treatment/procedure(s) were performed by non-physician practitioner and as supervising physician I was immediately available for consultation/collaboration.  Jessamyn Watterson, MD 05/02/13 1650 

## 2013-05-02 NOTE — ED Notes (Signed)
Pt states she has had sore throat for past month that has gotten progressively worse.  Pt states has grey mucus on throat.  Pt states it hurts to swallow.

## 2013-05-02 NOTE — ED Notes (Signed)
Voiced understanding of the instructions given

## 2013-05-02 NOTE — ED Provider Notes (Signed)
History     CSN: [id number]  Arrival date & time 05/02/13  1029   First MD Initiated Contact with Patient 05/02/13 1057      Chief Complaint  Patient presents with  . Sore Throat    (Consider location/radiation/quality/duration/timing/severity/associated sxs/prior treatment) HPI Comments: Patient reports that she has had a sore throat intermittently over the past month.  Pain became worse yesterday.  She reports that she has also had congestion, rhinorrhea, and post nasal drip.  She describes the pain as a "scratchy" throat.  She reports that she had a cough last week, but the cough had resolved at this time.  She took Mucinex for her symptoms last week, which helped.  However, she has not taken anything for her symptoms in the past week.  She denies fever or chills.  Denies sinus pain or pressure.  Pain increased with swallowing, but no difficulty swallowing.    Patient is a 22 y.o. female presenting with pharyngitis. The history is provided by the patient.  Sore Throat Associated symptoms include congestion and a sore throat. Pertinent negatives include no fever, headaches, neck pain, rash or vomiting.    Past Medical History  Diagnosis Date  . No significant past medical history   . Loss of teeth due to extraction   . PCOS (polycystic ovarian syndrome)     History reviewed. No pertinent past surgical history.  Family History  Problem Relation Age of Onset  . Diabetes Mother   . Hypertension Mother   . Cancer Other     History  Substance Use Topics  . Smoking status: Never Smoker   . Smokeless tobacco: Not on file  . Alcohol Use: Yes     Comment: Occassional Use    OB History   Grav Para Term Preterm Abortions TAB SAB Ect Mult Living   0 0 0 0 0 0 0 0 0 0       Review of Systems  Constitutional: Negative for fever.  HENT: Positive for congestion and sore throat. Negative for neck pain.   Gastrointestinal: Negative for vomiting.  Skin: Negative for rash.   Neurological: Negative for headaches.    Allergies  Latex  Home Medications   Current Outpatient Rx  Name  Route  Sig  Dispense  Refill  . aspirin-acetaminophen-caffeine (EXCEDRIN MIGRAINE) 250-250-65 MG per tablet   Oral   Take 2 tablets by mouth every 6 (six) hours as needed. For migraine.           BP 118/69  Pulse 84  Temp(Src) 98.3 F (36.8 C) (Oral)  Resp 16  SpO2 99%  Physical Exam  Nursing note and vitals reviewed. Constitutional: She appears well-developed and well-nourished.  HENT:  Head: Normocephalic and atraumatic.  Right Ear: Tympanic membrane and ear canal normal.  Left Ear: Tympanic membrane and ear canal normal.  Nose: Mucosal edema and rhinorrhea present. Right sinus exhibits no maxillary sinus tenderness and no frontal sinus tenderness. Left sinus exhibits no maxillary sinus tenderness and no frontal sinus tenderness.  Mouth/Throat: Uvula is midline, oropharynx is clear and moist and mucous membranes are normal.  Patient handling secretions well Normal voice phonation  Neck: Normal range of motion. Neck supple.  Cardiovascular: Normal rate, regular rhythm and normal heart sounds.   Pulmonary/Chest: Effort normal and breath sounds normal.  Lymphadenopathy:    She has no cervical adenopathy.  Neurological: She is alert.  Skin: Skin is warm and dry.  Psychiatric: She has a normal mood and affect.  ED Course  Procedures (including critical care time)  Labs Reviewed  RAPID STREP SCREEN  CULTURE, GROUP A STREP   No results found.   No diagnosis found.    MDM  Patient presents with a sore throat that has been present intermittently over the past month.  Patient afebrile.  Rapid strep negative.  No signs of peritonsillar abscess.  Patient stable for discharge.        Pascal Lux [place], PA-C 05/02/13 [phone]

## 2013-05-02 NOTE — Progress Notes (Signed)
[facility] has seen patient and provided her with a list of primary care resources. °

## 2013-05-03 LAB — CULTURE, GROUP A STREP

## 2013-07-27 ENCOUNTER — Inpatient Hospital Stay (HOSPITAL_COMMUNITY)
Admission: AD | Admit: 2013-07-27 | Discharge: 2013-07-28 | Disposition: A | Discharge status: Home / Self Care | Payer: Medicaid Other | Source: Ambulatory Visit | Attending: Obstetrics & Gynecology | Admitting: Obstetrics & Gynecology

## 2013-07-27 ENCOUNTER — Encounter (HOSPITAL_COMMUNITY): Payer: Self-pay | Admitting: *Deleted

## 2013-07-27 DIAGNOSIS — Z3201 Encounter for pregnancy test, result positive: Secondary | ICD-10-CM

## 2013-07-27 NOTE — MAU Note (Signed)
PT SAYS SHE STARTED HAVING CRAMPS   ON 8-30.   LAST SEX-   9-7.    NO BIRTH CONTROL.  NO BLEEDING NOW.    SAYS HAS SORE BREAST -  STARTED  ON 8-30.    DID HOME PREG TEST-   POSTIVE.

## 2013-07-28 ENCOUNTER — Encounter (HOSPITAL_COMMUNITY): Payer: Self-pay | Admitting: *Deleted

## 2013-07-28 DIAGNOSIS — Z3201 Encounter for pregnancy test, result positive: Secondary | ICD-10-CM

## 2013-07-28 LAB — CBC
HCT: 37.8 % (ref 36.0–46.0)
Hemoglobin: 12.9 g/dL (ref 12.0–15.0)
MCH: 30.3 pg (ref 26.0–34.0)
MCV: 88.7 fL (ref 78.0–100.0)
Platelets: 289 10*3/uL (ref 150–400)
RBC: 4.26 MIL/uL (ref 3.87–5.11)
WBC: 10.4 10*3/uL (ref 4.0–10.5)

## 2013-07-28 LAB — GC/CHLAMYDIA PROBE AMP
CT Probe RNA: NEGATIVE
GC Probe RNA: NEGATIVE

## 2013-07-28 LAB — WET PREP, GENITAL
Clue Cells Wet Prep HPF POC: NONE SEEN
Trich, Wet Prep: NONE SEEN
Yeast Wet Prep HPF POC: NONE SEEN

## 2013-07-28 NOTE — MAU Provider Note (Signed)
History     CSN: [id number]  Arrival date and time: 07/27/13 2327   None     Chief Complaint  Patient presents with  . Routine Prenatal Visit   HPI  Kellie Deleon is a 22 y.o. G1P0000 at Unknown who presents today for pregnancy verification. She states that she thought her period was about to start but it did not. She has some very minimal cramping that she rates 1/10. She has a small amount of white vaginal discharge. She denies any VB.    Past Medical History  Diagnosis Date  . No significant past medical history   . Loss of teeth due to extraction   . PCOS (polycystic ovarian syndrome)   . Medical history non-contributory     Past Surgical History  Procedure Laterality Date  . Mouth surgery      Family History  Problem Relation Age of Onset  . Diabetes Mother   . Hypertension Mother   . Cancer Other     History  Substance Use Topics  . Smoking status: Former Smoker    Quit date: 10/27/2012  . Smokeless tobacco: Not on file  . Alcohol Use: Yes     Comment: Occassional Use    Allergies:  Allergies  Allergen Reactions  . Latex Rash and Other (See Comments)    Burning     Prescriptions prior to admission  Medication Sig Dispense Refill  . aspirin-acetaminophen-caffeine (EXCEDRIN MIGRAINE) 250-250-65 MG per tablet Take 2 tablets by mouth every 6 (six) hours as needed. For migraine.        ROS Physical Exam   Blood pressure 118/68, pulse 89, temperature 98 F (36.7 C), temperature source Oral, resp. rate 20, height 5\' 5"  (1.651 m), weight 96.616 kg (213 lb), last menstrual period 05/30/2013.  Physical Exam  Nursing note and vitals reviewed. Constitutional: She is oriented to person, place, and time. She appears well-developed and well-nourished. No distress.  Cardiovascular: Normal rate.   Respiratory: Effort normal.  GI: Soft. There is no tenderness.  Genitourinary:   External: no lesion Vagina: small amount of white discharge Cervix: pink,  smooth, no CMT Uterus: NSSC Adnexa: NT   Neurological: She is alert and oriented to person, place, and time.  Skin: Skin is warm and dry.  Psychiatric: She has a normal mood and affect.    MAU Course  Procedures  Results for orders placed during the hospital encounter of 07/27/13 (from the past 24 hour(s))  CBC     Status: None   Collection Time    07/28/13 12:15 AM      Result Value Range   WBC 10.4  4.0 - 10.5 K/uL   RBC 4.26  3.87 - 5.11 MIL/uL   Hemoglobin 12.9  12.0 - 15.0 g/dL   HCT 40.9  81.1 - 91.4 %   MCV 88.7  78.0 - 100.0 fL   MCH 30.3  26.0 - 34.0 pg   MCHC 34.1  30.0 - 36.0 g/dL   RDW 78.2  95.6 - 21.3 %   Platelets 289  150 - 400 K/uL  ABO/RH     Status: None   Collection Time    07/28/13 12:15 AM      Result Value Range   ABO/RH(D) O POS    HCG, QUANTITATIVE, PREGNANCY     Status: Abnormal   Collection Time    07/28/13 12:15 AM      Result Value Range   hCG, Beta Chain, Quant, S 2017 (*) <  5 mIU/mL  WET PREP, GENITAL     Status: Abnormal   Collection Time    07/28/13  1:09 AM      Result Value Range   Yeast Wet Prep HPF POC NONE SEEN  NONE SEEN   Trich, Wet Prep NONE SEEN  NONE SEEN   Clue Cells Wet Prep HPF POC NONE SEEN  NONE SEEN   WBC, Wet Prep HPF POC FEW (*) NONE SEEN     Assessment and Plan   1. Encounter for pregnancy test, result positive    Start [facility] as soon as possible First trimester danger signs reviewed Return to MAU as needed   Tawnya Crook 07/28/2013, 1:54 AM

## 2013-07-28 NOTE — MAU Note (Signed)
Pt states she has had HPT that was positive at home.Pt states she has been crampy "like her menstrual is about to come on"

## 2013-07-29 ENCOUNTER — Encounter (HOSPITAL_COMMUNITY): Payer: Self-pay | Admitting: *Deleted

## 2013-07-29 ENCOUNTER — Inpatient Hospital Stay (HOSPITAL_COMMUNITY)
Admission: AD | Admit: 2013-07-29 | Discharge: 2013-07-29 | Disposition: A | Discharge status: Home / Self Care | Payer: Medicaid Other | Source: Ambulatory Visit | Attending: Obstetrics & Gynecology | Admitting: Obstetrics & Gynecology

## 2013-07-29 ENCOUNTER — Inpatient Hospital Stay (HOSPITAL_COMMUNITY): Payer: Medicaid Other

## 2013-07-29 DIAGNOSIS — O200 Threatened abortion: Secondary | ICD-10-CM | POA: Insufficient documentation

## 2013-07-29 DIAGNOSIS — Z3490 Encounter for supervision of normal pregnancy, unspecified, unspecified trimester: Secondary | ICD-10-CM

## 2013-07-29 HISTORY — DX: Anxiety disorder, unspecified: F41.9

## 2013-07-29 LAB — URINE MICROSCOPIC-ADD ON

## 2013-07-29 LAB — URINALYSIS, ROUTINE W REFLEX MICROSCOPIC
Bilirubin Urine: NEGATIVE
Glucose, UA: NEGATIVE mg/dL
Protein, ur: NEGATIVE mg/dL
Urobilinogen, UA: 0.2 mg/dL (ref 0.0–1.0)

## 2013-07-29 LAB — HCG, QUANTITATIVE, PREGNANCY: hCG, Beta Chain, Quant, S: 3492 m[IU]/mL — ABNORMAL HIGH (ref <5)

## 2013-07-29 MED ORDER — PRENATAL VITAMINS 0.8 MG PO TABS: 1.0000 | ORAL_TABLET | Freq: Every day | ORAL | Status: DC

## 2013-07-29 NOTE — MAU Provider Note (Signed)
Attestation of Attending Supervision of Advanced Practitioner (PA/CNM/NP): Evaluation and management procedures were performed by the Advanced Practitioner under my supervision and collaboration.  I have reviewed the Advanced Practitioner's note and chart, and I agree with the management and plan.  Alexandra Posadas, MD, FACOG Attending Obstetrician & Gynecologist Faculty Practice, [facility]  

## 2013-07-29 NOTE — MAU Provider Note (Signed)
History     CSN: [id number]  Arrival date and time: 07/29/13 1050   First Provider Initiated Contact with Patient 07/29/13 1149      Chief Complaint  Patient presents with  . Vaginal Bleeding   HPI  [name] is 22 y.o. female G1P0000 at Unknown gestational age. She was here on the 11th for a pregnancy verification letter and had some blood work done; at that time she was not having any problems. She first noticed the bleeding this morning when she went to the bathroom; noticed it only when she wiped after using the bathroom. The color was red and is now brown; she has not had to wear a pad. Last intercourse was 1 week ago; no bleeding noted after intercourse. She has minimal pain; occasional abdominal cramps that comes and goes. She plans [facility] prenatal [facility]; she has been seen there in the past for PCOS    OB History   Grav Para Term Preterm Abortions TAB SAB Ect Mult Living   1 0 0 0 0 0 0 0 0 0       Past Medical History  Diagnosis Date  . No significant past medical history   . Loss of teeth due to extraction   . PCOS (polycystic ovarian syndrome)   . Medical history non-contributory   . Anxiety     Past Surgical History  Procedure Laterality Date  . Mouth surgery      Family History  Problem Relation Age of Onset  . Diabetes Mother   . Hypertension Mother   . Cancer Other     History  Substance Use Topics  . Smoking status: Former Smoker    Quit date: 10/27/2012  . Smokeless tobacco: Not on file  . Alcohol Use: Yes     Comment: Occassional Use    Allergies:  Allergies  Allergen Reactions  . Latex Rash and Other (See Comments)    Burning     Prescriptions prior to admission  Medication Sig Dispense Refill  . aspirin-acetaminophen-caffeine (EXCEDRIN MIGRAINE) 250-250-65 MG per tablet Take 2 tablets by mouth every 6 (six) hours as needed. For migraine.       Results for orders placed during the hospital encounter of  07/29/13 (from the past 24 hour(s))  URINALYSIS, ROUTINE W REFLEX MICROSCOPIC     Status: Abnormal   Collection Time    07/29/13 10:57 AM      Result Value Range   Color, Urine YELLOW  YELLOW   APPearance CLEAR  CLEAR   Specific Gravity, Urine 1.025  1.005 - 1.030   pH 6.5  5.0 - 8.0   Glucose, UA NEGATIVE  NEGATIVE mg/dL   Hgb urine dipstick TRACE (*) NEGATIVE   Bilirubin Urine NEGATIVE  NEGATIVE   Ketones, ur NEGATIVE  NEGATIVE mg/dL   Protein, ur NEGATIVE  NEGATIVE mg/dL   Urobilinogen, UA 0.2  0.0 - 1.0 mg/dL   Nitrite NEGATIVE  NEGATIVE   Leukocytes, UA NEGATIVE  NEGATIVE  URINE MICROSCOPIC-ADD ON     Status: Abnormal   Collection Time    07/29/13 10:57 AM      Result Value Range   WBC, UA 0-2  <3 WBC/hpf   RBC / HPF 0-2  <3 RBC/hpf   Bacteria, UA FEW (*) RARE  HCG, QUANTITATIVE, PREGNANCY     Status: Abnormal   Collection Time    07/29/13 12:03 PM      Result Value Range  hCG, Beta Chain, Quant, S 3492 (*) <5 mIU/mL   US Ob Comp Less 14 Wks  07/29/2013   *RADIOLOGY REPORT*  Clinical Data: Bleeding  OBSTETRIC <14 WK [place] AND TRANSVAGINAL OB US  Technique:  Both transabdominal and transvaginal ultrasound examinations were performed for complete evaluation of the gestation as well as the maternal uterus, adnexal regions, and pelvic cul-de-sac.  Transvaginal technique was performed to assess early pregnancy.  Comparison:  None.  Intrauterine gestational sac:  Round, well-defined intrauterine gestational sac identified. Yolk sac: Yes Embryo: Not visualized Cardiac Activity: None  MSD: 6.3 mm  5 w 1 d [place] EDC: Mar 30, 2014  Maternal uterus/adnexae: Trace subchorionic hemorrhage.  Otherwise, the uterus and adnexa are unremarkable.  Corpus luteum identified in the left ovary.  No significant free fluid.  IMPRESSION:  Intrauterine gestational sac and yolk sac, but no fetal pole, or cardiac activity yet visualized.  Recommend follow-up quantitative B-HCG levels and follow-up US in 14 days to  confirm and assess viability. This recommendation follows SRU consensus guidelines: Diagnostic Criteria for Nonviable Pregnancy Early in the First Trimester.  Malva Limes Med 2013; 161:0960-45.   Original Report Authenticated By: Malachy Moan, M.D.   US Ob Transvaginal  07/29/2013   *RADIOLOGY REPORT*  Clinical Data: Bleeding  OBSTETRIC <14 WK [place] AND TRANSVAGINAL OB US  Technique:  Both transabdominal and transvaginal ultrasound examinations were performed for complete evaluation of the gestation as well as the maternal uterus, adnexal regions, and pelvic cul-de-sac.  Transvaginal technique was performed to assess early pregnancy.  Comparison:  None.  Intrauterine gestational sac:  Round, well-defined intrauterine gestational sac identified. Yolk sac: Yes Embryo: Not visualized Cardiac Activity: None  MSD: 6.3 mm  5 w 1 d [place] EDC: Mar 30, 2014  Maternal uterus/adnexae: Trace subchorionic hemorrhage.  Otherwise, the uterus and adnexa are unremarkable.  Corpus luteum identified in the left ovary.  No significant free fluid.  IMPRESSION:  Intrauterine gestational sac and yolk sac, but no fetal pole, or cardiac activity yet visualized.  Recommend follow-up quantitative B-HCG levels and follow-up US in 14 days to confirm and assess viability. This recommendation follows SRU consensus guidelines: Diagnostic Criteria for Nonviable Pregnancy Early in the First Trimester.  Malva Limes Med 2013; 409:8119-14.   Original Report Authenticated By: Malachy Moan, M.D.    Review of Systems  Constitutional: Positive for chills and weight loss. Negative for fever.  Gastrointestinal: Positive for nausea, abdominal pain and constipation. Negative for vomiting and diarrhea.       Lower, mid abdominal cramping   Genitourinary: Negative for dysuria, urgency and frequency.  Neurological: Positive for headaches.   Physical Exam   Blood pressure 134/72, pulse 87, temperature 98 F (36.7 C), resp. rate 16, height 5\' 5"  (1.651  m), weight 94.802 kg (209 lb), last menstrual period 06/14/2013.  Physical Exam  Constitutional: She is oriented to person, place, and time. She appears well-developed and well-nourished. No distress.  Neck: Neck supple.  Respiratory: Effort normal.  GI: Soft. She exhibits no distension. There is no tenderness. There is no rebound.  Genitourinary: Vaginal discharge found.  Speculum exam: Vagina - Small amount of brown, thick discharge, no odor Cervix - No contact bleeding Bimanual exam: Cervix closed Uterus non tender, normal size for gestational age  Adnexa non tender, no masses bilaterally GC/Chlam, wet prep done Chaperone present for exam.   Neurological: She is alert and oriented to person, place, and time.  Skin: Skin is warm. She  is not diaphoretic.    MAU Course  Procedures None  MDM Repeat beta hcg [place]   Assessment and Plan  A: IUP; based on US done on 07/29/13 Vaginal bleeding in pregnancy; first trimester  Threatened miscarriage Trace Subchorionic hemorrhage   P:  Discharge home Threatened miscarriage precautions discussed  Referral sent to [facility] clinic; they will call you to schedule appointment  RX: Prenatal vitamins  Return to MAU if symptoms worsen  Support given   Venia Carbon IRENE FNP-C  07/29/2013, 2:34 PM

## 2013-07-29 NOTE — MAU Note (Signed)
Patient presents to MAU with c/o vaginal bleeding noted this morning with wiping. Reports lower abdominal cramping x 3 weeks. Patient was seen here early Friday morning and had speculum exam at that time.

## 2013-07-29 NOTE — MAU Note (Signed)
Pt presents with complaints of bright red vaginal bleeding that started this morning. States that she notices the bleeding when she wipes. Reports some lower abdominal cramping

## 2013-08-07 NOTE — MAU Provider Note (Signed)
Attestation of Attending Supervision of Advanced Practitioner (CNM/NP): Evaluation and management procedures were performed by the Advanced Practitioner under my supervision and collaboration. I have reviewed the Advanced Practitioner's note and chart, and I agree with the management and plan.  LEGGETT,KELLY H. 7:34 AM

## 2013-09-01 ENCOUNTER — Encounter: Payer: Self-pay | Admitting: Obstetrics and Gynecology

## 2013-09-01 ENCOUNTER — Ambulatory Visit (INDEPENDENT_AMBULATORY_CARE_PROVIDER_SITE_OTHER): Payer: Medicaid Other | Admitting: Obstetrics and Gynecology

## 2013-09-01 ENCOUNTER — Other Ambulatory Visit (HOSPITAL_COMMUNITY)
Admission: RE | Admit: 2013-09-01 | Discharge: 2013-09-01 | Disposition: A | Discharge status: Home / Self Care | Payer: Medicaid Other | Source: Ambulatory Visit | Attending: Obstetrics & Gynecology | Admitting: Obstetrics & Gynecology

## 2013-09-01 VITALS — BP 107/78 | Temp 97.7°F | Wt 212.1 lb

## 2013-09-01 DIAGNOSIS — Z3401 Encounter for supervision of normal first pregnancy, first trimester: Secondary | ICD-10-CM

## 2013-09-01 DIAGNOSIS — Z01419 Encounter for gynecological examination (general) (routine) without abnormal findings: Secondary | ICD-10-CM | POA: Insufficient documentation

## 2013-09-01 DIAGNOSIS — O99340 Other mental disorders complicating pregnancy, unspecified trimester: Secondary | ICD-10-CM

## 2013-09-01 LAB — POCT URINALYSIS DIP (DEVICE)
Hgb urine dipstick: NEGATIVE
Ketones, ur: NEGATIVE mg/dL
Protein, ur: NEGATIVE mg/dL
Specific Gravity, Urine: 1.025 (ref 1.005–1.030)

## 2013-09-01 MED ORDER — PRENATAL VITAMINS 0.8 MG PO TABS: 1.0000 | ORAL_TABLET | Freq: Every day | ORAL | Status: DC

## 2013-09-01 NOTE — Patient Instructions (Signed)
Pregnancy - First Trimester  During sexual intercourse, millions of sperm go into the vagina. Only 1 sperm will penetrate and fertilize the female egg while it is in the Fallopian tube. One week later, the fertilized egg implants into the wall of the uterus. An embryo begins to develop into a baby. At 6 to 8 weeks, the eyes and face are formed and the heartbeat can be seen on ultrasound. At the end of 12 weeks (first trimester), all the baby's organs are formed. Now that you are pregnant, you will want to do everything you can to have a healthy baby. Two of the most important things are to get good prenatal care and follow your caregiver's instructions. Prenatal care is all the medical care you receive before the baby's birth. It is given to prevent, find, and treat problems during the pregnancy and childbirth.  PRENATAL EXAMS  · During prenatal visits, your weight, blood pressure, and urine are checked. This is done to make sure you are healthy and progressing normally during the pregnancy.  · A pregnant woman should gain 25 to 35 pounds during the pregnancy. However, if you are overweight or underweight, your caregiver will advise you regarding your weight.  · Your caregiver will ask and answer questions for you.  · Blood work, cervical cultures, other necessary tests, and a Pap test are done during your prenatal exams. These tests are done to check on your health and the probable health of your baby. Tests are strongly recommended and done for HIV with your permission. This is the virus that causes AIDS. These tests are done because medicines can be given to help prevent your baby from being born with this infection should you have been infected without knowing it. Blood work is also used to find out your blood type, previous infections, and follow your blood levels (hemoglobin).  · Low hemoglobin (anemia) is common during pregnancy. Iron and vitamins are given to help prevent this. Later in the pregnancy, blood  tests for diabetes will be done along with any other tests if any problems develop.  · You may need other tests to make sure you and the baby are doing well.  CHANGES DURING THE FIRST TRIMESTER   Your body goes through many changes during pregnancy. They vary from person to person. Talk to your caregiver about changes you notice and are concerned about. Changes can include:  · Your menstrual period stops.  · The egg and sperm carry the genes that determine what you look like. Genes from you and your partner are forming a baby. The female genes determine whether the baby is a boy or a girl.  · Your body increases in girth and you may feel bloated.  · Feeling sick to your stomach (nauseous) and throwing up (vomiting). If the vomiting is uncontrollable, call your caregiver.  · Your breasts will begin to enlarge and become tender.  · Your nipples may stick out more and become darker.  · The need to urinate more. Painful urination may mean you have a bladder infection.  · Tiring easily.  · Loss of appetite.  · Cravings for certain kinds of food.  · At first, you may gain or lose a couple of pounds.  · You may have changes in your emotions from day to day (excited to be pregnant or concerned something may go wrong with the pregnancy and baby).  · You may have more vivid and strange dreams.  HOME CARE INSTRUCTIONS   ·   It is very important to avoid all smoking, alcohol and non-prescribed drugs during your pregnancy. These affect the formation and growth of the baby. Avoid chemicals while pregnant to ensure the delivery of a healthy infant.  · Start your prenatal visits by the 12th week of pregnancy. They are usually scheduled monthly at first, then more often in the last 2 months before delivery. Keep your caregiver's appointments. Follow your caregiver's instructions regarding medicine use, blood and lab tests, exercise, and diet.  · During pregnancy, you are providing food for you and your baby. Eat regular, well-balanced  meals. Choose foods such as meat, fish, milk and other low fat dairy products, vegetables, fruits, and whole-grain breads and cereals. Your caregiver will tell you of the ideal weight gain.  · You can help morning sickness by keeping soda crackers at the bedside. Eat a couple before arising in the morning. You may want to use the crackers without salt on them.  · Eating 4 to 5 small meals rather than 3 large meals a day also may help the nausea and vomiting.  · Drinking liquids between meals instead of during meals also seems to help nausea and vomiting.  · A physical sexual relationship may be continued throughout pregnancy if there are no other problems. Problems may be early (premature) leaking of amniotic fluid from the membranes, vaginal bleeding, or belly (abdominal) pain.  · Exercise regularly if there are no restrictions. Check with your caregiver or physical therapist if you are unsure of the safety of some of your exercises. Greater weight gain will occur in the last 2 trimesters of pregnancy. Exercising will help:  · Control your weight.  · Keep you in shape.  · Prepare you for labor and delivery.  · Help you lose your pregnancy weight after you deliver your baby.  · Wear a good support or jogging bra for breast tenderness during pregnancy. This may help if worn during sleep too.  · Ask when prenatal classes are available. Begin classes when they are offered.  · Do not use hot tubs, steam rooms, or saunas.  · Wear your seat belt when driving. This protects you and your baby if you are in an accident.  · Avoid raw meat, uncooked cheese, cat litter boxes, and soil used by cats throughout the pregnancy. These carry germs that can cause birth defects in the baby.  · The first trimester is a good time to visit your dentist for your dental health. Getting your teeth cleaned is okay. Use a softer toothbrush and brush gently during pregnancy.  · Ask for help if you have financial, counseling, or nutritional needs  during pregnancy. Your caregiver will be able to offer counseling for these needs as well as refer you for other special needs.  · Do not take any medicines or herbs unless told by your caregiver.  · Inform your caregiver if there is any mental or physical domestic violence.  · Make a list of emergency phone numbers of family, friends, hospital, and police and fire departments.  · Write down your questions. Take them to your prenatal visit.  · Do not douche.  · Do not cross your legs.  · If you have to stand for long periods of time, rotate you feet or take small steps in a circle.  · You may have more vaginal secretions that may require a sanitary pad. Do not use tampons or scented sanitary pads.  MEDICINES AND DRUG USE IN PREGNANCY  ·   Take prenatal vitamins as directed. The vitamin should contain 1 milligram of folic acid. Keep all vitamins out of reach of children. Only a couple vitamins or tablets containing iron may be fatal to a baby or young child when ingested.  · Avoid use of all medicines, including herbs, over-the-counter medicines, not prescribed or suggested by your caregiver. Only take over-the-counter or prescription medicines for pain, discomfort, or fever as directed by your caregiver. Do not use aspirin, ibuprofen, or naproxen unless directed by your caregiver.  · Let your caregiver also know about herbs you may be using.  · Alcohol is related to a number of birth defects. This includes fetal alcohol syndrome. All alcohol, in any form, should be avoided completely. Smoking will cause low birth rate and premature babies.  · Street or illegal drugs are very harmful to the baby. They are absolutely forbidden. A baby born to an addicted mother will be addicted at birth. The baby will go through the same withdrawal an adult does.  · Let your caregiver know about any medicines that you have to take and for what reason you take them.  SEEK MEDICAL CARE IF:   You have any concerns or worries during your  pregnancy. It is better to call with your questions if you feel they cannot wait, rather than worry about them.  SEEK IMMEDIATE MEDICAL CARE IF:   · An unexplained oral temperature above 102° F (38.9° C) develops, or as your caregiver suggests.  · You have leaking of fluid from the vagina (birth canal). If leaking membranes are suspected, take your temperature and inform your caregiver of this when you call.  · There is vaginal spotting or bleeding. Notify your caregiver of the amount and how many pads are used.  · You develop a bad smelling vaginal discharge with a change in the color.  · You continue to feel sick to your stomach (nauseated) and have no relief from remedies suggested. You vomit blood or coffee ground-like materials.  · You lose more than 2 pounds of weight in 1 week.  · You gain more than 2 pounds of weight in 1 week and you notice swelling of your face, hands, feet, or legs.  · You gain 5 pounds or more in 1 week (even if you do not have swelling of your hands, face, legs, or feet).  · You get exposed to German measles and have never had them.  · You are exposed to fifth disease or chickenpox.  · You develop belly (abdominal) pain. Round ligament discomfort is a common non-cancerous (benign) cause of abdominal pain in pregnancy. Your caregiver still must evaluate this.  · You develop headache, fever, diarrhea, pain with urination, or shortness of breath.  · You fall or are in a car accident or have any kind of trauma.  · There is mental or physical violence in your home.  Document Released: 10/27/2001 Document Revised: 07/27/2012 Document Reviewed: 04/30/2009  [employer]® Patient Information ©2014 [employer].

## 2013-09-01 NOTE — Progress Notes (Signed)
   Subjective:    Kellie Deleon is a G1P0000 [redacted]w[redacted]d being seen today for her first obstetrical visit.  Her obstetrical history is significant for primigravida.. Patient does intend to breast feed. Pregnancy history fully reviewed.  Patient reports no complaints. Hx anxiety, never on meds. 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 Husband present  Filed Vitals:   09/01/13 1023  BP: 107/78  Temp: 97.7 F (36.5 C)  Weight: 212 lb 1.6 oz (96.208 kg)    HISTORY: OB History  Gravida Para Term Preterm AB SAB TAB Ectopic Multiple Living  1 0 0 0 0 0 0 0 0 0     # Outcome Date GA Lbr Len/2nd Weight Sex Delivery Anes PTL Lv  1 CUR              Past Medical History  Diagnosis Date  . No significant past medical history   . Loss of teeth due to extraction   . PCOS (polycystic ovarian syndrome)   . Medical history non-contributory   . Anxiety    Past Surgical History  Procedure Laterality Date  . Mouth surgery     Family History  Problem Relation Age of Onset  . Cancer Other   . Hypertension Brother      Exam    Uterus:     Pelvic Exam:    Perineum: No Hemorrhoids, Normal Perineum   Vulva: normal, Bartholin's, Urethra, Skene's normal   Vagina:  normal mucosa, normal discharge, Pap done       Cervix: clean, nulliparous, L/C   Adnexa: no mass, fullness, tenderness   Bony Pelvis: average  System: Breast:  normal appearance, no masses or tenderness   Skin: normal coloration and turgor, no rashes    Neurologic: oriented, normal, grossly non-focal   Extremities: normal strength, tone, and muscle mass   HEENT PERRLA and extra ocular movement intact   Mouth/Teeth mucous membranes moist, pharynx normal without lesions and dental hygiene good   Neck supple and no masses   Cardiovascular: regular rate and rhythm, no murmurs or gallops   Respiratory:  appears well, vitals normal, no respiratory distress, acyanotic, normal RR, ear and throat exam is normal, neck free of mass or  lymphadenopathy, chest clear, no wheezing, crepitations, rhonchi, normal symmetric air entry   Abdomen: soft, non-tender; bowel sounds normal; no masses,  no organomegaly DT 180   Urinary: urethral meatus normal      Assessment:    Pregnancy: G1P0000 Patient Active Problem List   Diagnosis Date Noted  . Supervision of normal first pregnancy in first trimester 09/01/2013  . Irregular menses 11/24/2012  . Anxiety 11/24/2012        Plan:     Initial labs drawn. Prenatal vitamins. Problem list reviewed and updated. Genetic Screening discussed First Screen: undecided. Will call before next visit if desired  Ultrasound discussed; fetal survey: requested.  Follow up in 4 weeks. 50% of 30 min visit spent on counseling and coordination of care.  Healthy lifestyle, diet, fan and breaks at school, visit routines discussed   Erven Ramson 09/01/2013

## 2013-09-01 NOTE — Progress Notes (Signed)
P=81 C/o of lower abdomen/pelvic pressure, stabbing, intermittently. Pt. Also states she has been having more headaches than normal.  Pt. Declines flu vaccine today; information sheet given and pt. States she will think about it and possibly get it next time.  New OB packet given to pt.  Discussed appropriate weight gain for pregnancy based on BMI; pt. Verbalizes understanding.

## 2013-09-01 NOTE — Addendum Note (Signed)
Addended by: Toula Moos on: 09/01/2013 12:25 PM   Modules accepted: Orders

## 2013-09-01 NOTE — Addendum Note (Signed)
Addended by: Caren Griffins C on: 09/01/2013 11:12 AM   Modules accepted: Orders

## 2013-09-02 LAB — OBSTETRIC PANEL
Basophils Absolute: 0 10*3/uL (ref 0.0–0.1)
Hepatitis B Surface Ag: NEGATIVE
Lymphocytes Relative: 16 % (ref 12–46)
Lymphs Abs: 1.7 10*3/uL (ref 0.7–4.0)
MCV: 88.1 fL (ref 78.0–100.0)
Neutro Abs: 8 10*3/uL — ABNORMAL HIGH (ref 1.7–7.7)
Platelets: 295 10*3/uL (ref 150–400)
RBC: 4.3 MIL/uL (ref 3.87–5.11)
RDW: 12.9 % (ref 11.5–15.5)
Rubella: 7.91 Index — ABNORMAL HIGH (ref <0.90)
WBC: 10.5 10*3/uL (ref 4.0–10.5)

## 2013-09-02 LAB — PRESCRIPTION MONITORING PROFILE (19 PANEL)
Barbiturate Screen, Urine: NEGATIVE ng/mL
Benzodiazepine Screen, Urine: NEGATIVE ng/mL
Buprenorphine, Urine: NEGATIVE ng/mL
Cannabinoid Scrn, Ur: NEGATIVE ng/mL
Cocaine Metabolites: NEGATIVE ng/mL
Fentanyl, Ur: NEGATIVE ng/mL
Methaqualone: NEGATIVE ng/mL
Opiate Screen, Urine: NEGATIVE ng/mL
Tramadol Scrn, Ur: NEGATIVE ng/mL
Zolpidem, Urine: NEGATIVE ng/mL
pH, Initial: 7.5 pH (ref 4.5–8.9)

## 2013-09-02 LAB — ALCOHOL METABOLITE (ETG), URINE: Ethyl Glucuronide (EtG): NEGATIVE ng/mL

## 2013-09-04 LAB — CULTURE, OB URINE

## 2013-09-05 LAB — HEMOGLOBINOPATHY EVALUATION
Hemoglobin Other: 0 %
Hgb A2 Quant: 2 % — ABNORMAL LOW (ref 2.2–3.2)
Hgb A: 98 % — ABNORMAL HIGH (ref 96.8–97.8)
Hgb F Quant: 0 % (ref 0.0–2.0)
Hgb S Quant: 0 %

## 2013-09-06 ENCOUNTER — Encounter: Payer: Self-pay | Admitting: *Deleted

## 2013-09-06 DIAGNOSIS — O99340 Other mental disorders complicating pregnancy, unspecified trimester: Secondary | ICD-10-CM | POA: Insufficient documentation

## 2013-09-11 ENCOUNTER — Inpatient Hospital Stay (HOSPITAL_COMMUNITY)
Admission: AD | Admit: 2013-09-11 | Discharge: 2013-09-11 | Disposition: A | Discharge status: Home / Self Care | Payer: Medicaid Other | Source: Ambulatory Visit | Attending: Obstetrics & Gynecology | Admitting: Obstetrics & Gynecology

## 2013-09-11 ENCOUNTER — Encounter (HOSPITAL_COMMUNITY): Payer: Self-pay

## 2013-09-11 DIAGNOSIS — Z3401 Encounter for supervision of normal first pregnancy, first trimester: Secondary | ICD-10-CM

## 2013-09-11 DIAGNOSIS — R51 Headache: Secondary | ICD-10-CM

## 2013-09-11 DIAGNOSIS — H6120 Impacted cerumen, unspecified ear: Secondary | ICD-10-CM | POA: Insufficient documentation

## 2013-09-11 DIAGNOSIS — R110 Nausea: Secondary | ICD-10-CM

## 2013-09-11 DIAGNOSIS — H6123 Impacted cerumen, bilateral: Secondary | ICD-10-CM

## 2013-09-11 DIAGNOSIS — O26891 Other specified pregnancy related conditions, first trimester: Secondary | ICD-10-CM

## 2013-09-11 DIAGNOSIS — O210 Mild hyperemesis gravidarum: Secondary | ICD-10-CM | POA: Insufficient documentation

## 2013-09-11 DIAGNOSIS — O99891 Other specified diseases and conditions complicating pregnancy: Secondary | ICD-10-CM | POA: Insufficient documentation

## 2013-09-11 DIAGNOSIS — Z3400 Encounter for supervision of normal first pregnancy, unspecified trimester: Secondary | ICD-10-CM | POA: Insufficient documentation

## 2013-09-11 HISTORY — DX: Headache: R51

## 2013-09-11 HISTORY — DX: Bronchitis, not specified as acute or chronic: J40

## 2013-09-11 LAB — URINALYSIS, ROUTINE W REFLEX MICROSCOPIC
Glucose, UA: NEGATIVE mg/dL
Leukocytes, UA: NEGATIVE
pH: 7 (ref 5.0–8.0)

## 2013-09-11 LAB — WET PREP, GENITAL
Clue Cells Wet Prep HPF POC: NONE SEEN
Yeast Wet Prep HPF POC: NONE SEEN

## 2013-09-11 MED ORDER — PROMETHAZINE HCL 25 MG PO TABS
25.0000 mg | ORAL_TABLET | Freq: Once | ORAL | Status: AC
  Administered 2013-09-11: 25 mg via ORAL
  Filled 2013-09-11: qty 1

## 2013-09-11 MED ORDER — ACETAMINOPHEN 500 MG PO TABS
1000.0000 mg | ORAL_TABLET | Freq: Once | ORAL | Status: AC
  Administered 2013-09-11: 1000 mg via ORAL
  Filled 2013-09-11: qty 2

## 2013-09-11 MED ORDER — PRENATAL VITAMINS 28-0.8 MG PO TABS: 1.0000 | ORAL_TABLET | Freq: Every day | ORAL | Status: DC

## 2013-09-11 MED ORDER — PROMETHAZINE HCL 25 MG PO TABS: 25.0000 mg | ORAL_TABLET | Freq: Four times a day (QID) | ORAL | Status: DC | PRN

## 2013-09-11 NOTE — MAU Note (Signed)
Patient is in with c/o chronic headache and ear ache since she got pregnant. Patient states that the headache have been persistent for 3 days now. She states that she took tylenol on day one without relief (she have not taken anything else since(. She c/o of vague abdominal soreness and possible heart beating fast when she is sleeping. She denies any cramping, vaginal bleeding or abnormal discharge.

## 2013-09-11 NOTE — MAU Provider Note (Signed)
Attestation of Attending Supervision of Advanced Practitioner (CNM/NP): Evaluation and management procedures were performed by the Advanced Practitioner under my supervision and collaboration. I have reviewed the Advanced Practitioner's note and chart, and I agree with the management and plan.  Medardo Hassing H. 4:48 PM

## 2013-09-11 NOTE — MAU Provider Note (Signed)
History     CSN: [id number]  Arrival date and time: 09/11/13 4098   First Provider Initiated Contact with Patient 09/11/13 [id number]      Chief Complaint  Patient presents with  . Headache  . Otalgia  . Vaginal Discharge  . Nausea   HPI  [name] is a 22 y.o. female G1P0000 at [redacted]w[redacted]d who presents with HA, ear pain and Nausea. These symptoms started around the time she found out she was pregnant. She explains that she will keep a HA for 4 days straight. She took one dose of tylenol that did not work so she stopped taking anything for the pain. She currently rates her HA pain 6/10. The ear pain is bilateral; worse on the left than right. The pain is described at pressure/fullness, "feels like a headache in my ears". She also complains of vaginal discharge that started a few weeks ago; the discharge is white and milky, no odor.  Pt has had problems in the past with wax in her ears and has had to have them flushed.   OB History   Grav Para Term Preterm Abortions TAB SAB Ect Mult Living   1 0 0 0 0 0 0 0 0 0       Past Medical History  Diagnosis Date  . No significant past medical history   . Loss of teeth due to extraction   . PCOS (polycystic ovarian syndrome)   . Medical history non-contributory   . Anxiety   . Headache(784.0)   . Bronchitis     Past Surgical History  Procedure Laterality Date  . Mouth surgery      Family History  Problem Relation Age of Onset  . Cancer Other   . Hypertension Brother     History  Substance Use Topics  . Smoking status: Former Smoker    Quit date: 10/27/2012  . Smokeless tobacco: Not on file  . Alcohol Use: No     Comment: Occassional Use    Allergies:  Allergies  Allergen Reactions  . Latex Rash and Other (See Comments)    Burning     Prescriptions prior to admission  Medication Sig Dispense Refill  . Prenatal Multivit-Min-Fe-FA (PRENATAL VITAMINS) 0.8 MG tablet Take 1 tablet by mouth daily.  30 tablet  3    Results for orders placed during the hospital encounter of 09/11/13 (from the past 24 hour(s))  URINALYSIS, ROUTINE W REFLEX MICROSCOPIC     Status: Abnormal   Collection Time    09/11/13  9:30 AM      Result Value Range   Color, Urine YELLOW  YELLOW   APPearance CLOUDY (*) CLEAR   Specific Gravity, Urine 1.020  1.005 - 1.030   pH 7.0  5.0 - 8.0   Glucose, UA NEGATIVE  NEGATIVE mg/dL   Hgb urine dipstick NEGATIVE  NEGATIVE   Bilirubin Urine NEGATIVE  NEGATIVE   Ketones, ur NEGATIVE  NEGATIVE mg/dL   Protein, ur NEGATIVE  NEGATIVE mg/dL   Urobilinogen, UA 0.2  0.0 - 1.0 mg/dL   Nitrite NEGATIVE  NEGATIVE   Leukocytes, UA NEGATIVE  NEGATIVE  WET PREP, GENITAL     Status: Abnormal   Collection Time    09/11/13 10:38 AM      Result Value Range   Yeast Wet Prep HPF POC NONE SEEN  NONE SEEN   Trich, Wet Prep NONE SEEN  NONE SEEN   Clue Cells Wet Prep HPF POC NONE SEEN  NONE SEEN  WBC, Wet Prep HPF POC FEW (*) NONE SEEN   Review of Systems  Constitutional: Negative for fever and chills.  HENT: Positive for ear pain and tinnitus.   Eyes: Negative for blurred vision.  Gastrointestinal: Positive for nausea. Negative for abdominal pain, diarrhea and constipation.  Genitourinary: Negative for dysuria, urgency, frequency and hematuria.       + vaginal discharge. No vaginal bleeding. No dysuria.   Neurological: Positive for dizziness and headaches.       + Dizziness when standing.    Physical Exam   Blood pressure 133/67, pulse 75, temperature 98.3 F (36.8 C), temperature source Oral, resp. rate 18, height 5' 5.8" (1.671 m), weight 97.433 kg (214 lb 12.8 oz), last menstrual period 06/14/2013, SpO2 99.00%.  Fetal Heart tones by doppler 168 bpm   Physical Exam  Constitutional: She is oriented to person, place, and time. She appears well-developed and well-nourished. No distress.  HENT:  Head: Normocephalic.  Right Ear: No tenderness. Tympanic membrane is not erythematous.   Left Ear: No tenderness. Tympanic membrane is not erythematous.  Unable to visualize bilateral TM's due to cerumen.   Eyes: Pupils are equal, round, and reactive to light.  Neck: Neck supple.  Respiratory: Effort normal.  GI: Soft.  Neurological: She is alert and oriented to person, place, and time. No sensory deficit. GCS eye subscore is 4. GCS verbal subscore is 5. GCS motor subscore is 6.  Skin: Skin is warm. She is not diaphoretic.    MAU Course  Procedures  MDM Wet prep Tylenol 1 gram Phenergan 25 mg PO  + fht At discharge patient rates her pain 0/10.   Assessment and Plan  A: 1. Excessive cerumen in both ear canals   2. Headache in pregnancy, first trimester   3. Nausea alone   4. Supervision of normal first pregnancy in first trimester    P: Discharge home RX: Phenergan        Prenatal vitamins Pick up Debrox for excessive cerumen and use as directed on the bottle. If ear pain does not improve go to urgent care to have ears flushed Ok to take tylenol as directed on the bottle Return to MAU as needed, if symptoms worsen.   Karsten Vaughn IRENE 09/11/2013, 9:53 AM

## 2013-09-15 ENCOUNTER — Inpatient Hospital Stay (HOSPITAL_COMMUNITY)
Admission: AD | Admit: 2013-09-15 | Discharge: 2013-09-15 | Disposition: A | Discharge status: Home / Self Care | Payer: Medicaid Other | Source: Ambulatory Visit | Attending: Obstetrics & Gynecology | Admitting: Obstetrics & Gynecology

## 2013-09-15 ENCOUNTER — Encounter (HOSPITAL_COMMUNITY): Payer: Self-pay | Admitting: General Practice

## 2013-09-15 DIAGNOSIS — W010XXA Fall on same level from slipping, tripping and stumbling without subsequent striking against object, initial encounter: Secondary | ICD-10-CM | POA: Insufficient documentation

## 2013-09-15 DIAGNOSIS — W108XXA Fall (on) (from) other stairs and steps, initial encounter: Secondary | ICD-10-CM

## 2013-09-15 DIAGNOSIS — Y9229 Other specified public building as the place of occurrence of the external cause: Secondary | ICD-10-CM | POA: Insufficient documentation

## 2013-09-15 DIAGNOSIS — O99891 Other specified diseases and conditions complicating pregnancy: Secondary | ICD-10-CM | POA: Insufficient documentation

## 2013-09-15 DIAGNOSIS — R109 Unspecified abdominal pain: Secondary | ICD-10-CM | POA: Insufficient documentation

## 2013-09-15 DIAGNOSIS — W102XXA Fall (on)(from) incline, initial encounter: Secondary | ICD-10-CM

## 2013-09-15 NOTE — MAU Note (Signed)
Fell on stomach around 1400.  Since then has been having little sharp cramps.

## 2013-09-15 NOTE — MAU Note (Signed)
Pt states she tripped and fell o her stomach.

## 2013-09-15 NOTE — MAU Provider Note (Signed)
Chief Complaint: Fall   First Provider Initiated Contact with Patient 09/15/13 1759     SUBJECTIVE HPI: Kellie Deleon is a 22 y.o. G1P0000 at [redacted]w[redacted]d by LMP who presents after a fall which occurred 2 hrs PTA. Rushing to get out of class and slipped forward on hard surface breaking her fall with both outstretched arms. No skin abrasions or injury. Her instructor told her to come here since she is pregnant. No vaginal bleeding. No pain now, but had some fleeting sharp lower abdominal pains after the fall.   Pregnancy Course: Known to me from [facility]; essentially uncomplicated.  Past Medical History  Diagnosis Date  . No significant past medical history   . Loss of teeth due to extraction   . PCOS (polycystic ovarian syndrome)   . Medical history non-contributory   . Anxiety   . Headache(784.0)   . Bronchitis    OB History  Gravida Para Term Preterm AB SAB TAB Ectopic Multiple Living  1 0 0 0 0 0 0 0 0 0     # Outcome Date GA Lbr Len/2nd Weight Sex Delivery Anes PTL Lv  1 CUR              Past Surgical History  Procedure Laterality Date  . Mouth surgery     History   Social History  . Marital Status: Single    Spouse Name: N/A    Number of Children: N/A  . Years of Education: N/A   Occupational History  . Not on file.   Social History Main Topics  . Smoking status: Former Smoker    Quit date: 10/27/2012  . Smokeless tobacco: Not on file  . Alcohol Use: No     Comment: Occassional Use  . Drug Use: No  . Sexual Activity: Yes    Birth Control/ Protection: None   Other Topics Concern  . Not on file   Social History Narrative  . No narrative on file   No current facility-administered medications on file prior to encounter.   Current Outpatient Prescriptions on File Prior to Encounter  Medication Sig Dispense Refill  . acetaminophen (TYLENOL) 500 MG tablet Take 1,000 mg by mouth every 6 (six) hours as needed for pain.      . Prenatal Vit-Fe Fumarate-FA (PRENATAL  VITAMINS) 28-0.8 MG TABS Take 1 tablet by mouth daily.  30 tablet  3  . promethazine (PHENERGAN) 25 MG tablet Take 1 tablet (25 mg total) by mouth every 6 (six) hours as needed for nausea.  20 tablet  0   Allergies  Allergen Reactions  . Latex Rash and Other (See Comments)    Burning     ROS: Pertinent items in HPI  OBJECTIVE Blood pressure 119/76, pulse 89, temperature 98.2 F (36.8 C), temperature source Oral, resp. rate 18, height 5\' 4"  (1.626 m), weight 218 lb (98.884 kg), last menstrual period 06/14/2013. GENERAL: Well-developed, well-nourished female in no acute distress.  HEENT: Normocephalic HEART: normal rate RESP: normal effort ABDOMEN: Soft, non-tender. FHR 150s per Doppler EXTREMITIES: Nontender, no edema NEURO: Alert and oriented  or masses  LAB RESULTS No results found for this or any previous visit (from the past 24 hour(s)).  IMAGING No results found.  MAU COURSE  ASSESSMENT 1. Fall (on)(from) incline, initial encounter   G1 at [redacted]w[redacted]d, viability confirmed  PLAN Discharge home with reassurance.    Medication List         acetaminophen 500 MG tablet  Commonly known as:  TYLENOL  Take 1,000 mg by mouth every 6 (six) hours as needed for pain.     Prenatal Vitamins 28-0.8 MG Tabs  Take 1 tablet by mouth daily.     promethazine 25 MG tablet  Commonly known as:  PHENERGAN  Take 1 tablet (25 mg total) by mouth every 6 (six) hours as needed for nausea.       Follow-up Information   Follow up with [facility] In 2 weeks. (Keep your scheduled appointment)    Specialty:  Obstetrics and Gynecology   Contact information:   [address] [place] [address] [phone]      Danae Orleans, CNM 09/15/2013  6:02 PM

## 2013-09-16 NOTE — MAU Provider Note (Signed)
Attestation of Attending Supervision of Advanced Practitioner (CNM/NP): Evaluation and management procedures were performed by the Advanced Practitioner under my supervision and collaboration.  I have reviewed the Advanced Practitioner's note and chart, and I agree with the management and plan.  HARRAWAY-SMITH, Darwin Guastella 6:49 AM     

## 2013-09-19 ENCOUNTER — Emergency Department (HOSPITAL_COMMUNITY)
Admission: EM | Admit: 2013-09-19 | Discharge: 2013-09-19 | Disposition: A | Discharge status: Home / Self Care | Payer: Medicaid Other | Source: Home / Self Care

## 2013-09-19 ENCOUNTER — Encounter (HOSPITAL_COMMUNITY): Payer: Self-pay | Admitting: Family Medicine

## 2013-09-19 DIAGNOSIS — R51 Headache: Secondary | ICD-10-CM

## 2013-09-19 DIAGNOSIS — H6120 Impacted cerumen, unspecified ear: Secondary | ICD-10-CM

## 2013-09-19 DIAGNOSIS — Z3490 Encounter for supervision of normal pregnancy, unspecified, unspecified trimester: Secondary | ICD-10-CM

## 2013-09-19 DIAGNOSIS — Z331 Pregnant state, incidental: Secondary | ICD-10-CM

## 2013-09-19 DIAGNOSIS — H6122 Impacted cerumen, left ear: Secondary | ICD-10-CM

## 2013-09-19 NOTE — ED Notes (Signed)
C/o earwax build up in her L ear onset 4 weeks ago.  C/o decreased hearing- sounds muffled.

## 2013-09-19 NOTE — ED Provider Notes (Signed)
CSN: [id number]     Arrival date & time 09/19/13  1752 History   None    No chief complaint on file.  (Consider location/radiation/quality/duration/timing/severity/associated sxs/prior Treatment) HPI  Aching in ears. Bilat. Used bobby pin and pulled out a large chunk of wax. Started 3-4 wks ago. Getting worse. Occasional HA and full feeling in ears. Denies szr or syncope or marked dizziness. Debrox x3 days w/o benefit   Past Medical History  Diagnosis Date  . No significant past medical history   . Loss of teeth due to extraction   . PCOS (polycystic ovarian syndrome)   . Medical history non-contributory   . Anxiety   . Headache(784.0)   . Bronchitis    Past Surgical History  Procedure Laterality Date  . Mouth surgery     Family History  Problem Relation Age of Onset  . Cancer Other   . Hypertension Brother    History  Substance Use Topics  . Smoking status: Former Smoker    Quit date: 10/27/2012  . Smokeless tobacco: Not on file  . Alcohol Use: No     Comment: Occassional Use   OB History   Grav Para Term Preterm Abortions TAB SAB Ect Mult Living   1 0 0 0 0 0 0 0 0 0      Review of Systems  Constitutional: Positive for fatigue.  HENT: Positive for ear pain.   Neurological: Positive for headaches.  All other systems reviewed and are negative.    Allergies  Latex  Home Medications   Current Outpatient Rx  Name  Route  Sig  Dispense  Refill  . acetaminophen (TYLENOL) 500 MG tablet   Oral   Take 1,000 mg by mouth every 6 (six) hours as needed for pain.         . Prenatal Vit-Fe Fumarate-FA (PRENATAL VITAMINS) 28-0.8 MG TABS   Oral   Take 1 tablet by mouth daily.   30 tablet   3   . promethazine (PHENERGAN) 25 MG tablet   Oral   Take 1 tablet (25 mg total) by mouth every 6 (six) hours as needed for nausea.   20 tablet   0    BP 128/73  Pulse 80  Temp(Src) 98.3 F (36.8 C) (Oral)  Resp 18  SpO2 100%  LMP 06/14/2013 Physical Exam   Constitutional: She is oriented to person, place, and time. She appears well-developed and well-nourished. No distress.  HENT:  R external ear canal nml TM nml. L external ear canal completely occluded by cerumen. No erythema of the canal   Pulmonary/Chest: Effort normal. No respiratory distress.  Abdominal: Soft.  Musculoskeletal: Normal range of motion.  Neurological: She is alert and oriented to person, place, and time. No cranial nerve deficit.  Skin: Skin is warm and dry. No rash noted. No erythema. No pallor.  Psychiatric: She has a normal mood and affect. Her behavior is normal. Judgment and thought content normal.    ED Course  Procedures (including critical care time) Labs Review Labs Reviewed - No data to display Imaging Review No results found.  EKG Interpretation     Ventricular Rate:    PR Interval:    QRS Duration:   QT Interval:    QTC Calculation:   R Axis:     Text Interpretation:              MDM   1. Cerumen impaction, left   2. Pregnancy   3. HA (headache)  22yo AAF w/ cerumen impaction that was removed in clinic today. HA chronically made worse w/ pregnancy from vasodilitation. Discussed home use of disimpaction kits in the future. Pt to f/u w/ OB regarding HA but would favor using ibuprofen as well as tylenol until 28-[redacted]wks gestations - precautions given - all questions answered.   Shelly Flatten, MD Family Medicine PGY-3 09/19/2013, 8:02 PM      Ozella Rocks, MD 09/19/13 2002

## 2013-09-20 NOTE — ED Provider Notes (Signed)
X-rays reviewed and report per radiologist.  James D Kindl, MD 09/20/13 2000 

## 2013-09-27 ENCOUNTER — Encounter: Payer: Self-pay | Admitting: Family Medicine

## 2013-09-27 ENCOUNTER — Ambulatory Visit (INDEPENDENT_AMBULATORY_CARE_PROVIDER_SITE_OTHER): Payer: Medicaid Other | Admitting: Family Medicine

## 2013-09-27 VITALS — BP 126/80 | Temp 97.1°F | Wt 216.3 lb

## 2013-09-27 DIAGNOSIS — Z3401 Encounter for supervision of normal first pregnancy, first trimester: Secondary | ICD-10-CM

## 2013-09-27 DIAGNOSIS — Z23 Encounter for immunization: Secondary | ICD-10-CM

## 2013-09-27 DIAGNOSIS — O99340 Other mental disorders complicating pregnancy, unspecified trimester: Secondary | ICD-10-CM

## 2013-09-27 LAB — US OB LIMITED

## 2013-09-27 NOTE — Progress Notes (Signed)
Informal [place] for FHR = 162 per PW doppler. FM also observed. Dr. Ike Bene notified

## 2013-09-27 NOTE — Progress Notes (Signed)
Kellie Deleon is a 22 y.o. G1P0000 at [redacted]w[redacted]d  presents for ROB  No lof, no vb, no ctx, no FM  FHT by [place]  Discussed with Patient:  - Recommend genetics screen (Quad screen next visit) - Patient plans on breast feeding. - Routine precautions discussed (depression, infection s/s).   Patient provided with all pertinent phone numbers for emergencies. - RTC for any VB, regular, painful cramps/ctxs occurring at a rate of >2/10 min, fever (100.5 or higher), n/v/d, any pain that is unresolving or worsening. - RTC in 4 weeks for next appt.  Problems: Patient Active Problem List   Diagnosis Date Noted  . 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 Supervision of normal first pregnancy in first trimester 09/01/2013  . Irregular menses 11/24/2012  . Anxiety 11/24/2012    To Do: 1. 18 week anatomy scan next visit.  Patient to schedule. 2. Quad screen next visit  [ ]  Vaccines: Flu: 11/12  Tdap:  [ ]  BCM:   Edu: [x ] PTL precautions; [ ]  BF class; [ ]  childbirth class; [ ]   BF counseling

## 2013-09-27 NOTE — Progress Notes (Signed)
Pulse- 98 Patient reports lower abdominal/pelvic pain; states she had HA's before pregnancy but now it is significantly worse, reports daily headaches

## 2013-09-27 NOTE — Patient Instructions (Signed)
Second Trimester of Pregnancy The second trimester is from week 13 through week 28, months 4 through 6. The second trimester is often a time when you feel your best. Your body has also adjusted to being pregnant, and you begin to feel better physically. Usually, morning sickness has lessened or quit completely, you may have more energy, and you may have an increase in appetite. The second trimester is also a time when the fetus is growing rapidly. At the end of the sixth month, the fetus is about 9 inches long and weighs about 1 pounds. You will likely begin to feel the baby move (quickening) between 18 and 20 weeks of the pregnancy. BODY CHANGES Your body goes through many changes during pregnancy. The changes vary from woman to woman.   Your weight will continue to increase. You will notice your lower abdomen bulging out.  You may begin to get stretch marks on your hips, abdomen, and breasts.  You may develop headaches that can be relieved by medicines approved by your caregiver.  You may urinate more often because the fetus is pressing on your bladder.  You may develop or continue to have heartburn as a result of your pregnancy.  You may develop constipation because certain hormones are causing the muscles that push waste through your intestines to slow down.  You may develop hemorrhoids or swollen, bulging veins (varicose veins).  You may have back pain because of the weight gain and pregnancy hormones relaxing your joints between the bones in your pelvis and as a result of a shift in weight and the muscles that support your balance.  Your breasts will continue to grow and be tender.  Your gums may bleed and may be sensitive to brushing and flossing.  Dark spots or blotches (chloasma, mask of pregnancy) may develop on your face. This will likely fade after the baby is born.  A dark line from your belly button to the pubic area (linea nigra) may appear. This will likely fade after the  baby is born. WHAT TO EXPECT AT YOUR PRENATAL VISITS During a routine prenatal visit:  You will be weighed to make sure you and the fetus are growing normally.  Your blood pressure will be taken.  Your abdomen will be measured to track your baby's growth.  The fetal heartbeat will be listened to.  Any test results from the previous visit will be discussed. Your caregiver may ask you:  How you are feeling.  If you are feeling the baby move.  If you have had any abnormal symptoms, such as leaking fluid, bleeding, severe headaches, or abdominal cramping.  If you have any questions. Other tests that may be performed during your second trimester include:  Blood tests that check for:  Low iron levels (anemia).  Gestational diabetes (between 24 and 28 weeks).  Rh antibodies.  Urine tests to check for infections, diabetes, or protein in the urine.  An ultrasound to confirm the proper growth and development of the baby.  An amniocentesis to check for possible genetic problems.  Fetal screens for spina bifida and Down syndrome. HOME CARE INSTRUCTIONS   Avoid all smoking, herbs, alcohol, and unprescribed drugs. These chemicals affect the formation and growth of the baby.  Follow your caregiver's instructions regarding medicine use. There are medicines that are either safe or unsafe to take during pregnancy.  Exercise only as directed by your caregiver. Experiencing uterine cramps is a good sign to stop exercising.  Continue to eat regular,   healthy meals.  Wear a good support bra for breast tenderness.  Do not use hot tubs, steam rooms, or saunas.  Wear your seat belt at all times when driving.  Avoid raw meat, uncooked cheese, cat litter boxes, and soil used by cats. These carry germs that can cause birth defects in the baby.  Take your prenatal vitamins.  Try taking a stool softener (if your caregiver approves) if you develop constipation. Eat more high-fiber foods,  such as fresh vegetables or fruit and whole grains. Drink plenty of fluids to keep your urine clear or pale yellow.  Take warm sitz baths to soothe any pain or discomfort caused by hemorrhoids. Use hemorrhoid cream if your caregiver approves.  If you develop varicose veins, wear support hose. Elevate your feet for 15 minutes, 3 4 times a day. Limit salt in your diet.  Avoid heavy lifting, wear low heel shoes, and practice good posture.  Rest with your legs elevated if you have leg cramps or low back pain.  Visit your dentist if you have not gone yet during your pregnancy. Use a soft toothbrush to brush your teeth and be gentle when you floss.  A sexual relationship may be continued unless your caregiver directs you otherwise.  Continue to go to all your prenatal visits as directed by your caregiver. SEEK MEDICAL CARE IF:   You have dizziness.  You have mild pelvic cramps, pelvic pressure, or nagging pain in the abdominal area.  You have persistent nausea, vomiting, or diarrhea.  You have a bad smelling vaginal discharge.  You have pain with urination. SEEK IMMEDIATE MEDICAL CARE IF:   You have a fever.  You are leaking fluid from your vagina.  You have spotting or bleeding from your vagina.  You have severe abdominal cramping or pain.  You have rapid weight gain or loss.  You have shortness of breath with chest pain.  You notice sudden or extreme swelling of your face, hands, ankles, feet, or legs.  You have not felt your baby move in over an hour.  You have severe headaches that do not go away with medicine.  You have vision changes. Document Released: 10/27/2001 Document Revised: 07/05/2013 Document Reviewed: 01/03/2013 Exit[employer] Patient Information 2014 [employer].  

## 2013-10-05 ENCOUNTER — Encounter: Payer: Self-pay | Admitting: *Deleted

## 2013-10-25 ENCOUNTER — Ambulatory Visit (INDEPENDENT_AMBULATORY_CARE_PROVIDER_SITE_OTHER): Payer: Medicaid Other | Admitting: Family

## 2013-10-25 VITALS — BP 131/79 | Temp 97.1°F | Wt 217.2 lb

## 2013-10-25 DIAGNOSIS — Z3401 Encounter for supervision of normal first pregnancy, first trimester: Secondary | ICD-10-CM

## 2013-10-25 DIAGNOSIS — O99340 Other mental disorders complicating pregnancy, unspecified trimester: Secondary | ICD-10-CM

## 2013-10-25 LAB — POCT URINALYSIS DIP (DEVICE)
Bilirubin Urine: NEGATIVE
Glucose, UA: NEGATIVE mg/dL
Ketones, ur: NEGATIVE mg/dL
Leukocytes, UA: NEGATIVE
Nitrite: NEGATIVE
Specific Gravity, Urine: 1.02 (ref 1.005–1.030)
pH: 7 (ref 5.0–8.0)

## 2013-10-25 NOTE — Progress Notes (Signed)
Doing well; no questions or concerns.  Schedule anatomy ultrasound and obtain quad screen today.

## 2013-10-25 NOTE — Progress Notes (Signed)
Pulse- 87 Patient reports pelvic pressure

## 2013-10-27 LAB — AFP, QUAD SCREEN
Age Alone: 1:1110 {titer}
Curr Gest Age: 17.5 wks.days
INH: 281.1 pg/mL
MoM for AFP: 1.74
MoM for hCG: 1.56
Tri 18 Scr Risk Est: NEGATIVE
Trisomy 18 (Edward) Syndrome Interp.: 1:182000 {titer}

## 2013-10-30 ENCOUNTER — Encounter: Payer: Self-pay | Admitting: Family

## 2013-10-31 ENCOUNTER — Ambulatory Visit (HOSPITAL_COMMUNITY)
Admission: RE | Admit: 2013-10-31 | Discharge: 2013-10-31 | Disposition: A | Discharge status: Home / Self Care | Payer: Medicaid Other | Source: Ambulatory Visit | Attending: Family | Admitting: Family

## 2013-10-31 ENCOUNTER — Other Ambulatory Visit: Payer: Self-pay | Admitting: Family

## 2013-10-31 DIAGNOSIS — Z3689 Encounter for other specified antenatal screening: Secondary | ICD-10-CM | POA: Insufficient documentation

## 2013-10-31 DIAGNOSIS — Z3401 Encounter for supervision of normal first pregnancy, first trimester: Secondary | ICD-10-CM

## 2013-11-05 ENCOUNTER — Encounter: Payer: Self-pay | Admitting: Family

## 2013-11-05 DIAGNOSIS — O4440 Low lying placenta NOS or without hemorrhage, unspecified trimester: Secondary | ICD-10-CM | POA: Insufficient documentation

## 2013-11-15 ENCOUNTER — Inpatient Hospital Stay (HOSPITAL_COMMUNITY)
Admission: AD | Admit: 2013-11-15 | Discharge: 2013-11-15 | Disposition: A | Discharge status: Home / Self Care | Payer: Medicaid Other | Source: Ambulatory Visit | Attending: Obstetrics & Gynecology | Admitting: Obstetrics & Gynecology

## 2013-11-15 ENCOUNTER — Encounter (HOSPITAL_COMMUNITY): Payer: Self-pay

## 2013-11-15 DIAGNOSIS — B373 Candidiasis of vulva and vagina: Secondary | ICD-10-CM

## 2013-11-15 DIAGNOSIS — B3731 Acute candidiasis of vulva and vagina: Secondary | ICD-10-CM | POA: Insufficient documentation

## 2013-11-15 DIAGNOSIS — O2390 Unspecified genitourinary tract infection in pregnancy, unspecified trimester: Secondary | ICD-10-CM | POA: Insufficient documentation

## 2013-11-15 DIAGNOSIS — R21 Rash and other nonspecific skin eruption: Secondary | ICD-10-CM | POA: Insufficient documentation

## 2013-11-15 DIAGNOSIS — Z87891 Personal history of nicotine dependence: Secondary | ICD-10-CM | POA: Insufficient documentation

## 2013-11-15 DIAGNOSIS — R109 Unspecified abdominal pain: Secondary | ICD-10-CM | POA: Insufficient documentation

## 2013-11-15 DIAGNOSIS — K6289 Other specified diseases of anus and rectum: Secondary | ICD-10-CM | POA: Insufficient documentation

## 2013-11-15 LAB — OB RESULTS CONSOLE GC/CHLAMYDIA
CHLAMYDIA, DNA PROBE: NEGATIVE
GC PROBE AMP, GENITAL: NEGATIVE

## 2013-11-15 LAB — WET PREP, GENITAL
Clue Cells Wet Prep HPF POC: NONE SEEN
Trich, Wet Prep: NONE SEEN
Yeast Wet Prep HPF POC: NONE SEEN

## 2013-11-15 LAB — URINALYSIS, ROUTINE W REFLEX MICROSCOPIC
Bilirubin Urine: NEGATIVE
Glucose, UA: NEGATIVE mg/dL
Ketones, ur: NEGATIVE mg/dL
Nitrite: NEGATIVE
Specific Gravity, Urine: 1.02 (ref 1.005–1.030)
Urobilinogen, UA: 0.2 mg/dL (ref 0.0–1.0)
pH: 6.5 (ref 5.0–8.0)

## 2013-11-15 MED ORDER — ACETAMINOPHEN 500 MG PO TABS
1000.0000 mg | ORAL_TABLET | Freq: Once | ORAL | Status: AC
  Administered 2013-11-15: 1000 mg via ORAL
  Filled 2013-11-15: qty 2

## 2013-11-15 MED ORDER — FLUCONAZOLE 150 MG PO TABS
150.0000 mg | ORAL_TABLET | Freq: Once | ORAL | Status: AC
  Administered 2013-11-15: 150 mg via ORAL
  Filled 2013-11-15: qty 1

## 2013-11-15 MED ORDER — DOCUSATE SODIUM 100 MG PO CAPS
100.0000 mg | ORAL_CAPSULE | Freq: Two times a day (BID) | ORAL | Status: DC
Start: 2013-11-15 — End: 2014-02-25

## 2013-11-15 MED ORDER — FLUCONAZOLE 150 MG PO TABS: ORAL_TABLET | ORAL | Status: DC

## 2013-11-15 MED ORDER — NYSTATIN-TRIAMCINOLONE 100000-0.1 UNIT/GM-% EX CREA
TOPICAL_CREAM | Freq: Once | CUTANEOUS | Status: AC
  Administered 2013-11-15: 13:00:00 via TOPICAL
  Filled 2013-11-15: qty 15

## 2013-11-15 NOTE — MAU Provider Note (Signed)
History     CSN: [id number]  Arrival date and time: 11/15/13 6213   First Provider Initiated Contact with Patient 11/15/13 1102      Chief Complaint  Patient presents with  . Abdominal Pain  . Rectal Pain   HPI  Pt is G1P000 @[redacted]w[redacted]d  who presents with lower abd pain in middle and bilateral lower abd pain. Pt c/o of rectal pressure and ?cut on outside of vagina.   Rn note: Patient states she has had lower abdominal pain for about one week, when sitting has rectal pain. Has a rash on the labia for about one week, No bleeding or discharge. Has felt some fetal movement   Past Medical History  Diagnosis Date  . No significant past medical history   . Loss of teeth due to extraction   . PCOS (polycystic ovarian syndrome)   . Medical history non-contributory   . Anxiety   . Headache(784.0)   . Bronchitis     Past Surgical History  Procedure Laterality Date  . Mouth surgery      Family History  Problem Relation Age of Onset  . Cancer Other   . Hypertension Brother     History  Substance Use Topics  . Smoking status: Former Smoker    Quit date: 10/27/2012  . Smokeless tobacco: Not on file  . Alcohol Use: No     Comment: Occassional Use    Allergies:  Allergies  Allergen Reactions  . Latex Rash and Other (See Comments)    Burning     Prescriptions prior to admission  Medication Sig Dispense Refill  . acetaminophen (TYLENOL) 500 MG tablet Take 1,000 mg by mouth every 6 (six) hours as needed for pain.      . Prenatal Vit-Fe Fumarate-FA (PRENATAL VITAMINS) 28-0.8 MG TABS Take 1 tablet by mouth daily.  30 tablet  3  . promethazine (PHENERGAN) 25 MG tablet Take 1 tablet (25 mg total) by mouth every 6 (six) hours as needed for nausea.  20 tablet  0    Review of Systems  Constitutional: Negative for fever and chills.  Gastrointestinal: Positive for constipation. Negative for nausea, vomiting and abdominal pain.  Genitourinary: Positive for dysuria. Negative for  urgency and frequency.   Physical Exam   Blood pressure 126/72, pulse 94, temperature 98.7 F (37.1 C), temperature source Oral, resp. rate 20, height 5' 5.5" (1.664 m), weight 101.606 kg (224 lb), last menstrual period 06/14/2013, SpO2 100.00%.  Physical Exam  Nursing note and vitals reviewed. Constitutional: She is oriented to person, place, and time. She appears well-developed and well-nourished. No distress.  HENT:  Head: Normocephalic.  Eyes: Pupils are equal, round, and reactive to light.  Neck: Normal range of motion. Neck supple.  Cardiovascular: Normal rate.   Respiratory: Effort normal.  GI: Soft. She exhibits no distension. There is no tenderness. There is no rebound and no guarding.  FHR 145 bpm with doppler  Genitourinary:  Labia edematous and reddened; fissure between left labia minora and majora; vaginal mucosa reddened; cervix closed, long; uterus appropriate size for GA.   Musculoskeletal: Normal range of motion.  Neurological: She is alert and oriented to person, place, and time.  Skin: Skin is warm and dry.  Psychiatric: She has a normal mood and affect.    MAU Course  Procedures Results for orders placed during the hospital encounter of 11/15/13 (from the past 24 hour(s))  URINALYSIS, ROUTINE W REFLEX MICROSCOPIC     Status: Abnormal   Collection  Time    11/15/13 10:01 AM      Result Value Range   Color, Urine YELLOW  YELLOW   APPearance CLOUDY (*) CLEAR   Specific Gravity, Urine 1.020  1.005 - 1.030   pH 6.5  5.0 - 8.0   Glucose, UA NEGATIVE  NEGATIVE mg/dL   Hgb urine dipstick NEGATIVE  NEGATIVE   Bilirubin Urine NEGATIVE  NEGATIVE   Ketones, ur NEGATIVE  NEGATIVE mg/dL   Protein, ur NEGATIVE  NEGATIVE mg/dL   Urobilinogen, UA 0.2  0.0 - 1.0 mg/dL   Nitrite NEGATIVE  NEGATIVE   Leukocytes, UA NEGATIVE  NEGATIVE  WET PREP, GENITAL     Status: Abnormal   Collection Time    11/15/13 11:45 AM      Result Value Range   Yeast Wet Prep HPF POC NONE SEEN   NONE SEEN   Trich, Wet Prep NONE SEEN  NONE SEEN   Clue Cells Wet Prep HPF POC NONE SEEN  NONE SEEN   WBC, Wet Prep HPF POC FEW (*) NONE SEEN  yeast clinically mycolog and Diflucan given in MAU   Assessment and Plan  Yeast vulvovaginitis Round ligament pain  LINEBERRY,SUSAN 11/15/2013, 11:02 AM

## 2013-11-15 NOTE — MAU Note (Signed)
Patient states she has had lower abdominal pain for about one week, when sitting has rectal pain. Has a rash on the labia for about one week, No bleeding or discharge. Has felt some fetal movement

## 2013-11-16 DIAGNOSIS — O24419 Gestational diabetes mellitus in pregnancy, unspecified control: Secondary | ICD-10-CM

## 2013-11-16 DIAGNOSIS — O139 Gestational [pregnancy-induced] hypertension without significant proteinuria, unspecified trimester: Secondary | ICD-10-CM

## 2013-11-16 HISTORY — DX: Gestational (pregnancy-induced) hypertension without significant proteinuria, unspecified trimester: O13.9

## 2013-11-16 HISTORY — DX: Gestational diabetes mellitus in pregnancy, unspecified control: O24.419

## 2013-11-16 NOTE — L&D Delivery Note (Signed)
Attestation of Attending Supervision of Obstetric Fellow: Evaluation and management procedures were performed by the Obstetric Fellow under my supervision and collaboration.  I have reviewed the Obstetric Fellow's note and chart, and I agree with the management and plan.  Dio Giller, MD, FACOG Attending Obstetrician & Gynecologist Faculty Practice, [facility]   

## 2013-11-16 NOTE — L&D Delivery Note (Signed)
Delivery Note At 3:07 PM a viable female was delivered via Vaginal, Spontaneous Delivery (Presentation: Middle Occiput Posterior).  APGAR: , ; weight .   Placenta status: Intact, Spontaneous.  Cord: 3 vessels with the following complications: .  Cord pH: not sent  Anesthesia: Epidural  Episiotomy: None Lacerations: 1st degree Suture Repair: 3.0 monocryl on CT Est. Blood Loss (mL): 250  Mom to postpartum.  Baby to Couplet care / Skin to Skin.  Called to delivery. Mother pushed over 1st degree tear in direct OP. Infant delivered to maternal abdomen. Cord clamped and cut. Active management of 3rd stage with traction with pit to follow. Placenta delivered intact with 3v cord. Tear repaired with 3.0 monocryl on CT in usual manner. EBL 250. Counts correct. Hemostatic.   Kellie BalsamMichael R Jeovany Deleon 03/10/2014, 3:20 PM

## 2013-11-17 LAB — GC/CHLAMYDIA PROBE AMP
CT Probe RNA: NEGATIVE
GC Probe RNA: NEGATIVE

## 2013-11-18 NOTE — MAU Provider Note (Signed)
Attestation of Attending Supervision of Advanced Practitioner (CNM/NP): Evaluation and management procedures were performed by the Advanced Practitioner under my supervision and collaboration. I have reviewed the Advanced Practitioner's note and chart, and I agree with the management and plan.  Julina Altmann H. 1:02 PM

## 2013-11-22 ENCOUNTER — Encounter: Payer: Self-pay | Admitting: Obstetrics & Gynecology

## 2013-11-22 ENCOUNTER — Ambulatory Visit (INDEPENDENT_AMBULATORY_CARE_PROVIDER_SITE_OTHER): Payer: Medicaid Other | Admitting: Obstetrics and Gynecology

## 2013-11-22 VITALS — BP 116/77 | Temp 98.4°F | Wt 224.3 lb

## 2013-11-22 DIAGNOSIS — O210 Mild hyperemesis gravidarum: Secondary | ICD-10-CM

## 2013-11-22 DIAGNOSIS — O219 Vomiting of pregnancy, unspecified: Secondary | ICD-10-CM | POA: Insufficient documentation

## 2013-11-22 LAB — POCT URINALYSIS DIP (DEVICE)
BILIRUBIN URINE: NEGATIVE
Glucose, UA: NEGATIVE mg/dL
Hgb urine dipstick: NEGATIVE
Ketones, ur: NEGATIVE mg/dL
LEUKOCYTES UA: NEGATIVE
NITRITE: NEGATIVE
Protein, ur: NEGATIVE mg/dL
Specific Gravity, Urine: 1.015 (ref 1.005–1.030)
UROBILINOGEN UA: 0.2 mg/dL (ref 0.0–1.0)
pH: 7 (ref 5.0–8.0)

## 2013-11-22 MED ORDER — PROMETHAZINE HCL 12.5 MG PO TABS: 12.5000 mg | ORAL_TABLET | Freq: Four times a day (QID) | ORAL | Status: DC | PRN

## 2013-11-22 NOTE — Progress Notes (Signed)
P=91,

## 2013-11-22 NOTE — Patient Instructions (Signed)
Morning Sickness °Morning sickness is when you feel sick to your stomach (nauseous) during pregnancy. This nauseous feeling may or may not come with vomiting. It often occurs in the morning but can be a problem any time of day. Morning sickness is most common during the first trimester, but it may continue throughout pregnancy. While morning sickness is unpleasant, it is usually harmless unless you develop severe and continual vomiting (hyperemesis gravidarum). This condition requires more intense treatment.  °CAUSES  °The cause of morning sickness is not completely known but seems to be related to normal hormonal changes that occur in pregnancy. °RISK FACTORS °You are at greater risk if you: °· Experienced nausea or vomiting before your pregnancy. °· Had morning sickness during a previous pregnancy. °· Are pregnant with more than one baby, such as twins. °TREATMENT  °Do not use any medicines (prescription, over-the-counter, or herbal) for morning sickness without first talking to your health care provider. Your health care provider may prescribe or recommend: °· Vitamin B6 supplements. °· Anti-nausea medicines. °· The herbal medicine ginger. °HOME CARE INSTRUCTIONS  °· Only take over-the-counter or prescription medicines as directed by your health care provider. °· Taking multivitamins before getting pregnant can prevent or decrease the severity of morning sickness in most women.   °· Eat a piece of dry toast or unsalted crackers before getting out of bed in the morning.   °· Eat five or six small meals a day.   °· Eat dry and bland foods (rice, baked potato ). Foods high in carbohydrates are often helpful.  °· Do not drink liquids with your meals. Drink liquids between meals.   °· Avoid greasy, fatty, and spicy foods.   °· Get someone to cook for you if the smell of any food causes nausea and vomiting.   °· If you feel nauseous after taking prenatal vitamins, take the vitamins at night or with a snack.  °· Snack  on protein foods (nuts, yogurt, cheese) between meals if you are hungry.   °· Eat unsweetened gelatins for desserts.   °· Wearing an acupressure wristband (worn for sea sickness) may be helpful.   °· Acupuncture may be helpful.   °· Do not smoke.   °· Get a humidifier to keep the air in your house free of odors.   °· Get plenty of fresh air. °SEEK MEDICAL CARE IF:  °· Your home remedies are not working, and you need medicine. °· You feel dizzy or lightheaded. °· You are losing weight. °SEEK IMMEDIATE MEDICAL CARE IF:  °· You have persistent and uncontrolled nausea and vomiting. °· You pass out (faint). °Document Released: 12/24/2006 Document Revised: 07/05/2013 Document Reviewed: 04/19/2013 °ExitCare® Patient Information ©2014 [employer]. ° °

## 2013-11-22 NOTE — Progress Notes (Signed)
US scheduled to F/U LL placenta

## 2013-11-22 NOTE — Progress Notes (Signed)
Doing well[facility] [employer] [facility] [employer] [facility] RLP better. Was treated for yeast last wk, no sx. Still occ vomiting and wants refill Phenergan

## 2013-12-20 ENCOUNTER — Ambulatory Visit (HOSPITAL_COMMUNITY)
Admission: RE | Admit: 2013-12-20 | Discharge: 2013-12-20 | Disposition: A | Discharge status: Home / Self Care | Payer: Medicaid Other | Source: Ambulatory Visit | Attending: Obstetrics and Gynecology | Admitting: Obstetrics and Gynecology

## 2013-12-20 ENCOUNTER — Ambulatory Visit (INDEPENDENT_AMBULATORY_CARE_PROVIDER_SITE_OTHER): Payer: Medicaid Other | Admitting: Obstetrics and Gynecology

## 2013-12-20 ENCOUNTER — Encounter: Payer: Self-pay | Admitting: Obstetrics and Gynecology

## 2013-12-20 VITALS — BP 125/77 | Temp 97.0°F | Wt 228.0 lb

## 2013-12-20 DIAGNOSIS — O4410 Placenta previa with hemorrhage, unspecified trimester: Secondary | ICD-10-CM

## 2013-12-20 DIAGNOSIS — O4400 Placenta previa specified as without hemorrhage, unspecified trimester: Secondary | ICD-10-CM | POA: Insufficient documentation

## 2013-12-20 DIAGNOSIS — Z3401 Encounter for supervision of normal first pregnancy, first trimester: Secondary | ICD-10-CM

## 2013-12-20 DIAGNOSIS — O4440 Low lying placenta NOS or without hemorrhage, unspecified trimester: Secondary | ICD-10-CM

## 2013-12-20 DIAGNOSIS — Z3689 Encounter for other specified antenatal screening: Secondary | ICD-10-CM | POA: Insufficient documentation

## 2013-12-20 DIAGNOSIS — O219 Vomiting of pregnancy, unspecified: Secondary | ICD-10-CM

## 2013-12-20 DIAGNOSIS — Z3400 Encounter for supervision of normal first pregnancy, unspecified trimester: Secondary | ICD-10-CM

## 2013-12-20 LAB — POCT URINALYSIS DIP (DEVICE)
Bilirubin Urine: NEGATIVE
GLUCOSE, UA: NEGATIVE mg/dL
HGB URINE DIPSTICK: NEGATIVE
KETONES UR: NEGATIVE mg/dL
Leukocytes, UA: NEGATIVE
NITRITE: NEGATIVE
PH: 7 (ref 5.0–8.0)
Protein, ur: NEGATIVE mg/dL
Specific Gravity, Urine: 1.02 (ref 1.005–1.030)
Urobilinogen, UA: 0.2 mg/dL (ref 0.0–1.0)

## 2013-12-20 NOTE — Patient Instructions (Signed)
Third Trimester of Pregnancy  The third trimester is from week 29 through week 42, months 7 through 9. The third trimester is a time when the fetus is growing rapidly. At the end of the ninth month, the fetus is about 20 inches in length and weighs 6 10 pounds.   BODY CHANGES  Your body goes through many changes during pregnancy. The changes vary from woman to woman.    Your weight will continue to increase. You can expect to gain 25 35 pounds (11 16 kg) by the end of the pregnancy.   You may begin to get stretch marks on your hips, abdomen, and breasts.   You may urinate more often because the fetus is moving lower into your pelvis and pressing on your bladder.   You may develop or continue to have heartburn as a result of your pregnancy.   You may develop constipation because certain hormones are causing the muscles that push waste through your intestines to slow down.   You may develop hemorrhoids or swollen, bulging veins (varicose veins).   You may have pelvic pain because of the weight gain and pregnancy hormones relaxing your joints between the bones in your pelvis. Back aches may result from over exertion of the muscles supporting your posture.   Your breasts will continue to grow and be tender. A yellow discharge may leak from your breasts called colostrum.   Your belly button may stick out.   You may feel short of breath because of your expanding uterus.   You may notice the fetus "dropping," or moving lower in your abdomen.   You may have a bloody mucus discharge. This usually occurs a few days to a week before labor begins.   Your cervix becomes thin and soft (effaced) near your due date.  WHAT TO EXPECT AT YOUR PRENATAL EXAMS   You will have prenatal exams every 2 weeks until week 36. Then, you will have weekly prenatal exams. During a routine prenatal visit:   You will be weighed to make sure you and the fetus are growing normally.   Your blood pressure is taken.   Your abdomen will be  measured to track your baby's growth.   The fetal heartbeat will be listened to.   Any test results from the previous visit will be discussed.   You may have a cervical check near your due date to see if you have effaced.  At around 36 weeks, your caregiver will check your cervix. At the same time, your caregiver will also perform a test on the secretions of the vaginal tissue. This test is to determine if a type of bacteria, Group B streptococcus, is present. Your caregiver will explain this further.  Your caregiver may ask you:   What your birth plan is.   How you are feeling.   If you are feeling the baby move.   If you have had any abnormal symptoms, such as leaking fluid, bleeding, severe headaches, or abdominal cramping.   If you have any questions.  Other tests or screenings that may be performed during your third trimester include:   Blood tests that check for low iron levels (anemia).   Fetal testing to check the health, activity level, and growth of the fetus. Testing is done if you have certain medical conditions or if there are problems during the pregnancy.  FALSE LABOR  You may feel small, irregular contractions that eventually go away. These are called Braxton Hicks contractions, or   false labor. Contractions may last for hours, days, or even weeks before true labor sets in. If contractions come at regular intervals, intensify, or become painful, it is best to be seen by your caregiver.   SIGNS OF LABOR    Menstrual-like cramps.   Contractions that are 5 minutes apart or less.   Contractions that start on the top of the uterus and spread down to the lower abdomen and back.   A sense of increased pelvic pressure or back pain.   A watery or bloody mucus discharge that comes from the vagina.  If you have any of these signs before the 37th week of pregnancy, call your caregiver right away. You need to go to the hospital to get checked immediately.  HOME CARE INSTRUCTIONS    Avoid all  smoking, herbs, alcohol, and unprescribed drugs. These chemicals affect the formation and growth of the baby.   Follow your caregiver's instructions regarding medicine use. There are medicines that are either safe or unsafe to take during pregnancy.   Exercise only as directed by your caregiver. Experiencing uterine cramps is a good sign to stop exercising.   Continue to eat regular, healthy meals.   Wear a good support bra for breast tenderness.   Do not use hot tubs, steam rooms, or saunas.   Wear your seat belt at all times when driving.   Avoid raw meat, uncooked cheese, cat litter boxes, and soil used by cats. These carry germs that can cause birth defects in the baby.   Take your prenatal vitamins.   Try taking a stool softener (if your caregiver approves) if you develop constipation. Eat more high-fiber foods, such as fresh vegetables or fruit and whole grains. Drink plenty of fluids to keep your urine clear or pale yellow.   Take warm sitz baths to soothe any pain or discomfort caused by hemorrhoids. Use hemorrhoid cream if your caregiver approves.   If you develop varicose veins, wear support hose. Elevate your feet for 15 minutes, 3 4 times a day. Limit salt in your diet.   Avoid heavy lifting, wear low heal shoes, and practice good posture.   Rest a lot with your legs elevated if you have leg cramps or low back pain.   Visit your dentist if you have not gone during your pregnancy. Use a soft toothbrush to brush your teeth and be gentle when you floss.   A sexual relationship may be continued unless your caregiver directs you otherwise.   Do not travel far distances unless it is absolutely necessary and only with the approval of your caregiver.   Take prenatal classes to understand, practice, and ask questions about the labor and delivery.   Make a trial run to the hospital.   Pack your hospital bag.   Prepare the baby's nursery.   Continue to go to all your prenatal visits as directed  by your caregiver.  SEEK MEDICAL CARE IF:   You are unsure if you are in labor or if your water has broken.   You have dizziness.   You have mild pelvic cramps, pelvic pressure, or nagging pain in your abdominal area.   You have persistent nausea, vomiting, or diarrhea.   You have a bad smelling vaginal discharge.   You have pain with urination.  SEEK IMMEDIATE MEDICAL CARE IF:    You have a fever.   You are leaking fluid from your vagina.   You have spotting or bleeding from your vagina.     You have severe abdominal cramping or pain.   You have rapid weight loss or gain.   You have shortness of breath with chest pain.   You notice sudden or extreme swelling of your face, hands, ankles, feet, or legs.   You have not felt your baby move in over an hour.   You have severe headaches that do not go away with medicine.   You have vision changes.  Document Released: 10/27/2001 Document Revised: 07/05/2013 Document Reviewed: 01/03/2013  Exit[employer] Patient Information 2014 [employer].

## 2013-12-20 NOTE — Progress Notes (Signed)
Pulse 89 Non-pitting edema on hands and feet.

## 2013-12-20 NOTE — Progress Notes (Signed)
Still with some nausea, occasional vomiting. Vomited today. Phenergan helps. US reviewed> all normal, 47th %ile. Appetite poor> Discussed eating small, frequent nutritious meals.

## 2014-01-10 ENCOUNTER — Encounter: Payer: Self-pay | Admitting: Family Medicine

## 2014-01-10 ENCOUNTER — Ambulatory Visit (INDEPENDENT_AMBULATORY_CARE_PROVIDER_SITE_OTHER): Payer: Medicaid Other | Admitting: Family Medicine

## 2014-01-10 VITALS — BP 130/75 | Temp 97.7°F | Wt 230.6 lb

## 2014-01-10 DIAGNOSIS — O219 Vomiting of pregnancy, unspecified: Secondary | ICD-10-CM

## 2014-01-10 DIAGNOSIS — Z3400 Encounter for supervision of normal first pregnancy, unspecified trimester: Secondary | ICD-10-CM

## 2014-01-10 DIAGNOSIS — Z23 Encounter for immunization: Secondary | ICD-10-CM

## 2014-01-10 DIAGNOSIS — Z3401 Encounter for supervision of normal first pregnancy, first trimester: Secondary | ICD-10-CM

## 2014-01-10 DIAGNOSIS — O210 Mild hyperemesis gravidarum: Secondary | ICD-10-CM

## 2014-01-10 LAB — POCT URINALYSIS DIP (DEVICE)
Bilirubin Urine: NEGATIVE
Glucose, UA: NEGATIVE mg/dL
Hgb urine dipstick: NEGATIVE
Ketones, ur: NEGATIVE mg/dL
Leukocytes, UA: NEGATIVE
Nitrite: NEGATIVE
Protein, ur: NEGATIVE mg/dL
Specific Gravity, Urine: 1.02 (ref 1.005–1.030)
Urobilinogen, UA: 0.2 mg/dL (ref 0.0–1.0)
pH: 7 (ref 5.0–8.0)

## 2014-01-10 LAB — GLUCOSE TOLERANCE, 1 HOUR (50G) W/O FASTING: GLUCOSE 1 HOUR GTT: 137 mg/dL (ref 70–140)

## 2014-01-10 LAB — CBC
HEMATOCRIT: 35.7 % — AB (ref 36.0–46.0)
Hemoglobin: 12.3 g/dL (ref 12.0–15.0)
MCH: 30.9 pg (ref 26.0–34.0)
MCHC: 34.5 g/dL (ref 30.0–36.0)
MCV: 89.7 fL (ref 78.0–100.0)
Platelets: 221 10*3/uL (ref 150–400)
RBC: 3.98 MIL/uL (ref 3.87–5.11)
RDW: 12.9 % (ref 11.5–15.5)
WBC: 13.7 10*3/uL — ABNORMAL HIGH (ref 4.0–10.5)

## 2014-01-10 LAB — RPR

## 2014-01-10 MED ORDER — PRENATAL VITAMINS 28-0.8 MG PO TABS: 1.0000 | ORAL_TABLET | Freq: Every day | ORAL | Status: DC

## 2014-01-10 MED ORDER — TETANUS-DIPHTH-ACELL PERTUSSIS 5-2.5-18.5 LF-MCG/0.5 IM SUSP
0.5000 mL | Freq: Once | INTRAMUSCULAR | Status: AC
  Administered 2014-01-10: 0.5 mL via INTRAMUSCULAR

## 2014-01-10 NOTE — Patient Instructions (Signed)
Third Trimester of Pregnancy  The third trimester is from week 29 through week 42, months 7 through 9. The third trimester is a time when the fetus is growing rapidly. At the end of the ninth month, the fetus is about 20 inches in length and weighs 6 10 pounds.   BODY CHANGES  Your body goes through many changes during pregnancy. The changes vary from woman to woman.    Your weight will continue to increase. You can expect to gain 25 35 pounds (11 16 kg) by the end of the pregnancy.   You may begin to get stretch marks on your hips, abdomen, and breasts.   You may urinate more often because the fetus is moving lower into your pelvis and pressing on your bladder.   You may develop or continue to have heartburn as a result of your pregnancy.   You may develop constipation because certain hormones are causing the muscles that push waste through your intestines to slow down.   You may develop hemorrhoids or swollen, bulging veins (varicose veins).   You may have pelvic pain because of the weight gain and pregnancy hormones relaxing your joints between the bones in your pelvis. Back aches may result from over exertion of the muscles supporting your posture.   Your breasts will continue to grow and be tender. A yellow discharge may leak from your breasts called colostrum.   Your belly button may stick out.   You may feel short of breath because of your expanding uterus.   You may notice the fetus "dropping," or moving lower in your abdomen.   You may have a bloody mucus discharge. This usually occurs a few days to a week before labor begins.   Your cervix becomes thin and soft (effaced) near your due date.  WHAT TO EXPECT AT YOUR PRENATAL EXAMS   You will have prenatal exams every 2 weeks until week 36. Then, you will have weekly prenatal exams. During a routine prenatal visit:   You will be weighed to make sure you and the fetus are growing normally.   Your blood pressure is taken.   Your abdomen will be  measured to track your baby's growth.   The fetal heartbeat will be listened to.   Any test results from the previous visit will be discussed.   You may have a cervical check near your due date to see if you have effaced.  At around 36 weeks, your caregiver will check your cervix. At the same time, your caregiver will also perform a test on the secretions of the vaginal tissue. This test is to determine if a type of bacteria, Group B streptococcus, is present. Your caregiver will explain this further.  Your caregiver may ask you:   What your birth plan is.   How you are feeling.   If you are feeling the baby move.   If you have had any abnormal symptoms, such as leaking fluid, bleeding, severe headaches, or abdominal cramping.   If you have any questions.  Other tests or screenings that may be performed during your third trimester include:   Blood tests that check for low iron levels (anemia).   Fetal testing to check the health, activity level, and growth of the fetus. Testing is done if you have certain medical conditions or if there are problems during the pregnancy.  FALSE LABOR  You may feel small, irregular contractions that eventually go away. These are called Braxton Hicks contractions, or   false labor. Contractions may last for hours, days, or even weeks before true labor sets in. If contractions come at regular intervals, intensify, or become painful, it is best to be seen by your caregiver.   SIGNS OF LABOR    Menstrual-like cramps.   Contractions that are 5 minutes apart or less.   Contractions that start on the top of the uterus and spread down to the lower abdomen and back.   A sense of increased pelvic pressure or back pain.   A watery or bloody mucus discharge that comes from the vagina.  If you have any of these signs before the 37th week of pregnancy, call your caregiver right away. You need to go to the hospital to get checked immediately.  HOME CARE INSTRUCTIONS    Avoid all  smoking, herbs, alcohol, and unprescribed drugs. These chemicals affect the formation and growth of the baby.   Follow your caregiver's instructions regarding medicine use. There are medicines that are either safe or unsafe to take during pregnancy.   Exercise only as directed by your caregiver. Experiencing uterine cramps is a good sign to stop exercising.   Continue to eat regular, healthy meals.   Wear a good support bra for breast tenderness.   Do not use hot tubs, steam rooms, or saunas.   Wear your seat belt at all times when driving.   Avoid raw meat, uncooked cheese, cat litter boxes, and soil used by cats. These carry germs that can cause birth defects in the baby.   Take your prenatal vitamins.   Try taking a stool softener (if your caregiver approves) if you develop constipation. Eat more high-fiber foods, such as fresh vegetables or fruit and whole grains. Drink plenty of fluids to keep your urine clear or pale yellow.   Take warm sitz baths to soothe any pain or discomfort caused by hemorrhoids. Use hemorrhoid cream if your caregiver approves.   If you develop varicose veins, wear support hose. Elevate your feet for 15 minutes, 3 4 times a day. Limit salt in your diet.   Avoid heavy lifting, wear low heal shoes, and practice good posture.   Rest a lot with your legs elevated if you have leg cramps or low back pain.   Visit your dentist if you have not gone during your pregnancy. Use a soft toothbrush to brush your teeth and be gentle when you floss.   A sexual relationship may be continued unless your caregiver directs you otherwise.   Do not travel far distances unless it is absolutely necessary and only with the approval of your caregiver.   Take prenatal classes to understand, practice, and ask questions about the labor and delivery.   Make a trial run to the hospital.   Pack your hospital bag.   Prepare the baby's nursery.   Continue to go to all your prenatal visits as directed  by your caregiver.  SEEK MEDICAL CARE IF:   You are unsure if you are in labor or if your water has broken.   You have dizziness.   You have mild pelvic cramps, pelvic pressure, or nagging pain in your abdominal area.   You have persistent nausea, vomiting, or diarrhea.   You have a bad smelling vaginal discharge.   You have pain with urination.  SEEK IMMEDIATE MEDICAL CARE IF:    You have a fever.   You are leaking fluid from your vagina.   You have spotting or bleeding from your vagina.     You have severe abdominal cramping or pain.   You have rapid weight loss or gain.   You have shortness of breath with chest pain.   You notice sudden or extreme swelling of your face, hands, ankles, feet, or legs.   You have not felt your baby move in over an hour.   You have severe headaches that do not go away with medicine.   You have vision changes.  Document Released: 10/27/2001 Document Revised: 07/05/2013 Document Reviewed: 01/03/2013  Exit[employer] Patient Information 2014 [employer].

## 2014-01-10 NOTE — Addendum Note (Signed)
Addended by: Faythe CasaBELLAMY, Hali Balgobin M on: 01/10/2014 10:16 AM   Modules accepted: Orders

## 2014-01-10 NOTE — Progress Notes (Signed)
Pt also has been having episodes of emesis and head 5 episodes of diarrhea.  No blood in vomit or diarrhea.  Another person at school with similar symptoms. Recommend phenergan PRN and immodium PRN  +FM, no LOF, no vb, no ctx  Kellie Deleon is a 23 y.o. G1P0000 at 7452w5d by L=5 here for ROB visit.  Discussed with Patient:  -Plans to breast feed.  All questions answered. -Continue prenatal vitamins. -Reviewed genetics screen (Quad screen / first trimester screen / serum integrated screen / full integrated screen done/ not done).   -Reviewed fetal kick counts (Pt to perform daily at a time when the baby is active, lie laterally with both hands on belly in quiet room and count all movements (hiccups, shoulder rolls, obvious kicks, etc); pt is to report to clinic or MAU for less than 10 movements felt in a one hour time period-pt told as soon as she counts 10 movements the count is complete.)  - Routine precautions discussed (depression, infection s/s).   Patient provided with all pertinent phone numbers for emergencies. - RTC for any VB, regular, painful cramps/ctxs occurring at a rate of >2/10 min, fever (100.5 or higher), n/v/d, any pain that is unresolving or worsening, LOF, decreased fetal movement, CP, SOB, edema  Problems: Patient Active Problem List   Diagnosis Date Noted  . Nausea and vomiting in pregnancy prior to [redacted] weeks gestation 11/22/2013  . Low-lying placenta 11/05/2013  . 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 Supervision of normal first pregnancy in first trimester 09/01/2013  . Irregular menses 11/24/2012    To Do: 1. Glucose tolerance test ordered.  Patient will draw in clinic.  Will f/u test and amend plan based on results. 2. CBC, RPR and antibody screen ordered. 3. Tdap    Edu: [ ]  PTL precautions; [ ]  BF class; [ ]  childbirth class; [ ]   BF counseling;

## 2014-01-10 NOTE — Addendum Note (Signed)
Addended by: Minta BalsamDOM, [place] Depaz R on: 01/10/2014 08:53 AM   Modules accepted: Orders

## 2014-01-10 NOTE — Progress Notes (Signed)
P= 86 C/o of cramping; states she had N/V/D all day yesterday.

## 2014-01-11 ENCOUNTER — Encounter: Payer: Self-pay | Admitting: Family Medicine

## 2014-01-11 ENCOUNTER — Telehealth: Payer: Self-pay | Admitting: *Deleted

## 2014-01-11 NOTE — Telephone Encounter (Signed)
Message copied by Gerome ApleyZEYFANG, LINDA L on Thu Jan 11, 2014 12:59 PM ------      Message from: Jolyn LentDOM, MICHAEL R      Created: Thu Jan 11, 2014 11:12 AM       Please schedule for 3hr glucose            Tawana ScaleMichael Ryan Odom, MD      OB Fellow       ------

## 2014-01-11 NOTE — Telephone Encounter (Signed)
Called Kellie Deleon and notified her of abnormal 1 hr GTT and need for 3 hr GTT.  Explained instructions. Kellie Deleon agreed to 01/15/14 0800 appt.

## 2014-01-15 ENCOUNTER — Other Ambulatory Visit: Payer: Medicaid Other

## 2014-01-15 DIAGNOSIS — O99810 Abnormal glucose complicating pregnancy: Secondary | ICD-10-CM

## 2014-01-16 LAB — GLUCOSE TOLERANCE, 3 HOURS
GLUCOSE, 2 HOUR-GESTATIONAL: 139 mg/dL (ref 70–164)
GLUCOSE, FASTING-GESTATIONAL: 76 mg/dL (ref 70–104)
Glucose Tolerance, 1 hour: 140 mg/dL (ref 70–189)
Glucose, GTT - 3 Hour: 109 mg/dL (ref 70–144)

## 2014-01-22 ENCOUNTER — Encounter: Payer: Self-pay | Admitting: Obstetrics and Gynecology

## 2014-01-24 ENCOUNTER — Encounter: Payer: Self-pay | Admitting: Family Medicine

## 2014-01-24 ENCOUNTER — Ambulatory Visit (INDEPENDENT_AMBULATORY_CARE_PROVIDER_SITE_OTHER): Payer: Medicaid Other | Admitting: Family Medicine

## 2014-01-24 VITALS — BP 126/76 | Temp 97.4°F | Wt 232.0 lb

## 2014-01-24 DIAGNOSIS — O210 Mild hyperemesis gravidarum: Secondary | ICD-10-CM

## 2014-01-24 DIAGNOSIS — O219 Vomiting of pregnancy, unspecified: Secondary | ICD-10-CM

## 2014-01-24 DIAGNOSIS — Z3401 Encounter for supervision of normal first pregnancy, first trimester: Secondary | ICD-10-CM

## 2014-01-24 LAB — POCT URINALYSIS DIP (DEVICE)
Bilirubin Urine: NEGATIVE
GLUCOSE, UA: NEGATIVE mg/dL
Hgb urine dipstick: NEGATIVE
Ketones, ur: NEGATIVE mg/dL
LEUKOCYTES UA: NEGATIVE
NITRITE: NEGATIVE
PROTEIN: NEGATIVE mg/dL
Specific Gravity, Urine: 1.015 (ref 1.005–1.030)
UROBILINOGEN UA: 0.2 mg/dL (ref 0.0–1.0)
pH: 6.5 (ref 5.0–8.0)

## 2014-01-24 MED ORDER — PRENATAL VITAMINS 28-0.8 MG PO TABS: 1.0000 | ORAL_TABLET | Freq: Every day | ORAL | Status: DC

## 2014-01-24 NOTE — Patient Instructions (Signed)
Third Trimester of Pregnancy  The third trimester is from week 29 through week 42, months 7 through 9. The third trimester is a time when the fetus is growing rapidly. At the end of the ninth month, the fetus is about 20 inches in length and weighs 6 10 pounds.   BODY CHANGES  Your body goes through many changes during pregnancy. The changes vary from woman to woman.    Your weight will continue to increase. You can expect to gain 25 35 pounds (11 16 kg) by the end of the pregnancy.   You may begin to get stretch marks on your hips, abdomen, and breasts.   You may urinate more often because the fetus is moving lower into your pelvis and pressing on your bladder.   You may develop or continue to have heartburn as a result of your pregnancy.   You may develop constipation because certain hormones are causing the muscles that push waste through your intestines to slow down.   You may develop hemorrhoids or swollen, bulging veins (varicose veins).   You may have pelvic pain because of the weight gain and pregnancy hormones relaxing your joints between the bones in your pelvis. Back aches may result from over exertion of the muscles supporting your posture.   Your breasts will continue to grow and be tender. A yellow discharge may leak from your breasts called colostrum.   Your belly button may stick out.   You may feel short of breath because of your expanding uterus.   You may notice the fetus "dropping," or moving lower in your abdomen.   You may have a bloody mucus discharge. This usually occurs a few days to a week before labor begins.   Your cervix becomes thin and soft (effaced) near your due date.  WHAT TO EXPECT AT YOUR PRENATAL EXAMS   You will have prenatal exams every 2 weeks until week 36. Then, you will have weekly prenatal exams. During a routine prenatal visit:   You will be weighed to make sure you and the fetus are growing normally.   Your blood pressure is taken.   Your abdomen will be  measured to track your baby's growth.   The fetal heartbeat will be listened to.   Any test results from the previous visit will be discussed.   You may have a cervical check near your due date to see if you have effaced.  At around 36 weeks, your caregiver will check your cervix. At the same time, your caregiver will also perform a test on the secretions of the vaginal tissue. This test is to determine if a type of bacteria, Group B streptococcus, is present. Your caregiver will explain this further.  Your caregiver may ask you:   What your birth plan is.   How you are feeling.   If you are feeling the baby move.   If you have had any abnormal symptoms, such as leaking fluid, bleeding, severe headaches, or abdominal cramping.   If you have any questions.  Other tests or screenings that may be performed during your third trimester include:   Blood tests that check for low iron levels (anemia).   Fetal testing to check the health, activity level, and growth of the fetus. Testing is done if you have certain medical conditions or if there are problems during the pregnancy.  FALSE LABOR  You may feel small, irregular contractions that eventually go away. These are called Braxton Hicks contractions, or   false labor. Contractions may last for hours, days, or even weeks before true labor sets in. If contractions come at regular intervals, intensify, or become painful, it is best to be seen by your caregiver.   SIGNS OF LABOR    Menstrual-like cramps.   Contractions that are 5 minutes apart or less.   Contractions that start on the top of the uterus and spread down to the lower abdomen and back.   A sense of increased pelvic pressure or back pain.   A watery or bloody mucus discharge that comes from the vagina.  If you have any of these signs before the 37th week of pregnancy, call your caregiver right away. You need to go to the hospital to get checked immediately.  HOME CARE INSTRUCTIONS    Avoid all  smoking, herbs, alcohol, and unprescribed drugs. These chemicals affect the formation and growth of the baby.   Follow your caregiver's instructions regarding medicine use. There are medicines that are either safe or unsafe to take during pregnancy.   Exercise only as directed by your caregiver. Experiencing uterine cramps is a good sign to stop exercising.   Continue to eat regular, healthy meals.   Wear a good support bra for breast tenderness.   Do not use hot tubs, steam rooms, or saunas.   Wear your seat belt at all times when driving.   Avoid raw meat, uncooked cheese, cat litter boxes, and soil used by cats. These carry germs that can cause birth defects in the baby.   Take your prenatal vitamins.   Try taking a stool softener (if your caregiver approves) if you develop constipation. Eat more high-fiber foods, such as fresh vegetables or fruit and whole grains. Drink plenty of fluids to keep your urine clear or pale yellow.   Take warm sitz baths to soothe any pain or discomfort caused by hemorrhoids. Use hemorrhoid cream if your caregiver approves.   If you develop varicose veins, wear support hose. Elevate your feet for 15 minutes, 3 4 times a day. Limit salt in your diet.   Avoid heavy lifting, wear low heal shoes, and practice good posture.   Rest a lot with your legs elevated if you have leg cramps or low back pain.   Visit your dentist if you have not gone during your pregnancy. Use a soft toothbrush to brush your teeth and be gentle when you floss.   A sexual relationship may be continued unless your caregiver directs you otherwise.   Do not travel far distances unless it is absolutely necessary and only with the approval of your caregiver.   Take prenatal classes to understand, practice, and ask questions about the labor and delivery.   Make a trial run to the hospital.   Pack your hospital bag.   Prepare the baby's nursery.   Continue to go to all your prenatal visits as directed  by your caregiver.  SEEK MEDICAL CARE IF:   You are unsure if you are in labor or if your water has broken.   You have dizziness.   You have mild pelvic cramps, pelvic pressure, or nagging pain in your abdominal area.   You have persistent nausea, vomiting, or diarrhea.   You have a bad smelling vaginal discharge.   You have pain with urination.  SEEK IMMEDIATE MEDICAL CARE IF:    You have a fever.   You are leaking fluid from your vagina.   You have spotting or bleeding from your vagina.     You have severe abdominal cramping or pain.   You have rapid weight loss or gain.   You have shortness of breath with chest pain.   You notice sudden or extreme swelling of your face, hands, ankles, feet, or legs.   You have not felt your baby move in over an hour.   You have severe headaches that do not go away with medicine.   You have vision changes.  Document Released: 10/27/2001 Document Revised: 07/05/2013 Document Reviewed: 01/03/2013  Exit[employer] Patient Information 2014 [employer].

## 2014-01-24 NOTE — Progress Notes (Signed)
+  FM, no lof, no vb, no ctx Issues with sleep. Difficulty going to sleep and maintaining sleep Recommend antihistamine  [name] is a 23 y.o. G1P0000 at 3576w5d by L=5 here for ROB visit.  Discussed with Patient:  -Plans to breast feed.  All questions answered. -Continue prenatal vitamins. -Reviewed fetal kick counts Pt to perform daily at a time when the baby is active, lie laterally with both hands on belly in quiet room and count all movements (hiccups, shoulder rolls, obvious kicks, etc); pt is to report to clinic L&D for less than 10 movements felt in a one hour time period-pt told as soon as she counts 10 movements the count is complete.  - Routine precautions discussed (depression, infection s/s).   Patient provided with all pertinent phone numbers for emergencies. - RTC for any VB, regular, painful cramps/ctxs occurring at a rate of >2/10 min, fever (100.5 or higher), n/v/d, any pain that is unresolving or worsening, LOF, decreased fetal movement, CP, SOB, edema - RTC in 2 weeks for next appt. - Did GBS swabs today and will f/u results and call if abnormal.  Contact#:  Pt has no drug allergies, so will get PCN if GBS+ OR will get sensitivities on GBS swab as pt is PCN-allergic.  Problems: Patient Active Problem List   Diagnosis Date Noted  . Nausea and vomiting in pregnancy prior to [redacted] weeks gestation 11/22/2013  . Low-lying placenta 11/05/2013  . 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 Supervision of normal first pregnancy in first trimester 09/01/2013  . Irregular menses 11/24/2012    To Do: 1. Tdap will be given today  [x ] Vaccines: recd [ ]  BCM: undecided [ ]  Readiness: baby has a place to sleep, car seat, other baby necessities.  Edu: [x ] PTL precautions; [ ]  BF class; [ ]  childbirth class; [ ]   BF counseling;

## 2014-01-24 NOTE — Progress Notes (Signed)
p=91 

## 2014-01-26 ENCOUNTER — Inpatient Hospital Stay (HOSPITAL_COMMUNITY)
Admission: AD | Admit: 2014-01-26 | Discharge: 2014-01-27 | Disposition: A | Discharge status: Home / Self Care | Payer: Medicaid Other | Source: Ambulatory Visit | Attending: Family Medicine | Admitting: Family Medicine

## 2014-01-26 ENCOUNTER — Encounter (HOSPITAL_COMMUNITY): Payer: Self-pay

## 2014-01-26 DIAGNOSIS — Z87891 Personal history of nicotine dependence: Secondary | ICD-10-CM | POA: Insufficient documentation

## 2014-01-26 DIAGNOSIS — O26899 Other specified pregnancy related conditions, unspecified trimester: Secondary | ICD-10-CM

## 2014-01-26 DIAGNOSIS — O99340 Other mental disorders complicating pregnancy, unspecified trimester: Secondary | ICD-10-CM

## 2014-01-26 DIAGNOSIS — Y92009 Unspecified place in unspecified non-institutional (private) residence as the place of occurrence of the external cause: Secondary | ICD-10-CM | POA: Insufficient documentation

## 2014-01-26 DIAGNOSIS — IMO0002 Reserved for concepts with insufficient information to code with codable children: Secondary | ICD-10-CM

## 2014-01-26 DIAGNOSIS — R109 Unspecified abdominal pain: Secondary | ICD-10-CM | POA: Insufficient documentation

## 2014-01-26 DIAGNOSIS — O9989 Other specified diseases and conditions complicating pregnancy, childbirth and the puerperium: Principal | ICD-10-CM

## 2014-01-26 DIAGNOSIS — O99891 Other specified diseases and conditions complicating pregnancy: Secondary | ICD-10-CM | POA: Insufficient documentation

## 2014-01-26 LAB — URINALYSIS, ROUTINE W REFLEX MICROSCOPIC
Bilirubin Urine: NEGATIVE
GLUCOSE, UA: NEGATIVE mg/dL
Hgb urine dipstick: NEGATIVE
KETONES UR: NEGATIVE mg/dL
Leukocytes, UA: NEGATIVE
NITRITE: NEGATIVE
Protein, ur: NEGATIVE mg/dL
Specific Gravity, Urine: 1.02 (ref 1.005–1.030)
Urobilinogen, UA: 0.2 mg/dL (ref 0.0–1.0)
pH: 6.5 (ref 5.0–8.0)
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
## 2014-01-27 DIAGNOSIS — O26899 Other specified pregnancy related conditions, unspecified trimester: Secondary | ICD-10-CM

## 2014-01-27 DIAGNOSIS — R109 Unspecified abdominal pain: Secondary | ICD-10-CM

## 2014-01-27 DIAGNOSIS — O99340 Other mental disorders complicating pregnancy, unspecified trimester: Secondary | ICD-10-CM

## 2014-01-27 DIAGNOSIS — T7411XA Adult physical abuse, confirmed, initial encounter: Secondary | ICD-10-CM

## 2014-01-27 NOTE — MAU Provider Note (Signed)
Attestation of Attending Supervision of Advanced Practitioner (PA/CNM/NP): Evaluation and management procedures were performed by the Advanced Practitioner under my supervision and collaboration.  I have reviewed the Advanced Practitioner's note and chart, and I agree with the management and plan.  Reva BoresPRATT,TANYA S, MD [facility] Attending 01/27/2014 2:19 AM

## 2014-01-27 NOTE — Discharge Instructions (Signed)
Abdominal Pain During Pregnancy °Abdominal pain is common in pregnancy. Most of the time, it does not cause harm. There are many causes of abdominal pain. Some causes are more serious than others. Some of the causes of abdominal pain in pregnancy are easily diagnosed. Occasionally, the diagnosis takes time to understand. Other times, the cause is not determined. Abdominal pain can be a sign that something is very wrong with the pregnancy, or the pain may have nothing to do with the pregnancy at all. For this reason, always tell your health care provider if you have any abdominal discomfort. °HOME CARE INSTRUCTIONS  °Monitor your abdominal pain for any changes. The following actions may help to alleviate any discomfort you are experiencing: °· Do not have sexual intercourse or put anything in your vagina until your symptoms go away completely. °· Get plenty of rest until your pain improves. °· Drink clear fluids if you feel nauseous. Avoid solid food as long as you are uncomfortable or nauseous. °· Only take over-the-counter or prescription medicine as directed by your health care provider. °· Keep all follow-up appointments with your health care provider. °SEEK IMMEDIATE MEDICAL CARE IF: °· You are bleeding, leaking fluid, or passing tissue from the vagina. °· You have increasing pain or cramping. °· You have persistent vomiting. °· You have painful or bloody urination. °· You have a fever. °· You notice a decrease in your baby's movements. °· You have extreme weakness or feel faint. °· You have shortness of breath, with or without abdominal pain. °· You develop a severe headache with abdominal pain. °· You have abnormal vaginal discharge with abdominal pain. °· You have persistent diarrhea. °· You have abdominal pain that continues even after rest, or gets worse. °MAKE SURE YOU:  °· Understand these instructions. °· Will watch your condition. °· Will get help right away if you are not doing well or get  worse. °Document Released: 11/02/2005 Document Revised: 08/23/2013 Document Reviewed: 06/01/2013 °Exit[employer]® Patient Information ©2014 [employer]. ° °
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
## 2014-02-07 ENCOUNTER — Encounter: Payer: Self-pay | Admitting: Family

## 2014-02-07 ENCOUNTER — Ambulatory Visit (INDEPENDENT_AMBULATORY_CARE_PROVIDER_SITE_OTHER): Payer: Medicaid Other | Admitting: Family

## 2014-02-07 VITALS — BP 135/80 | Temp 98.2°F | Wt 230.0 lb

## 2014-02-07 DIAGNOSIS — Z3400 Encounter for supervision of normal first pregnancy, unspecified trimester: Secondary | ICD-10-CM

## 2014-02-07 DIAGNOSIS — Z3401 Encounter for supervision of normal first pregnancy, first trimester: Secondary | ICD-10-CM

## 2014-02-07 LAB — POCT URINALYSIS DIP (DEVICE)
Bilirubin Urine: NEGATIVE
GLUCOSE, UA: NEGATIVE mg/dL
Hgb urine dipstick: NEGATIVE
Ketones, ur: NEGATIVE mg/dL
Leukocytes, UA: NEGATIVE
Nitrite: NEGATIVE
SPECIFIC GRAVITY, URINE: 1.02 (ref 1.005–1.030)
UROBILINOGEN UA: 0.2 mg/dL (ref 0.0–1.0)
pH: 7 (ref 5.0–8.0)

## 2014-02-07 NOTE — Progress Notes (Signed)
P= 92 C/o of intermittent lower abdominal/pelvic pressure and braxton hicks.  Pt. Asked when she could stop working; informed pt. That she can work up to delivery so long as pregnancy progresses normally and physician doesn't tell her otherwise.

## 2014-02-07 NOTE — Progress Notes (Signed)
No report of headache, vision changes or epigastric pain.

## 2014-02-07 NOTE — Progress Notes (Signed)
Pt denies headache, vision changes or epigastric pain.  Consulted with Dr. Debroah LoopArnold > since BP normal, obtain baseline labs.

## 2014-02-12 ENCOUNTER — Other Ambulatory Visit: Payer: Self-pay | Admitting: *Deleted

## 2014-02-12 ENCOUNTER — Other Ambulatory Visit: Payer: Medicaid Other

## 2014-02-12 DIAGNOSIS — Z3401 Encounter for supervision of normal first pregnancy, first trimester: Secondary | ICD-10-CM

## 2014-02-12 LAB — CBC
HCT: 38.1 % (ref 36.0–46.0)
Hemoglobin: 12.9 g/dL (ref 12.0–15.0)
MCH: 31 pg (ref 26.0–34.0)
MCHC: 33.9 g/dL (ref 30.0–36.0)
MCV: 91.6 fL (ref 78.0–100.0)
Platelets: 226 10*3/uL (ref 150–400)
RBC: 4.16 MIL/uL (ref 3.87–5.11)
RDW: 13.5 % (ref 11.5–15.5)
WBC: 13.1 10*3/uL — AB (ref 4.0–10.5)

## 2014-02-12 LAB — COMPREHENSIVE METABOLIC PANEL
ALBUMIN: 3.6 g/dL (ref 3.5–5.2)
ALT: 11 U/L (ref 0–35)
AST: 14 U/L (ref 0–37)
Alkaline Phosphatase: 133 U/L — ABNORMAL HIGH (ref 39–117)
BUN: 4 mg/dL — AB (ref 6–23)
CALCIUM: 9 mg/dL (ref 8.4–10.5)
CHLORIDE: 104 meq/L (ref 96–112)
CO2: 23 meq/L (ref 19–32)
Creat: 0.53 mg/dL (ref 0.50–1.10)
Glucose, Bld: 85 mg/dL (ref 70–99)
POTASSIUM: 3.9 meq/L (ref 3.5–5.3)
Sodium: 137 mEq/L (ref 135–145)
Total Bilirubin: 0.4 mg/dL (ref 0.2–1.2)
Total Protein: 6.1 g/dL (ref 6.0–8.3)

## 2014-02-12 NOTE — Addendum Note (Signed)
Addended by: Aldona LentoFISHER, Aahana Elza L on: 02/12/2014 12:40 PM   Modules accepted: Orders

## 2014-02-12 NOTE — Addendum Note (Signed)
Addended by: Aldona LentoFISHER, Venessa Wickham L on: 02/12/2014 12:32 PM   Modules accepted: Orders

## 2014-02-13 ENCOUNTER — Encounter: Payer: Self-pay | Admitting: Family Medicine

## 2014-02-13 DIAGNOSIS — O1213 Gestational proteinuria, third trimester: Secondary | ICD-10-CM | POA: Insufficient documentation

## 2014-02-13 LAB — CREATININE CLEARANCE, URINE, 24 HOUR
CREATININE: 0.53 mg/dL (ref 0.50–1.10)
Creatinine Clearance: 244 mL/min — ABNORMAL HIGH (ref 75–115)
Creatinine, 24H Ur: 1866 mg/d — ABNORMAL HIGH (ref 700–1800)
Creatinine, Urine: 70.4 mg/dL

## 2014-02-13 LAB — PROTEIN, URINE, 24 HOUR
PROTEIN, URINE: 15 mg/dL
Protein, 24H Urine: 398 mg/d — ABNORMAL HIGH (ref 50–100)

## 2014-02-14 ENCOUNTER — Ambulatory Visit (INDEPENDENT_AMBULATORY_CARE_PROVIDER_SITE_OTHER): Payer: Medicaid Other | Admitting: Advanced Practice Midwife

## 2014-02-14 ENCOUNTER — Telehealth: Payer: Self-pay | Admitting: Advanced Practice Midwife

## 2014-02-14 VITALS — BP 132/81 | Wt 232.8 lb

## 2014-02-14 DIAGNOSIS — Z23 Encounter for immunization: Secondary | ICD-10-CM

## 2014-02-14 DIAGNOSIS — O210 Mild hyperemesis gravidarum: Secondary | ICD-10-CM

## 2014-02-14 DIAGNOSIS — O26839 Pregnancy related renal disease, unspecified trimester: Secondary | ICD-10-CM

## 2014-02-14 DIAGNOSIS — O1213 Gestational proteinuria, third trimester: Secondary | ICD-10-CM

## 2014-02-14 DIAGNOSIS — O4410 Placenta previa with hemorrhage, unspecified trimester: Secondary | ICD-10-CM

## 2014-02-14 DIAGNOSIS — R51 Headache: Secondary | ICD-10-CM

## 2014-02-14 DIAGNOSIS — O99340 Other mental disorders complicating pregnancy, unspecified trimester: Secondary | ICD-10-CM

## 2014-02-14 DIAGNOSIS — H6120 Impacted cerumen, unspecified ear: Secondary | ICD-10-CM

## 2014-02-14 DIAGNOSIS — Z3400 Encounter for supervision of normal first pregnancy, unspecified trimester: Secondary | ICD-10-CM

## 2014-02-14 DIAGNOSIS — R809 Proteinuria, unspecified: Secondary | ICD-10-CM

## 2014-02-14 DIAGNOSIS — Z331 Pregnant state, incidental: Secondary | ICD-10-CM

## 2014-02-14 LAB — POCT URINALYSIS DIP (DEVICE)
BILIRUBIN URINE: NEGATIVE
GLUCOSE, UA: NEGATIVE mg/dL
Hgb urine dipstick: NEGATIVE
Ketones, ur: NEGATIVE mg/dL
LEUKOCYTES UA: NEGATIVE
NITRITE: NEGATIVE
Protein, ur: NEGATIVE mg/dL
Specific Gravity, Urine: 1.015 (ref 1.005–1.030)
Urobilinogen, UA: 0.2 mg/dL (ref 0.0–1.0)
pH: 7 (ref 5.0–8.0)

## 2014-02-14 NOTE — Telephone Encounter (Signed)
Called pt. And informed her that her urine results were negative for protein. Pt. Verbalized understanding and gratitude and had no questions or concerns.

## 2014-02-14 NOTE — Progress Notes (Signed)
Diarrhea for the past week. Reassurance about skin discolorations during the pregnancy. Headaches partially relieved with tylenol.  Discussed elevated protein levels in 24 hr urine.  Endorses +FM. Denies regular contractions, VB, LOF, Visual disturbances and Epigastric pain. Consulted with Dr. Erin FullingHarraway-Smith >> schedule pt for a growth ultrasound and weekly clinic visits.

## 2014-02-14 NOTE — Telephone Encounter (Signed)
Pt worried about proteinuria from last visit, urine dip results were not available during prenatal visit.  Called to pt to notify her of results.  Urine dip negative in office today, 02/14/14.

## 2014-02-14 NOTE — Progress Notes (Signed)
I have seen this patient today and agree with the previous midwife student's note.  Consult Dr Erin FullingHarraway-Smith.  Growth U/S ordered, preeclampsia precautions given, prenatal visit in 1 week. Addendum:  Urine dip today negative for protein.   LEFTWICH-KIRBY, Gracin Soohoo Certified Nurse-Midwife

## 2014-02-14 NOTE — Progress Notes (Signed)
Pulse: 116 Pt has pain in her pubic bone. Also reports skin darkening and flaking.  Patient reports diarrhea for the past week. No one else at home has been sick.

## 2014-02-21 ENCOUNTER — Ambulatory Visit (INDEPENDENT_AMBULATORY_CARE_PROVIDER_SITE_OTHER): Payer: Medicaid Other | Admitting: Obstetrics and Gynecology

## 2014-02-21 ENCOUNTER — Ambulatory Visit (HOSPITAL_COMMUNITY)
Admission: RE | Admit: 2014-02-21 | Discharge: 2014-02-21 | Disposition: A | Discharge status: Home / Self Care | Payer: Medicaid Other | Source: Ambulatory Visit | Attending: Advanced Practice Midwife | Admitting: Advanced Practice Midwife

## 2014-02-21 VITALS — BP 123/83 | Temp 97.8°F | Wt 232.9 lb

## 2014-02-21 DIAGNOSIS — Z3401 Encounter for supervision of normal first pregnancy, first trimester: Secondary | ICD-10-CM

## 2014-02-21 DIAGNOSIS — Z3400 Encounter for supervision of normal first pregnancy, unspecified trimester: Secondary | ICD-10-CM

## 2014-02-21 DIAGNOSIS — O1213 Gestational proteinuria, third trimester: Secondary | ICD-10-CM

## 2014-02-21 DIAGNOSIS — O4400 Placenta previa specified as without hemorrhage, unspecified trimester: Secondary | ICD-10-CM | POA: Insufficient documentation

## 2014-02-21 DIAGNOSIS — O3660X0 Maternal care for excessive fetal growth, unspecified trimester, not applicable or unspecified: Secondary | ICD-10-CM | POA: Insufficient documentation

## 2014-02-21 LAB — POCT URINALYSIS DIP (DEVICE)
BILIRUBIN URINE: NEGATIVE
GLUCOSE, UA: NEGATIVE mg/dL
Hgb urine dipstick: NEGATIVE
LEUKOCYTES UA: NEGATIVE
Nitrite: NEGATIVE
Protein, ur: NEGATIVE mg/dL
Specific Gravity, Urine: 1.015 (ref 1.005–1.030)
Urobilinogen, UA: 0.2 mg/dL (ref 0.0–1.0)
pH: 7 (ref 5.0–8.0)

## 2014-02-21 NOTE — Progress Notes (Signed)
For growth scan today. GFM. Discussed healthy diet. Transient proteinuria seems to have resolved and BP stable. Some hand edema.

## 2014-02-21 NOTE — Patient Instructions (Signed)
Pregnancy, The Father's Role A father has an important role during their partners pregnancy, labor, delivery and afterward. It is important to help and support your partner through this new period. There are many physical and emotional changes that happen. To be helpful and supportive during this time, you should know and understand what is happening to your partner during the pregnancy, labor, delivery and postpartum period.  PREGNANCY Pregnancy lasts 40 weeks (plus or minus 2 weeks). The pregnancy is divided into three trimesters.   In the first 13 weeks, the mother feels tired, has painful breasts, may feel sick to her stomach (nauseated), throw up (vomit), urinates more often and may have mood changes. All of these changes are normal. If the father is aware of these, he can be more helpful, supportive and understanding. This may include helping with household duties and activities and spending more time with each other.  In the next 14 to 28 weeks, your partner is over the tiredness, nausea and vomiting. She will likely feel better and more energetic. This is the best time of the pregnancy to be more active together, go out more often or take trips. You will be able to see her belly popping out with the pregnancy. You may be able to feel the baby kick.  In the last 12 weeks, she may become more uncomfortable again because her abdomen is popping out more as the baby grows. She may have a hard time doing household chores, her balance may be off, she may have a hard time bending over, tires easily and has a tough time sleeping. At this time, you will realize the birth of your baby is close. You and your partner may have concerns about the safety of your partner and if the baby will be normal and healthy. These are all normal and natural feelings. You should talk with each other and your caregiver if you have any questions. Attend prenatal care visits with your partner. This is a good time for you to get  to know your caregiver, follow the pregnancy and ask questions. Prenatal visits are once a month for 6 months, then they are every 2 weeks for 2 months and then once a week the last month. You may have more prenatal visits if your caregiver feels it is needed. Your caregiver usually does an ultrasound of the baby at one of the prenatal visits or more often if needed. It is an exciting and emotional to see the baby moving and the heart beating.  Fathers can experience emotional changes during this time as well. These emotions can include happiness, excitement and feeling proud. Fathers may also be concerned about having new responsibilities. These include financial, educational and if it will change the relationship with his partner. These feelings are normal. They should be talked about openly and positively with each other. An important and often asked question is if sexual intercourse is safe during pregnancy and if it will harm the baby. Sexual intercourse is safe unless there is a problem with the pregnancy and your caregiver advises you to not have sexual intercourse. Because physical and emotional changes happen in pregnancy, your partner may not want to have sex during certain times. This is mostly true in the first and third trimesters. Trying different positions may make sexual intercourse comfortable. It is important for the both of you to discuss your feelings and desires with this problem. Talk to your caregiver about any questions you have about sexual intercourse during the   pregnancy. LABOR AND DELIVERY There are childbirth classes available for couples to take together. They help you understand what happens during labor and delivery. They also teach you how to help your partner with her labor pains, how to relax, breath properly during a contraction and focus on what is happening during labor. You may be asked to time the contractions, massage her back and breath with her during the contractions.  You are also there to see and enjoy the excitement your baby being born. If you have any feelings of fainting or are uncomfortable, tell someone to help you. You may be asked to leave the room if a problem develops during the labor or delivery. Sometimes a Cesarean Section (C-section) is scheduled or is an emergency during labor and delivery. A C-section is a major operation to deliver the baby. It is done through an incision in the abdomen and uterus. Your partner will be given a medicine to make her sleep (general anesthesia) or spinal anesthesia (numbing the body from the waist down). Most hospitals allow the father in the room for a C-section unless it is an emergency. Recovery from a C-section takes longer, is more uncomfortable and will require more help from the father. AFTER DELIVERY After the baby is born, the mother goes through many changes again. These changes could last 4 to 6 weeks or longer following a C-section. It is not unusual to be anxious, concerned and afraid that you may not be taking care of your newborn baby properly. Your partner may take a while to regain her strength. She may also get feelings of sadness (postpartum blues or depression), which is a more serious condition that may require medical treatment.  Your partner may decide to breastfeed the baby. This helps with bonding between the mother and the baby. Breastfeeding is the best way to feed the baby, but you may feel "left out." However, you can feel included by burping the baby and bottle feeding the baby with breast milk (collected by the mother) to give your partner some rest. This also helps you to bond with the baby. Breastfeeding mothers can get pregnant even if they are not having menstrual periods. Therefore, some form of birth control should be used if you do not want to get pregnant. Another question and concern is when it is safe to have sexual intercourse again. Usually it takes 4 to 6 weeks for healing to be over  with. It may take longer after a C-section. If you have any questions about having sexual intercourse or if it is painful, talk to your caregiver. As a father, you will be adjusting your role as the baby grows. Fatherhood is a on-going learning experience. You and your partner should still make time to be together alone and be the couple you were before the baby was born. This is helpful for you, your partner and your baby. As you can see, it is important for a father to be helpful, understanding and supportive during this special time. Document Released: 04/20/2008 Document Revised: 01/25/2012 Document Reviewed: 04/20/2008 [employer] Patient Information 2014 [employer].  

## 2014-02-21 NOTE — Progress Notes (Signed)
[phone] 

## 2014-02-25 ENCOUNTER — Encounter (HOSPITAL_COMMUNITY): Payer: Self-pay | Admitting: *Deleted

## 2014-02-25 ENCOUNTER — Inpatient Hospital Stay (HOSPITAL_COMMUNITY)
Admission: AD | Admit: 2014-02-25 | Discharge: 2014-02-25 | Disposition: A | Discharge status: Home / Self Care | Payer: Medicaid Other | Source: Ambulatory Visit | Attending: Obstetrics & Gynecology | Admitting: Obstetrics & Gynecology

## 2014-02-25 DIAGNOSIS — H6120 Impacted cerumen, unspecified ear: Secondary | ICD-10-CM

## 2014-02-25 DIAGNOSIS — Z3400 Encounter for supervision of normal first pregnancy, unspecified trimester: Secondary | ICD-10-CM

## 2014-02-25 DIAGNOSIS — O99340 Other mental disorders complicating pregnancy, unspecified trimester: Secondary | ICD-10-CM

## 2014-02-25 DIAGNOSIS — Z23 Encounter for immunization: Secondary | ICD-10-CM

## 2014-02-25 DIAGNOSIS — IMO0002 Reserved for concepts with insufficient information to code with codable children: Secondary | ICD-10-CM | POA: Insufficient documentation

## 2014-02-25 DIAGNOSIS — R51 Headache: Secondary | ICD-10-CM

## 2014-02-25 DIAGNOSIS — O210 Mild hyperemesis gravidarum: Secondary | ICD-10-CM

## 2014-02-25 DIAGNOSIS — Z87891 Personal history of nicotine dependence: Secondary | ICD-10-CM | POA: Insufficient documentation

## 2014-02-25 DIAGNOSIS — O4410 Placenta previa with hemorrhage, unspecified trimester: Secondary | ICD-10-CM

## 2014-02-25 LAB — COMPREHENSIVE METABOLIC PANEL
ALBUMIN: 2.9 g/dL — AB (ref 3.5–5.2)
ALK PHOS: 157 U/L — AB (ref 39–117)
ALT: 10 U/L (ref 0–35)
AST: 13 U/L (ref 0–37)
BILIRUBIN TOTAL: 0.3 mg/dL (ref 0.3–1.2)
BUN: 4 mg/dL — AB (ref 6–23)
CHLORIDE: 101 meq/L (ref 96–112)
CO2: 21 mEq/L (ref 19–32)
Calcium: 9.3 mg/dL (ref 8.4–10.5)
Creatinine, Ser: 0.57 mg/dL (ref 0.50–1.10)
GFR calc Af Amer: >90 mL/min (ref >90)
GFR calc non Af Amer: >90 mL/min (ref >90)
Glucose, Bld: 95 mg/dL (ref 70–99)
POTASSIUM: 3.5 meq/L — AB (ref 3.7–5.3)
Sodium: 137 mEq/L (ref 137–147)
Total Protein: 6 g/dL (ref 6.0–8.3)

## 2014-02-25 LAB — CBC WITH DIFFERENTIAL/PLATELET
Basophils Absolute: 0 10*3/uL (ref 0.0–0.1)
Basophils Relative: 0 % (ref 0–1)
Eosinophils Absolute: 0.1 10*3/uL (ref 0.0–0.7)
Eosinophils Relative: 1 % (ref 0–5)
HCT: 35.6 % — ABNORMAL LOW (ref 36.0–46.0)
HEMOGLOBIN: 12.5 g/dL (ref 12.0–15.0)
Lymphocytes Relative: 12 % (ref 12–46)
Lymphs Abs: 1.6 10*3/uL (ref 0.7–4.0)
MCH: 32.6 pg (ref 26.0–34.0)
MCHC: 35.1 g/dL (ref 30.0–36.0)
MCV: 93 fL (ref 78.0–100.0)
Monocytes Absolute: 1.2 10*3/uL — ABNORMAL HIGH (ref 0.1–1.0)
Monocytes Relative: 9 % (ref 3–12)
NEUTROS ABS: 10.1 10*3/uL — AB (ref 1.7–7.7)
NEUTROS PCT: 78 % — AB (ref 43–77)
Platelets: 197 10*3/uL (ref 150–400)
RBC: 3.83 MIL/uL — AB (ref 3.87–5.11)
RDW: 13 % (ref 11.5–15.5)
WBC: 12.9 10*3/uL — ABNORMAL HIGH (ref 4.0–10.5)

## 2014-02-25 LAB — URINALYSIS, ROUTINE W REFLEX MICROSCOPIC
Bilirubin Urine: NEGATIVE
Glucose, UA: NEGATIVE mg/dL
Hgb urine dipstick: NEGATIVE
KETONES UR: NEGATIVE mg/dL
LEUKOCYTES UA: NEGATIVE
NITRITE: NEGATIVE
PH: 6 (ref 5.0–8.0)
Protein, ur: NEGATIVE mg/dL
SPECIFIC GRAVITY, URINE: 1.01 (ref 1.005–1.030)
Urobilinogen, UA: 0.2 mg/dL (ref 0.0–1.0)

## 2014-02-25 LAB — PROTEIN / CREATININE RATIO, URINE
Creatinine, Urine: 79.27 mg/dL
Protein Creatinine Ratio: 0.11 (ref 0.00–0.15)
Total Protein, Urine: 8.4 mg/dL

## 2014-02-25 NOTE — Discharge Instructions (Signed)
Third Trimester of Pregnancy  The third trimester is from week 29 through week 42, months 7 through 9. The third trimester is a time when the fetus is growing rapidly. At the end of the ninth month, the fetus is about 20 inches in length and weighs 6 10 pounds.   BODY CHANGES  Your body goes through many changes during pregnancy. The changes vary from woman to woman.    Your weight will continue to increase. You can expect to gain 25 35 pounds (11 16 kg) by the end of the pregnancy.   You may begin to get stretch marks on your hips, abdomen, and breasts.   You may urinate more often because the fetus is moving lower into your pelvis and pressing on your bladder.   You may develop or continue to have heartburn as a result of your pregnancy.   You may develop constipation because certain hormones are causing the muscles that push waste through your intestines to slow down.   You may develop hemorrhoids or swollen, bulging veins (varicose veins).   You may have pelvic pain because of the weight gain and pregnancy hormones relaxing your joints between the bones in your pelvis. Back aches may result from over exertion of the muscles supporting your posture.   Your breasts will continue to grow and be tender. A yellow discharge may leak from your breasts called colostrum.   Your belly button may stick out.   You may feel short of breath because of your expanding uterus.   You may notice the fetus "dropping," or moving lower in your abdomen.   You may have a bloody mucus discharge. This usually occurs a few days to a week before labor begins.   Your cervix becomes thin and soft (effaced) near your due date.  WHAT TO EXPECT AT YOUR PRENATAL EXAMS   You will have prenatal exams every 2 weeks until week 36. Then, you will have weekly prenatal exams. During a routine prenatal visit:   You will be weighed to make sure you and the fetus are growing normally.   Your blood pressure is taken.   Your abdomen will be  measured to track your baby's growth.   The fetal heartbeat will be listened to.   Any test results from the previous visit will be discussed.   You may have a cervical check near your due date to see if you have effaced.  At around 36 weeks, your caregiver will check your cervix. At the same time, your caregiver will also perform a test on the secretions of the vaginal tissue. This test is to determine if a type of bacteria, Group B streptococcus, is present. Your caregiver will explain this further.  Your caregiver may ask you:   What your birth plan is.   How you are feeling.   If you are feeling the baby move.   If you have had any abnormal symptoms, such as leaking fluid, bleeding, severe headaches, or abdominal cramping.   If you have any questions.  Other tests or screenings that may be performed during your third trimester include:   Blood tests that check for low iron levels (anemia).   Fetal testing to check the health, activity level, and growth of the fetus. Testing is done if you have certain medical conditions or if there are problems during the pregnancy.  FALSE LABOR  You may feel small, irregular contractions that eventually go away. These are called Braxton Hicks contractions, or   false labor. Contractions may last for hours, days, or even weeks before true labor sets in. If contractions come at regular intervals, intensify, or become painful, it is best to be seen by your caregiver.   SIGNS OF LABOR    Menstrual-like cramps.   Contractions that are 5 minutes apart or less.   Contractions that start on the top of the uterus and spread down to the lower abdomen and back.   A sense of increased pelvic pressure or back pain.   A watery or bloody mucus discharge that comes from the vagina.  If you have any of these signs before the 37th week of pregnancy, call your caregiver right away. You need to go to the hospital to get checked immediately.  HOME CARE INSTRUCTIONS    Avoid all  smoking, herbs, alcohol, and unprescribed drugs. These chemicals affect the formation and growth of the baby.   Follow your caregiver's instructions regarding medicine use. There are medicines that are either safe or unsafe to take during pregnancy.   Exercise only as directed by your caregiver. Experiencing uterine cramps is a good sign to stop exercising.   Continue to eat regular, healthy meals.   Wear a good support bra for breast tenderness.   Do not use hot tubs, steam rooms, or saunas.   Wear your seat belt at all times when driving.   Avoid raw meat, uncooked cheese, cat litter boxes, and soil used by cats. These carry germs that can cause birth defects in the baby.   Take your prenatal vitamins.   Try taking a stool softener (if your caregiver approves) if you develop constipation. Eat more high-fiber foods, such as fresh vegetables or fruit and whole grains. Drink plenty of fluids to keep your urine clear or pale yellow.   Take warm sitz baths to soothe any pain or discomfort caused by hemorrhoids. Use hemorrhoid cream if your caregiver approves.   If you develop varicose veins, wear support hose. Elevate your feet for 15 minutes, 3 4 times a day. Limit salt in your diet.   Avoid heavy lifting, wear low heal shoes, and practice good posture.   Rest a lot with your legs elevated if you have leg cramps or low back pain.   Visit your dentist if you have not gone during your pregnancy. Use a soft toothbrush to brush your teeth and be gentle when you floss.   A sexual relationship may be continued unless your caregiver directs you otherwise.   Do not travel far distances unless it is absolutely necessary and only with the approval of your caregiver.   Take prenatal classes to understand, practice, and ask questions about the labor and delivery.   Make a trial run to the hospital.   Pack your hospital bag.   Prepare the baby's nursery.   Continue to go to all your prenatal visits as directed  by your caregiver.  SEEK MEDICAL CARE IF:   You are unsure if you are in labor or if your water has broken.   You have dizziness.   You have mild pelvic cramps, pelvic pressure, or nagging pain in your abdominal area.   You have persistent nausea, vomiting, or diarrhea.   You have a bad smelling vaginal discharge.   You have pain with urination.  SEEK IMMEDIATE MEDICAL CARE IF:    You have a fever.   You are leaking fluid from your vagina.   You have spotting or bleeding from your vagina.     You have severe abdominal cramping or pain.   You have rapid weight loss or gain.   You have shortness of breath with chest pain.   You notice sudden or extreme swelling of your face, hands, ankles, feet, or legs.   You have not felt your baby move in over an hour.   You have severe headaches that do not go away with medicine.   You have vision changes.  Document Released: 10/27/2001 Document Revised: 07/05/2013 Document Reviewed: 01/03/2013  [employer] Patient Information 2014 [employer].

## 2014-02-25 NOTE — MAU Note (Signed)
Pt presents with an increase in swelling to her hands, feet and ankles. She is being monitored for pre-eclampsia and was told to come in if she had any symptoms of swelling.

## 2014-02-25 NOTE — H&P (Signed)
Kellie Deleon is a 23 y.o. female presenting for swelling for several days that is getting progressively worse. Maternal Medical History:  Reason for admission: Presents woth progressive swelling for several days.  Fetal activity: Perceived fetal activity is normal.   Last perceived fetal movement was within the past hour.    Prenatal complications: no prenatal complications   OB History   Grav Para Term Preterm Abortions TAB SAB Ect Mult Living   1 0 0 0 0 0 0 0 0 0      Past Medical History  Diagnosis Date  . No significant past medical history   . Loss of teeth due to extraction   . PCOS (polycystic ovarian syndrome)   . Medical history non-contributory   . Anxiety   . Headache(784.0)   . Bronchitis    Past Surgical History  Procedure Laterality Date  . Mouth surgery     Family History: family history includes Cancer in her other; Hypertension in her brother. Social History:  reports that she quit smoking about 15 months ago. She does not have any smokeless tobacco history on file. She reports that she does not drink alcohol or use illicit drugs.   Prenatal Transfer Tool  Maternal Diabetes: No Genetic Screening: Normal Maternal Ultrasounds/Referrals: Normal Fetal Ultrasounds or other Referrals:  None Maternal Substance Abuse:  No Significant Maternal Medications:  None Significant Maternal Lab Results:  None Other Comments:  None  Review of Systems  Constitutional: Negative.        Swelling  HENT: Negative.   Eyes: Negative.   Respiratory: Negative.   Cardiovascular: Negative.   Gastrointestinal: Negative.   Genitourinary: Negative.   Musculoskeletal: Negative.   Skin: Negative.   Endo/Heme/Allergies: Negative.   Psychiatric/Behavioral: Negative.       Blood pressure 166/90, pulse 77, temperature 98.5 F (36.9 C), resp. rate 18, height 5\' 5"  (1.651 m), weight 239 lb (108.41 kg), last menstrual period 06/14/2013. Maternal Exam:  Uterine Assessment:  No contractions abd gravid and non tender.  Abdomen: Patient reports no abdominal tenderness.   Fetal Exam Fetal Monitor Review: Mode: ultrasound.   Variability: moderate (6-25 bpm).   Pattern: accelerations present.    Fetal State Assessment: Category I - tracings are normal.     Physical Exam  Constitutional: She is oriented to person, place, and time. She appears well-developed and well-nourished.  HENT:  Head: Normocephalic.  Eyes: Pupils are equal, round, and reactive to light.  Neck: Normal range of motion.  Cardiovascular: Normal rate, regular rhythm, normal heart sounds and intact distal pulses.   Respiratory: Effort normal and breath sounds normal.  GI: Soft. Bowel sounds are normal.  Genitourinary: Vagina normal and uterus normal.  Musculoskeletal: Normal range of motion.  Neurological: She is alert and oriented to person, place, and time. She has normal reflexes.  No clonus  Skin: Skin is warm and dry.  Psychiatric: She has a normal mood and affect. Her behavior is normal. Judgment and thought content normal.    Prenatal labs: ABO, Rh: O/POS/-- (10/17 1106) Antibody: NEG (10/17 1106) Rubella: 7.91 (10/17 1106) RPR: NON REAC (02/25 0932)  HBsAg: NEGATIVE (10/17 1106)  HIV: NON REACTIVE (10/17 1106)  GBS:     Assessment/Plan: Significant edema face, hands, and extrenities. Denies headache, dizziness, blurred vision or dizziness.   Kellie Deleon 02/25/2014, 7:12 PM

## 2014-02-25 NOTE — MAU Provider Note (Signed)
S: denies complaints O: VSS, Labs stable, FHR pattern stable A: stable materanl fetal unit P: consulted Dr. Debroah LoopArnold pt to be discharged and f/u up in clinic on Tuesday.

## 2014-02-27 ENCOUNTER — Encounter: Payer: Self-pay | Admitting: Obstetrics & Gynecology

## 2014-02-27 ENCOUNTER — Ambulatory Visit (INDEPENDENT_AMBULATORY_CARE_PROVIDER_SITE_OTHER): Payer: Medicaid Other | Admitting: Obstetrics & Gynecology

## 2014-02-27 VITALS — BP 136/85 | Temp 98.0°F | Wt 238.9 lb

## 2014-02-27 DIAGNOSIS — O99340 Other mental disorders complicating pregnancy, unspecified trimester: Secondary | ICD-10-CM

## 2014-02-27 DIAGNOSIS — Z3400 Encounter for supervision of normal first pregnancy, unspecified trimester: Secondary | ICD-10-CM

## 2014-02-27 DIAGNOSIS — O26839 Pregnancy related renal disease, unspecified trimester: Secondary | ICD-10-CM

## 2014-02-27 DIAGNOSIS — O1213 Gestational proteinuria, third trimester: Secondary | ICD-10-CM

## 2014-02-27 DIAGNOSIS — R809 Proteinuria, unspecified: Secondary | ICD-10-CM

## 2014-02-27 DIAGNOSIS — Z3401 Encounter for supervision of normal first pregnancy, first trimester: Secondary | ICD-10-CM

## 2014-02-27 LAB — POCT URINALYSIS DIP (DEVICE)
Bilirubin Urine: NEGATIVE
Glucose, UA: NEGATIVE mg/dL
Hgb urine dipstick: NEGATIVE
KETONES UR: NEGATIVE mg/dL
Nitrite: NEGATIVE
PH: 7 (ref 5.0–8.0)
Protein, ur: NEGATIVE mg/dL
SPECIFIC GRAVITY, URINE: 1.015 (ref 1.005–1.030)
Urobilinogen, UA: 0.2 mg/dL (ref 0.0–1.0)

## 2014-02-27 NOTE — Progress Notes (Signed)
Continue to monitor BP, BP was 166/90 over the weekend and she was observed for a few hours then sent home.  BP today is 136/85.  Does not meet criteria for GHTN yet, but close. Implications of GHTN discussed with patient, possible need for early delivery etc.  Preeclampsia precautions reviewed.  Fetal movement and labor precautions reviewed.  Pelvic cultures to be done next week.  Work restrictions letter given to patient.

## 2014-02-27 NOTE — Progress Notes (Signed)
P= 80 Edema in feet. Pt. Seen in MAU over the weekend for swelling and elevated BP. BP slightly elevated today. Denies HA, SOB, dizziness.  C/o of lower abdominal/pelvic pressure especially at night.

## 2014-02-27 NOTE — Patient Instructions (Addendum)
Return to clinic for any obstetric concerns or go to MAU for evaluation Hypertension During Pregnancy Hypertension is also called high blood pressure. It can occur at any time in life and during pregnancy. When you have hypertension, there is extra pressure inside your blood vessels that carry blood from the heart to the rest of your body (arteries). Hypertension during pregnancy can cause problems for you and your baby. Your baby might not weigh as much as it should at birth or might be born early (premature). Very bad cases of hypertension during pregnancy can be life threatening.  Different types of hypertension can occur during pregnancy.   Chronic hypertension. This happens when a woman has hypertension before pregnancy and it continues during pregnancy.  Gestational hypertension. This is when hypertension develops during pregnancy.  Preeclampsia or toxemia of pregnancy. This is a very serious type of hypertension that develops only during pregnancy. It is a disease that affects the whole body (systemic) and can be very dangerous for both mother and baby.  Gestational hypertension and preeclampsia usually go away after your baby is born. Blood pressure generally stabilizes within 6 weeks. Women who have hypertension during pregnancy have a greater chance of developing hypertension later in life or with future pregnancies. RISK FACTORS Some factors make you more likely to develop hypertension during pregnancy. Risk factors include:  Having hypertension before pregnancy.  Having hypertension during a previous pregnancy.  Being overweight.  Being older than 40.  Being pregnant with more than one baby (multiples).  Having diabetes or kidney problems. SIGNS AND SYMPTOMS Chronic and gestational hypertension rarely cause symptoms. Preeclampsia has symptoms, which may include:  Increased protein in your urine. Your health care provider will check for this at every prenatal  visit.  Swelling of your hands and face.  Rapid weight gain.  Headaches.  Visual changes.  Being bothered by light.  Abdominal pain, especially in the right upper area.  Chest pain.  Shortness of breath.  Increased reflexes.  Seizures. Seizures occur with a more severe form of preeclampsia, called eclampsia. DIAGNOSIS   You may be diagnosed with hypertension during a regular prenatal exam. At each visit, tests may include:  Blood pressure checks.  A urine test to check for protein in your urine.  The type of hypertension you are diagnosed with depends on when you developed it. It also depends on your specific blood pressure reading.  Developing hypertension before 20 weeks of pregnancy is consistent with chronic hypertension.  Developing hypertension after 20 weeks of pregnancy is consistent with gestational hypertension.  Hypertension with increased urinary protein is diagnosed as preeclampsia.  Blood pressure measurements that stay above 160 systolic or 110 diastolic are a sign of severe preeclampsia. TREATMENT Treatment for hypertension during pregnancy varies. Treatment depends on the type of hypertension and how serious it is.  If you take medicine for chronic hypertension, you may need to switch medicines.  Drugs called ACE inhibitors should not be taken during pregnancy.  Low-dose aspirin may be suggested for women who have risk factors for preeclampsia.  If you have gestational hypertension, you may need to take a blood pressure medicine that is safe during pregnancy. Your health care provider will recommend the appropriate medicine.  If you have severe preeclampsia, you may need to be in the hospital. Health care providers will watch you and the baby very closely. You also may need to take medicine (magnesium sulfate) to prevent seizures and lower blood pressure.  Sometimes an early delivery   is needed. This may be the case if the condition worsens. It would  be done to protect you and the baby. The only cure for preeclampsia is delivery. HOME CARE INSTRUCTIONS  Schedule and keep all of your regular appointments for prenatal care.  Only take over-the-counter or prescription medicines as directed by your health care provider. Tell your health care provider about all medicines you take.  Eat as little salt as possible.  Get regular exercise.  Do not drink alcohol.  Do not use tobacco products.  Do not drink products with caffeine.  Lie on your left side when resting. SEEK IMMEDIATE MEDICAL CARE IF:  You have severe abdominal pain.  You have sudden swelling in the hands, ankles, or face.  You gain 4 pounds (1.8 kg) or more in 1 week.  You vomit repeatedly.  You have vaginal bleeding.  You do not feel the baby moving as much.  You have a headache.  You have blurred or double vision.  You have muscle twitching or spasms.  You have shortness of breath.  You have blue fingernails and lips.  You have blood in your urine. MAKE SURE YOU:  Understand these instructions.  Will watch your condition.  Will get help right away if you are not doing well or get worse. Document Released: 07/21/2011 Document Revised: 08/23/2013 Document Reviewed: 06/01/2013 [employer] Patient Information 2014 [employer].  

## 2014-03-08 ENCOUNTER — Encounter (HOSPITAL_COMMUNITY): Payer: Self-pay | Admitting: *Deleted

## 2014-03-08 ENCOUNTER — Inpatient Hospital Stay (HOSPITAL_COMMUNITY)
Admission: AD | Admit: 2014-03-08 | Discharge: 2014-03-08 | Disposition: A | Discharge status: Home / Self Care | Payer: Medicaid Other | Source: Ambulatory Visit | Attending: Obstetrics and Gynecology | Admitting: Obstetrics and Gynecology

## 2014-03-08 DIAGNOSIS — R030 Elevated blood-pressure reading, without diagnosis of hypertension: Secondary | ICD-10-CM | POA: Insufficient documentation

## 2014-03-08 DIAGNOSIS — N898 Other specified noninflammatory disorders of vagina: Secondary | ICD-10-CM

## 2014-03-08 DIAGNOSIS — O1213 Gestational proteinuria, third trimester: Secondary | ICD-10-CM

## 2014-03-08 DIAGNOSIS — O219 Vomiting of pregnancy, unspecified: Secondary | ICD-10-CM

## 2014-03-08 DIAGNOSIS — O99891 Other specified diseases and conditions complicating pregnancy: Secondary | ICD-10-CM | POA: Insufficient documentation

## 2014-03-08 DIAGNOSIS — N939 Abnormal uterine and vaginal bleeding, unspecified: Secondary | ICD-10-CM

## 2014-03-08 DIAGNOSIS — Z3401 Encounter for supervision of normal first pregnancy, first trimester: Secondary | ICD-10-CM

## 2014-03-08 DIAGNOSIS — O4690 Antepartum hemorrhage, unspecified, unspecified trimester: Secondary | ICD-10-CM | POA: Insufficient documentation

## 2014-03-08 DIAGNOSIS — O99340 Other mental disorders complicating pregnancy, unspecified trimester: Secondary | ICD-10-CM

## 2014-03-08 DIAGNOSIS — O4440 Low lying placenta NOS or without hemorrhage, unspecified trimester: Secondary | ICD-10-CM

## 2014-03-08 DIAGNOSIS — N926 Irregular menstruation, unspecified: Secondary | ICD-10-CM

## 2014-03-08 DIAGNOSIS — O9989 Other specified diseases and conditions complicating pregnancy, childbirth and the puerperium: Secondary | ICD-10-CM

## 2014-03-08 DIAGNOSIS — Z87891 Personal history of nicotine dependence: Secondary | ICD-10-CM | POA: Insufficient documentation

## 2014-03-08 NOTE — MAU Note (Signed)
Pt G1 at 36.6wks with bloody show tonight.  Elevated BP with pregnancy.

## 2014-03-08 NOTE — MAU Note (Signed)
Pt states she is having bloody discharge pt states she is having pain that comes and goes as well

## 2014-03-08 NOTE — Discharge Instructions (Signed)
Vaginal Bleeding During Pregnancy, Third Trimester A small amount of bleeding (spotting) from the vagina is relatively common in pregnancy. Various things can cause bleeding or spotting in pregnancy. Sometimes the bleeding is normal and is not a problem. However, bleeding during the third trimester can also be a sign of something serious for the mother and the baby. Be sure to tell your health care provider about any vaginal bleeding right away.  Some possible causes of vaginal bleeding during the third trimester include:   The placenta may be partially or completely covering the opening to the cervix (placenta previa).   The placenta may have separated from the uterus (abruption of the placenta).   There may be an infection or growth on the cervix.   You may be starting labor, called discharging of the mucus plug.   The placenta may grow into the muscle layer of the uterus (placenta accreta).  HOME CARE INSTRUCTIONS  Watch your condition for any changes. The following actions may help to lessen any discomfort you are feeling:   Follow your health care provider's instructions for limiting your activity. If your health care provider orders bed rest, you may need to stay in bed and only get up to use the bathroom. However, your health care provider may allow you to continue light activity.  If needed, make plans for someone to help with your regular activities and responsibilities while you are on bed rest.  Keep track of the number of pads you use each day, how often you change pads, and how soaked (saturated) they are. Write this down.  Do not use tampons. Do not douche.  Do not have sexual intercourse or orgasms until approved by your health care provider.  Follow your health care provider's advice about lifting, driving, and physical activities.  If you pass any tissue from your vagina, save the tissue so you can show it to your health care provider.   Only take over-the-counter  or prescription medicines as directed by your health care provider.  Do not take aspirin because it can make you bleed.   Keep all follow-up appointments as directed by your health care provider. SEEK MEDICAL CARE IF:  You have any vaginal bleeding during any part of your pregnancy.  You have cramps or labor pains. SEEK IMMEDIATE MEDICAL CARE IF:   You have severe cramps or pain in your back or belly (abdomen).  You have a fever, not controlled by medicine.  You have chills.  You have a gush of fluid from the vagina.  You pass large clots or tissue from your vagina.  Your bleeding increases.  You feel lightheaded or weak.  You pass out.  You feel less movement or no movement of the baby.  MAKE SURE YOU:  Understand these instructions.  Will watch your condition.  Will get help right away if you are not doing well or get worse. Document Released: 01/23/2003 Document Revised: 08/23/2013 Document Reviewed: 07/10/2013 ExitCare Patient Information 2014 [employer].  

## 2014-03-08 NOTE — MAU Provider Note (Signed)
Chief Complaint:  Vaginal Bleeding   Kellie Deleon is a 23 y.o.  G1P0000 with IUP at 1241w6d presenting for Vaginal Bleeding  States that when she went to the bathroom, she noticed some thicker mucous discharge with bloody streaks in it.  Says she wiped 3 times and she continued to have the discharge. Called the nurse line and was told to come in. No blood in underwear or on her pants.  +FM. No LOF, ctx.   Does have a hx of a low lying placenta but resolved at 24 weeks.  [facility] and has been complicated by some proteinuria and occasional isolated elevated BP.  Prot/cr ratio of 0.11 last week.    Menstrual History: OB History   Grav Para Term Preterm Abortions TAB SAB Ect Mult Living   1 0 0 0 0 0 0 0 0 0        Patient's last menstrual period was 06/14/2013.      Past Medical History  Diagnosis Date  . No significant past medical history   . Loss of teeth due to extraction   . PCOS (polycystic ovarian syndrome)   . Anxiety   . Headache(784.0)   . Bronchitis     Past Surgical History  Procedure Laterality Date  . Mouth surgery      Family History  Problem Relation Age of Onset  . Cancer Other   . Hypertension Brother     History  Substance Use Topics  . Smoking status: Former Smoker    Quit date: 10/27/2012  . Smokeless tobacco: Never Used  . Alcohol Use: No     Comment: Occassional Use      Allergies  Allergen Reactions  . Latex Rash and Other (See Comments)    Burning     Prescriptions prior to admission  Medication Sig Dispense Refill  . promethazine (PHENERGAN) 25 MG tablet Take 25 mg by mouth every 6 (six) hours as needed for nausea or vomiting.      Marland Kitchen. acetaminophen (TYLENOL) 500 MG tablet Take 1,000 mg by mouth every 6 (six) hours as needed for pain.      . Prenatal Vit-Fe Fumarate-FA (PRENATAL MULTIVITAMIN) TABS tablet Take 1 tablet by mouth daily at 12 noon.        Review of Systems - Negative except for what is mentioned in HPI.  Physical  Exam  Blood pressure 131/80, pulse 77, temperature 98.1 F (36.7 C), temperature source Oral, resp. rate 20, height 5\' 3"  (1.6 m), weight 110.406 kg (243 lb 6.4 oz), last menstrual period 06/14/2013. GENERAL: Well-developed, well-nourished female in no acute distress.  LUNGS: Clear to auscultation bilaterally.  HEART: Regular rate and rhythm. ABDOMEN: Soft, nontender, nondistended, gravid.  GU: vaginal vault clear. No bleeding. Cervix visually closed.  EXTREMITIES: Nontender, no edema, 2+ distal pulses. Cervical Exam: Dilatation 1cm   Effacement 50%   Station -3   Presentation: cephalic FHT:  Baseline rate 150 bpm   Variability moderate  Accelerations present   Decelerations none Contractions: rare   Labs: No results found for this or any previous visit (from the past 24 hour(s)).  Imaging Studies:  [place] Ob Follow Up  02/21/2014   OBSTETRICAL ULTRASOUND: This exam was performed within a [place] [facility]. The OB US report was generated in the AS system, and faxed to the ordering physician.   This report is also available in [facility]'s [employer] and in the [employer] PACS. See [facility] Obstetric US report.   Assessment:  Kellie Deleon is  23 y.o. G1P0000 at 2440w6d presents with Vaginal Bleeding .  Plan: - likely loss of mucous plug and cervix effacing.  - speculum exam negative for any blood - reassurance given - labor precautions discussed  Initial BP 146/88 but all others were 130s/80s which is her baseline.   F/u as scheduled on Friday in clinic.    Aakash Hollomon L Farran Amsden 4/23/20152:05 AM

## 2014-03-09 ENCOUNTER — Inpatient Hospital Stay (HOSPITAL_COMMUNITY)
Admission: AD | Admit: 2014-03-09 | Discharge: 2014-03-12 | DRG: 774 | Disposition: A | Discharge status: Home / Self Care | Payer: Medicaid Other | Source: Ambulatory Visit | Attending: Obstetrics & Gynecology | Admitting: Obstetrics & Gynecology

## 2014-03-09 ENCOUNTER — Ambulatory Visit (INDEPENDENT_AMBULATORY_CARE_PROVIDER_SITE_OTHER): Payer: Medicaid Other | Admitting: Advanced Practice Midwife

## 2014-03-09 VITALS — BP 150/99 | HR 79 | Temp 97.7°F | Wt 241.4 lb

## 2014-03-09 DIAGNOSIS — O1415 Severe pre-eclampsia, complicating the puerperium: Secondary | ICD-10-CM | POA: Diagnosis present

## 2014-03-09 DIAGNOSIS — Z8249 Family history of ischemic heart disease and other diseases of the circulatory system: Secondary | ICD-10-CM

## 2014-03-09 DIAGNOSIS — F411 Generalized anxiety disorder: Secondary | ICD-10-CM | POA: Diagnosis present

## 2014-03-09 DIAGNOSIS — IMO0002 Reserved for concepts with insufficient information to code with codable children: Principal | ICD-10-CM | POA: Diagnosis present

## 2014-03-09 DIAGNOSIS — O99892 Other specified diseases and conditions complicating childbirth: Secondary | ICD-10-CM | POA: Diagnosis present

## 2014-03-09 DIAGNOSIS — O1490 Unspecified pre-eclampsia, unspecified trimester: Secondary | ICD-10-CM

## 2014-03-09 DIAGNOSIS — O4440 Low lying placenta NOS or without hemorrhage, unspecified trimester: Secondary | ICD-10-CM

## 2014-03-09 DIAGNOSIS — Z6841 Body Mass Index (BMI) 40.0 and over, adult: Secondary | ICD-10-CM

## 2014-03-09 DIAGNOSIS — O99344 Other mental disorders complicating childbirth: Secondary | ICD-10-CM | POA: Diagnosis present

## 2014-03-09 DIAGNOSIS — O99214 Obesity complicating childbirth: Secondary | ICD-10-CM

## 2014-03-09 DIAGNOSIS — O99340 Other mental disorders complicating pregnancy, unspecified trimester: Secondary | ICD-10-CM

## 2014-03-09 DIAGNOSIS — Z22330 Carrier of Group B streptococcus: Secondary | ICD-10-CM

## 2014-03-09 DIAGNOSIS — E669 Obesity, unspecified: Secondary | ICD-10-CM | POA: Diagnosis present

## 2014-03-09 DIAGNOSIS — O1213 Gestational proteinuria, third trimester: Secondary | ICD-10-CM

## 2014-03-09 DIAGNOSIS — O9989 Other specified diseases and conditions complicating pregnancy, childbirth and the puerperium: Secondary | ICD-10-CM

## 2014-03-09 DIAGNOSIS — Z87891 Personal history of nicotine dependence: Secondary | ICD-10-CM

## 2014-03-09 LAB — COMPREHENSIVE METABOLIC PANEL
ALBUMIN: 2.9 g/dL — AB (ref 3.5–5.2)
ALT: 11 U/L (ref 0–35)
AST: 15 U/L (ref 0–37)
Alkaline Phosphatase: 177 U/L — ABNORMAL HIGH (ref 39–117)
BILIRUBIN TOTAL: 0.3 mg/dL (ref 0.3–1.2)
BUN: 4 mg/dL — AB (ref 6–23)
CHLORIDE: 106 meq/L (ref 96–112)
CO2: 24 mEq/L (ref 19–32)
Calcium: 9.4 mg/dL (ref 8.4–10.5)
Creatinine, Ser: 0.62 mg/dL (ref 0.50–1.10)
GFR calc non Af Amer: >90 mL/min (ref >90)
GLUCOSE: 75 mg/dL (ref 70–99)
Potassium: 4 mEq/L (ref 3.7–5.3)
Sodium: 143 mEq/L (ref 137–147)
Total Protein: 6.1 g/dL (ref 6.0–8.3)

## 2014-03-09 LAB — CBC
HCT: 37.7 % (ref 36.0–46.0)
Hemoglobin: 12.8 g/dL (ref 12.0–15.0)
MCH: 31.6 pg (ref 26.0–34.0)
MCHC: 34 g/dL (ref 30.0–36.0)
MCV: 93.1 fL (ref 78.0–100.0)
Platelets: 211 10*3/uL (ref 150–400)
RBC: 4.05 MIL/uL (ref 3.87–5.11)
RDW: 12.9 % (ref 11.5–15.5)
WBC: 11.6 10*3/uL — ABNORMAL HIGH (ref 4.0–10.5)

## 2014-03-09 LAB — TYPE AND SCREEN
ABO/RH(D): O POS
Antibody Screen: NEGATIVE

## 2014-03-09 LAB — OB RESULTS CONSOLE GBS: GBS: POSITIVE

## 2014-03-09 LAB — POCT URINALYSIS DIP (DEVICE)
Bilirubin Urine: NEGATIVE
Glucose, UA: NEGATIVE mg/dL
Hgb urine dipstick: NEGATIVE
Ketones, ur: NEGATIVE mg/dL
Leukocytes, UA: NEGATIVE
Nitrite: NEGATIVE
Protein, ur: NEGATIVE mg/dL
Specific Gravity, Urine: 1.015 (ref 1.005–1.030)
Urobilinogen, UA: 0.2 mg/dL (ref 0.0–1.0)
pH: 7 (ref 5.0–8.0)

## 2014-03-09 LAB — RPR

## 2014-03-09 LAB — PROTEIN / CREATININE RATIO, URINE
CREATININE, URINE: 97.96 mg/dL
Protein Creatinine Ratio: 0.15 (ref 0.00–0.15)
TOTAL PROTEIN, URINE: 14.9 mg/dL

## 2014-03-09 LAB — GROUP B STREP BY PCR: Group B strep by PCR: POSITIVE — AB

## 2014-03-09 MED ORDER — PENICILLIN G POTASSIUM 5000000 UNITS IJ SOLR
5.0000 10*6.[IU] | Freq: Once | INTRAMUSCULAR | Status: AC
  Administered 2014-03-09: 5 10*6.[IU] via INTRAVENOUS
  Filled 2014-03-09: qty 5

## 2014-03-09 MED ORDER — IBUPROFEN 600 MG PO TABS
600.0000 mg | ORAL_TABLET | Freq: Four times a day (QID) | ORAL | Status: DC | PRN
Start: 2014-03-09 — End: 2014-03-10

## 2014-03-09 MED ORDER — ACETAMINOPHEN 325 MG PO TABS
650.0000 mg | ORAL_TABLET | ORAL | Status: DC | PRN
Start: 2014-03-09 — End: 2014-03-10

## 2014-03-09 MED ORDER — LACTATED RINGERS IV SOLN
INTRAVENOUS | Status: DC
Start: 2014-03-09 — End: 2014-03-10
  Administered 2014-03-09 – 2014-03-10 (×3): via INTRAVENOUS

## 2014-03-09 MED ORDER — LIDOCAINE HCL (PF) 1 % IJ SOLN
30.0000 mL | INTRAMUSCULAR | Status: DC | PRN
  Filled 2014-03-09: qty 30

## 2014-03-09 MED ORDER — DEXTROSE 5 % IV SOLN
2.5000 10*6.[IU] | INTRAVENOUS | Status: DC
  Administered 2014-03-09 – 2014-03-10 (×5): 2.5 10*6.[IU] via INTRAVENOUS
  Filled 2014-03-09 (×9): qty 2.5

## 2014-03-09 MED ORDER — PENICILLIN G POTASSIUM 5000000 UNITS IJ SOLR: 5.0000 10*6.[IU] | Freq: Once | INTRAMUSCULAR | Status: DC

## 2014-03-09 MED ORDER — ONDANSETRON HCL 4 MG/2ML IJ SOLN
4.0000 mg | Freq: Four times a day (QID) | INTRAMUSCULAR | Status: DC | PRN
  Filled 2014-03-09: qty 2

## 2014-03-09 MED ORDER — TERBUTALINE SULFATE 1 MG/ML IJ SOLN: 0.2500 mg | Freq: Once | INTRAMUSCULAR | Status: AC | PRN

## 2014-03-09 MED ORDER — OXYTOCIN BOLUS FROM INFUSION: 500.0000 mL | INTRAVENOUS | Status: DC

## 2014-03-09 MED ORDER — LACTATED RINGERS IV SOLN: 500.0000 mL | INTRAVENOUS | Status: DC | PRN

## 2014-03-09 MED ORDER — PENICILLIN G POTASSIUM 5000000 UNITS IJ SOLR: 2.5000 10*6.[IU] | INTRAMUSCULAR | Status: DC

## 2014-03-09 MED ORDER — OXYCODONE-ACETAMINOPHEN 5-325 MG PO TABS: 1.0000 | ORAL_TABLET | ORAL | Status: DC | PRN

## 2014-03-09 MED ORDER — TERBUTALINE SULFATE 1 MG/ML IJ SOLN
0.2500 mg | Freq: Once | INTRAMUSCULAR | Status: AC | PRN
Start: 2014-03-09 — End: 2014-03-09

## 2014-03-09 MED ORDER — OXYTOCIN 40 UNITS IN LACTATED RINGERS INFUSION - SIMPLE MED
62.5000 mL/h | INTRAVENOUS | Status: DC
  Filled 2014-03-09: qty 1000

## 2014-03-09 MED ORDER — MISOPROSTOL 25 MCG QUARTER TABLET
25.0000 ug | ORAL_TABLET | ORAL | Status: DC | PRN
Start: 2014-03-09 — End: 2014-03-10
  Administered 2014-03-09: 25 ug via VAGINAL
  Filled 2014-03-09: qty 0.25
  Filled 2014-03-09: qty 1

## 2014-03-09 MED ORDER — CITRIC ACID-SODIUM CITRATE 334-500 MG/5ML PO SOLN
30.0000 mL | ORAL | Status: DC | PRN
Start: 2014-03-09 — End: 2014-03-10

## 2014-03-09 NOTE — H&P (Signed)
Attestation of Attending Supervision of Obstetric Fellow: Evaluation and management procedures were performed by the Obstetric Fellow under my supervision and collaboration.  I have reviewed the Obstetric Fellow's note and chart, and I agree with the management and plan.  Kayd Launer, MD, FACOG Attending Obstetrician & Gynecologist Faculty Practice, [facility]   

## 2014-03-09 NOTE — Progress Notes (Signed)
C/O headache and seeing spots.  Urine pending today.  Facial edema. Cultures done.  GBS changed to rapid  Cervix 1/50/-3. Discussed with Dr Macon LargeAnyanwu. Needs to be induced today. Dr Ike Benedom notified

## 2014-03-09 NOTE — Progress Notes (Addendum)
Kellie Deleon is a 23 y.o. G1P0000 at 5471w0d by LMP admitted for induction of labor due to Pre-eclamptic toxemia of pregnancy..  Subjective: Pt comfortable with FB in place. Feeling cramps  Objective: BP 129/78  Pulse 78  Temp(Src) 98 F (36.7 C) (Oral)  Resp 18  Ht 5\' 3"  (1.6 m)  Wt 109.77 kg (242 lb)  BMI 42.88 kg/m2  LMP 06/14/2013     Filed Vitals:   03/09/14 1033 03/09/14 1130 03/09/14 1213  BP: 145/87 135/80 129/78  Pulse: 84 89 78  Temp: 98 F (36.7 C)    TempSrc: Oral    Resp: 18 18 18   Height: 5\' 3"  (1.6 m)    Weight: 109.77 kg (242 lb)       FHT:  FHR: 120s bpm, variability: moderate,  accelerations:  Present,  decelerations:  Absent UC:   none SVE:   Dilation: 1 Effacement (%): 50 Station: -3 Exam by:: Dr Ike Benedom  Labs: Lab Results  Component Value Date   WBC 11.6* 03/09/2014   HGB 12.8 03/09/2014   HCT 37.7 03/09/2014   MCV 93.1 03/09/2014   PLT 211 03/09/2014   CMP     Component Value Date/Time   NA 143 03/09/2014 1110   K 4.0 03/09/2014 1110   CL 106 03/09/2014 1110   CO2 24 03/09/2014 1110   GLUCOSE 75 03/09/2014 1110   BUN 4* 03/09/2014 1110   CREATININE 0.62 03/09/2014 1110   CREATININE 0.53 02/12/2014 1240   CREATININE 0.53 02/12/2014 1233   CALCIUM 9.4 03/09/2014 1110   PROT 6.1 03/09/2014 1110   ALBUMIN 2.9* 03/09/2014 1110   AST 15 03/09/2014 1110   ALT 11 03/09/2014 1110   ALKPHOS 177* 03/09/2014 1110   BILITOT 0.3 03/09/2014 1110   GFRNONAA >90 03/09/2014 1110   GFRAA >90 03/09/2014 1110   Normal Pr:Cr ratio   Assessment / Plan: Induction of labor due to preeclampsia,  progressing well on pitocin  Labor: Progressing normally and FB in place Preeclampsia:  no signs or symptoms of toxicity Fetal Wellbeing:  Category I Pain Control:  Labor support without medications I/D:  GBS pos will tx with abx Anticipated MOD:  NSVD  Minta BalsamMichael R Jaymeson Mengel 03/09/2014, 2:48 PM

## 2014-03-09 NOTE — Progress Notes (Signed)
Edema-feet/hands  Pain/pressure-lower abd  Vaginal discharge-pink-tinged Pt reports going to MAU on Wednesday for blood in discharge and BP was elevated

## 2014-03-09 NOTE — H&P (Signed)
Kellie Deleon is a 23 y.o. female G1P0000 with IUP at 182w0d presenting for IOL for preeclampsia. Elevated blood pressure in clinic. Hx of elevated protein in urine previously. Pt admitted and will start induction. Pt has been having daily headaches (mild now) occasionally with spot. Reports 10lb weight gain over last week and states having some difuse edema. No RUQ pain  Normal fetal movement, no LOF, no VB, minimal rare ctx  [facility]  Prenatal History/Complications:  Uncomplicated prenatal course  Past Medical History: Past Medical History  Diagnosis Date  . No significant past medical history   . Loss of teeth due to extraction   . PCOS (polycystic ovarian syndrome)   . Anxiety   . Headache(784.0)   . Bronchitis     Past Surgical History: Past Surgical History  Procedure Laterality Date  . Mouth surgery      Obstetrical History: OB History   Grav Para Term Preterm Abortions TAB SAB Ect Mult Living   1 0 0 0 0 0 0 0 0 0       Gynecological History: OB History   Grav Para Term Preterm Abortions TAB SAB Ect Mult Living   1 0 0 0 0 0 0 0 0 0       Social History: History   Social History  . Marital Status: Married    Spouse Name: N/A    Number of Children: N/A  . Years of Education: N/A   Social History Main Topics  . Smoking status: Former Smoker    Quit date: 10/27/2012  . Smokeless tobacco: Never Used  . Alcohol Use: No     Comment: Occassional Use  . Drug Use: No     Comment: Marijuana use in 2013  . Sexual Activity: Yes    Birth Control/ Protection: None   Other Topics Concern  . Not on file   Social History Narrative  . No narrative on file    Family History: Family History  Problem Relation Age of Onset  . Cancer Other   . Hypertension Brother     Allergies: Allergies  Allergen Reactions  . Latex Rash and Other (See Comments)    Burning     Prescriptions prior to admission  Medication Sig Dispense Refill  .  acetaminophen (TYLENOL) 500 MG tablet Take 1,000 mg by mouth every 6 (six) hours as needed for pain.      . Prenatal Vit-Fe Fumarate-FA (PRENATAL MULTIVITAMIN) TABS tablet Take 1 tablet by mouth daily at 12 noon.      . promethazine (PHENERGAN) 25 MG tablet Take 25 mg by mouth every 6 (six) hours as needed for nausea or vomiting.         Review of Systems   Constitutional: no other complaints at this tiem Filed Vitals:   03/09/14 1033  BP: 145/87  Pulse: 84  Temp: 98 F (36.7 C)  TempSrc: Oral  Resp: 18  Height: 5\' 3"  (1.6 m)  Weight: 109.77 kg (242 lb)    Blood pressure 145/87, pulse 84, temperature 98 F (36.7 C), temperature source Oral, resp. rate 18, height 5\' 3"  (1.6 m), weight 109.77 kg (242 lb), last menstrual period 06/14/2013. General appearance: alert, cooperative, appears stated age and no distress Lungs: clear to auscultation bilaterally Heart: regular rate and rhythm Abdomen: soft, non-tender; bowel sounds normal Pelvic: adequate Extremities: Homans sign is negative, no sign of DVT DTR's 1+ no clonus appreciated Presentation: cephalic Fetal monitoringBaseline: 130s bpm, Variability: Good {>  6 bpm), Accelerations: Reactive and Decelerations: Absent Uterine activityNone Dilation: 1 Effacement (%): 50 Station: -3 Exam by:: Dr Ike Benedom   Prenatal labs: ABO, Rh: O/POS/-- (10/17 1106) Antibody: NEG (10/17 1106) Rubella:   RPR: NON REAC (02/25 0932)  HBsAg: NEGATIVE (10/17 1106)  HIV: NON REACTIVE (10/17 1106)  GBS:      [facility]  Genetic Screen  Quad Neg  Anatomic US  Nml anatomy at 18 wks (EDC change);   GTT Early:               Third trimester: 137- normal 3 hr  TDaP vaccine  01/10/2014  Flu vaccine  Nov 12  GBS  Pending  Baby Food  Breast  Contraception    Circumcision  female  Pediatrician     Pap neg GC/CT neg x 2 07/2013    Prenatal Transfer Tool  Maternal Diabetes: No Genetic Screening: Normal Maternal Ultrasounds/Referrals:  Normal Fetal Ultrasounds or other Referrals:  None Maternal Substance Abuse:  No Significant Maternal Medications:  None Significant Maternal Lab Results: GBS Pending     Results for orders placed during the hospital encounter of 03/09/14 (from the past 24 hour(s))  POCT URINALYSIS DIP (DEVICE)   Collection Time    03/09/14 10:12 AM      Result Value Ref Range   Glucose, UA NEGATIVE  NEGATIVE mg/dL   Bilirubin Urine NEGATIVE  NEGATIVE   Ketones, ur NEGATIVE  NEGATIVE mg/dL   Specific Gravity, Urine 1.015  1.005 - 1.030   Hgb urine dipstick NEGATIVE  NEGATIVE   pH 7.0  5.0 - 8.0   Protein, ur NEGATIVE  NEGATIVE mg/dL   Urobilinogen, UA 0.2  0.0 - 1.0 mg/dL   Nitrite NEGATIVE  NEGATIVE   Leukocytes, UA NEGATIVE  NEGATIVE    Assessment: Kellie Deleon is a 23 y.o. G1P0000 at 6460w0d by R=5 here for IOL for preeclampsia #Labor: IOL FB placed without difficulty. If not out by 12 hrs, consider augmentation #Pain: Undecided at this time, will reevaluate PRN #FWB: Cat I #ID:  GBS pending, PCN as needed #MOF: Breast #PreEclampsia: elevated BPs, repeat labs pending. WIll admit and start IOL, montor BPs, start tx as needed for severe range, tx with mag based on lab results.  #light labor diet.  Minta BalsamMichael R Daxtin Leiker 03/09/2014, 11:18 AM

## 2014-03-09 NOTE — Progress Notes (Signed)
Lurena Joinerndreana Williams is a 23 y.o. G1P0000 at 5471w0d   Subjective: Foley bulb still in place; rec'd one dose of cytotec at approx 1800  Objective: BP 143/69  Pulse 68  Temp(Src) 98.6 F (37 C) (Oral)  Resp 20  Ht 5\' 3"  (1.6 m)  Wt 109.77 kg (242 lb)  BMI 42.88 kg/m2  LMP 06/14/2013      FHT:  FHR: 125-130 bpm, variability: moderate,  accelerations:  Present,  decelerations:  Absent UC:   irreg with some irritability SVE:   Dilation: 2 (around FB) Effacement (%): 50 Station: -3 Exam by:: Dr. Ike Benedom- not reexamined  Labs: Lab Results  Component Value Date   WBC 11.6* 03/09/2014   HGB 12.8 03/09/2014   HCT 37.7 03/09/2014   MCV 93.1 03/09/2014   PLT 211 03/09/2014    Assessment / Plan: IOL process- preeclampsia Unfavorable cx  Will continue with foley in place and begin Pitocin when it comes out   Arabella MerlesKimberly D Darya Bigler 03/09/2014, 10:57 PM

## 2014-03-09 NOTE — Patient Instructions (Signed)
Preeclampsia and Eclampsia °Preeclampsia is a condition of high blood pressure during pregnancy. It can happen at 20 weeks or later in pregnancy. If high blood pressure occurs in the second half of pregnancy with no other symptoms, it is called gestational hypertension and goes away after the baby is born. If any of the symptoms listed below develop with gestational hypertension, it is then called preeclampsia. Eclampsia (convulsions) may follow preeclampsia. This is one of the reasons for regular prenatal checkups. Early diagnosis and treatment are very important to prevent eclampsia. °CAUSES  °There is no known cause of preeclampsia/eclampsia in pregnancy. There are several known conditions that may put the pregnant woman at risk, such as: °· The first pregnancy. °· Having preeclampsia in a past pregnancy. °· Having lasting (chronic) high blood pressure. °· Having multiples (twins, triplets). °· Being age 35 or older. °· African American ethnic background. °· Having kidney disease or diabetes. °· Medical conditions such as lupus or blood diseases. °· Being overweight (obese). °SYMPTOMS  °· High blood pressure. °· Headaches. °· Sudden weight gain. °· Swelling of hands, face, legs, and feet. °· Protein in the urine. °· Feeling sick to your stomach (nauseous) and throwing up (vomiting). °· Vision problems (blurred or double vision). °· Numbness in the face, arms, legs, and feet. °· Dizziness. °· Slurred speech. °· Preeclampsia can cause growth retardation in the fetus. °· Separation (abruption) of the placenta. °· Not enough fluid in the amniotic sac (oligohydramnios). °· Sensitivity to bright lights. °· Belly (abdominal) pain. °DIAGNOSIS  °If protein is found in the urine in the second half of pregnancy, this is considered preeclampsia. Other symptoms mentioned above may also be present. °TREATMENT  °It is necessary to treat this. °· Your caregiver may prescribe bed rest early in this condition. Plenty of rest and  salt restriction may be all that is needed. °· Medicines may be necessary to lower blood pressure if the condition does not respond to more conservative measures. °· In more severe cases, hospitalization may be needed: °· For treatment of blood pressure. °· To control fluid retention. °· To monitor the baby to see if the condition is causing harm to the baby. °· Hospitalization is the best way to treat the first sign of preeclampsia. This is so the mother and baby can be watched closely and blood tests can be done effectively and correctly. °· If the condition becomes severe, it may be necessary to induce labor or to remove the infant by surgical means (cesarean section). The best cure for preeclampsia/eclampsia is to deliver the baby. °Preeclampsia and eclampsia involve risks to mother and infant. Your caregiver will discuss these risks with you. Together, you can work out the best possible approach to your problems. Make sure you keep your prenatal visits as scheduled. Not keeping appointments could result in a chronic or permanent injury, pain, disability to you, and death or injury to you or your unborn baby. If there is any problem keeping the appointment, you must call to reschedule. °HOME CARE INSTRUCTIONS  °· Keep your prenatal appointments and tests as scheduled. °· Tell your caregiver if you have any of the above risk factors. °· Get plenty of rest and sleep. °· Eat a balanced diet that is low in salt, and do not add salt to your food. °· Avoid stressful situations. °· Only take over-the-counter and prescriptions medicines for pain, discomfort, or fever as directed by your caregiver. °SEEK IMMEDIATE MEDICAL CARE IF:  °· You develop severe swelling   anywhere in the body. This usually occurs in the legs. °· You gain 05 lb/2.3 kg or more in a week. °· You develop a severe headache, dizziness, problems with your vision, or confusion. °· You have abdominal pain, nausea, or vomiting. °· You have a seizure. °· You  have trouble moving any part of your body, or you develop numbness or problems speaking. °· You have bruising or abnormal bleeding from anywhere in the body. °· You develop a stiff neck. °· You pass out. °MAKE SURE YOU:  °· Understand these instructions. °· Will watch your condition. °· Will get help right away if you are not doing well or get worse. °Document Released: 10/30/2000 Document Revised: 01/25/2012 Document Reviewed: 06/15/2008 °ExitCare® Patient Information ©2014 [employer]. ° °

## 2014-03-09 NOTE — Progress Notes (Signed)
Kellie Joinerndreana Williams is a 23 y.o. G1P0000 at 5463w0d by LMP admitted for induction of labor due to Pre-eclamptic toxemia of pregnancy..  Subjective: Pt comfortable with FB in place. Feeling cramps and starting   Objective: BP 140/90  Pulse 80  Temp(Src) 98 F (36.7 C) (Oral)  Resp 18  Ht 5\' 3"  (1.6 m)  Wt 109.77 kg (242 lb)  BMI 42.88 kg/m2  LMP 06/14/2013     Filed Vitals:   03/09/14 1130 03/09/14 1213 03/09/14 1539 03/09/14 1641  BP: 135/80 129/78 137/88 140/90  Pulse: 89 78 70 80  Temp:      TempSrc:      Resp: 18 18 18 18   Height:      Weight:         FHT:  FHR: 130s bpm, variability: moderate,  accelerations:  Present,  decelerations:  Absent UC:   q885min SVE:   Dilation: 2 (around FB) Effacement (%): 50 Station: -3 Exam by:: Dr. Ike Benedom  Labs: Lab Results  Component Value Date   WBC 11.6* 03/09/2014   HGB 12.8 03/09/2014   HCT 37.7 03/09/2014   MCV 93.1 03/09/2014   PLT 211 03/09/2014   CMP     Component Value Date/Time   NA 143 03/09/2014 1110   K 4.0 03/09/2014 1110   CL 106 03/09/2014 1110   CO2 24 03/09/2014 1110   GLUCOSE 75 03/09/2014 1110   BUN 4* 03/09/2014 1110   CREATININE 0.62 03/09/2014 1110   CREATININE 0.53 02/12/2014 1240   CREATININE 0.53 02/12/2014 1233   CALCIUM 9.4 03/09/2014 1110   PROT 6.1 03/09/2014 1110   ALBUMIN 2.9* 03/09/2014 1110   AST 15 03/09/2014 1110   ALT 11 03/09/2014 1110   ALKPHOS 177* 03/09/2014 1110   BILITOT 0.3 03/09/2014 1110   GFRNONAA >90 03/09/2014 1110   GFRAA >90 03/09/2014 1110   Normal Pr:Cr ratio   Assessment / Plan: Induction of labor due to preeclampsia,  progressing well on pitocin  Labor: Progressing normally and FB in place will augment with dose of cytotec 25PV Preeclampsia:  no signs or symptoms of toxicity Fetal Wellbeing:  Category I Pain Control:  Labor support without medications I/D:  GBS pos will tx with abx Anticipated MOD:  NSVD  Minta BalsamMichael R Freddie Dymek 03/09/2014, 5:31 PM

## 2014-03-10 ENCOUNTER — Encounter (HOSPITAL_COMMUNITY): Payer: Self-pay | Admitting: *Deleted

## 2014-03-10 ENCOUNTER — Inpatient Hospital Stay (HOSPITAL_COMMUNITY): Payer: Medicaid Other | Admitting: Anesthesiology

## 2014-03-10 ENCOUNTER — Encounter (HOSPITAL_COMMUNITY): Payer: Medicaid Other | Admitting: Anesthesiology

## 2014-03-10 DIAGNOSIS — Z6841 Body Mass Index (BMI) 40.0 and over, adult: Secondary | ICD-10-CM

## 2014-03-10 DIAGNOSIS — IMO0002 Reserved for concepts with insufficient information to code with codable children: Secondary | ICD-10-CM

## 2014-03-10 DIAGNOSIS — O99344 Other mental disorders complicating childbirth: Secondary | ICD-10-CM

## 2014-03-10 LAB — CBC
HCT: 38.8 % (ref 36.0–46.0)
HEMATOCRIT: 38 % (ref 36.0–46.0)
Hemoglobin: 13.2 g/dL (ref 12.0–15.0)
Hemoglobin: 13.3 g/dL (ref 12.0–15.0)
MCH: 32.1 pg (ref 26.0–34.0)
MCH: 32 pg (ref 26.0–34.0)
MCHC: 34.7 g/dL (ref 30.0–36.0)
MCHC: 34.3 g/dL (ref 30.0–36.0)
MCV: 92.5 fL (ref 78.0–100.0)
MCV: 93.5 fL (ref 78.0–100.0)
PLATELETS: 201 10*3/uL (ref 150–400)
PLATELETS: 184 10*3/uL (ref 150–400)
RBC: 4.11 MIL/uL (ref 3.87–5.11)
RBC: 4.15 MIL/uL (ref 3.87–5.11)
RDW: 12.9 % (ref 11.5–15.5)
RDW: 13 % (ref 11.5–15.5)
WBC: 18.4 10*3/uL — ABNORMAL HIGH (ref 4.0–10.5)
WBC: 20.2 10*3/uL — ABNORMAL HIGH (ref 4.0–10.5)

## 2014-03-10 LAB — GC/CHLAMYDIA PROBE AMP
CT Probe RNA: NEGATIVE
GC Probe RNA: NEGATIVE

## 2014-03-10 MED ORDER — ONDANSETRON HCL 4 MG PO TABS: 4.0000 mg | ORAL_TABLET | ORAL | Status: DC | PRN

## 2014-03-10 MED ORDER — SIMETHICONE 80 MG PO CHEW: 80.0000 mg | CHEWABLE_TABLET | ORAL | Status: DC | PRN

## 2014-03-10 MED ORDER — ZOLPIDEM TARTRATE 5 MG PO TABS: 5.0000 mg | ORAL_TABLET | Freq: Every evening | ORAL | Status: DC | PRN

## 2014-03-10 MED ORDER — FENTANYL 2.5 MCG/ML BUPIVACAINE 1/10 % EPIDURAL INFUSION (WH - ANES)
INTRAMUSCULAR | Status: DC | PRN
  Administered 2014-03-10: 14 mL/h via EPIDURAL

## 2014-03-10 MED ORDER — TERBUTALINE SULFATE 1 MG/ML IJ SOLN: 0.2500 mg | Freq: Once | INTRAMUSCULAR | Status: DC | PRN

## 2014-03-10 MED ORDER — OXYCODONE-ACETAMINOPHEN 5-325 MG PO TABS: 1.0000 | ORAL_TABLET | ORAL | Status: DC | PRN

## 2014-03-10 MED ORDER — SENNOSIDES-DOCUSATE SODIUM 8.6-50 MG PO TABS
2.0000 | ORAL_TABLET | ORAL | Status: DC
Start: 2014-03-11 — End: 2014-03-12
  Administered 2014-03-10 – 2014-03-11 (×2): 2 via ORAL
  Filled 2014-03-10 (×2): qty 2

## 2014-03-10 MED ORDER — FENTANYL 2.5 MCG/ML BUPIVACAINE 1/10 % EPIDURAL INFUSION (WH - ANES)
14.0000 mL/h | INTRAMUSCULAR | Status: DC | PRN
  Filled 2014-03-10 (×2): qty 125

## 2014-03-10 MED ORDER — WITCH HAZEL-GLYCERIN EX PADS: 1.0000 "application " | MEDICATED_PAD | CUTANEOUS | Status: DC | PRN

## 2014-03-10 MED ORDER — DIBUCAINE 1 % RE OINT: 1.0000 "application " | TOPICAL_OINTMENT | RECTAL | Status: DC | PRN

## 2014-03-10 MED ORDER — LANOLIN HYDROUS EX OINT: TOPICAL_OINTMENT | CUTANEOUS | Status: DC | PRN

## 2014-03-10 MED ORDER — DIPHENHYDRAMINE HCL 50 MG/ML IJ SOLN: 12.5000 mg | INTRAMUSCULAR | Status: DC | PRN

## 2014-03-10 MED ORDER — PHENYLEPHRINE 40 MCG/ML (10ML) SYRINGE FOR IV PUSH (FOR BLOOD PRESSURE SUPPORT)
80.0000 ug | PREFILLED_SYRINGE | INTRAVENOUS | Status: DC | PRN
  Filled 2014-03-10: qty 2

## 2014-03-10 MED ORDER — PHENYLEPHRINE 40 MCG/ML (10ML) SYRINGE FOR IV PUSH (FOR BLOOD PRESSURE SUPPORT)
80.0000 ug | PREFILLED_SYRINGE | INTRAVENOUS | Status: DC | PRN
  Filled 2014-03-10: qty 2
  Filled 2014-03-10: qty 10

## 2014-03-10 MED ORDER — IBUPROFEN 600 MG PO TABS
600.0000 mg | ORAL_TABLET | Freq: Four times a day (QID) | ORAL | Status: DC
  Administered 2014-03-10 – 2014-03-12 (×7): 600 mg via ORAL
  Filled 2014-03-10 (×7): qty 1

## 2014-03-10 MED ORDER — LIDOCAINE HCL (PF) 1 % IJ SOLN
INTRAMUSCULAR | Status: DC | PRN
Start: 2014-03-10 — End: 2014-03-14
  Administered 2014-03-10 (×2): 4 mL

## 2014-03-10 MED ORDER — BENZOCAINE-MENTHOL 20-0.5 % EX AERO: 1.0000 "application " | INHALATION_SPRAY | CUTANEOUS | Status: DC | PRN

## 2014-03-10 MED ORDER — EPHEDRINE 5 MG/ML INJ
10.0000 mg | INTRAVENOUS | Status: DC | PRN
  Filled 2014-03-10: qty 4
  Filled 2014-03-10: qty 2

## 2014-03-10 MED ORDER — PRENATAL MULTIVITAMIN CH
1.0000 | ORAL_TABLET | Freq: Every day | ORAL | Status: DC
  Administered 2014-03-11 – 2014-03-12 (×2): 1 via ORAL
  Filled 2014-03-10 (×2): qty 1

## 2014-03-10 MED ORDER — DIPHENHYDRAMINE HCL 25 MG PO CAPS: 25.0000 mg | ORAL_CAPSULE | Freq: Four times a day (QID) | ORAL | Status: DC | PRN

## 2014-03-10 MED ORDER — TETANUS-DIPHTH-ACELL PERTUSSIS 5-2.5-18.5 LF-MCG/0.5 IM SUSP: 0.5000 mL | Freq: Once | INTRAMUSCULAR | Status: DC

## 2014-03-10 MED ORDER — LACTATED RINGERS IV SOLN: 500.0000 mL | Freq: Once | INTRAVENOUS | Status: DC

## 2014-03-10 MED ORDER — FENTANYL 2.5 MCG/ML BUPIVACAINE 1/10 % EPIDURAL INFUSION (WH - ANES)
14.0000 mL/h | INTRAMUSCULAR | Status: DC | PRN
  Administered 2014-03-10: 14 mL/h via EPIDURAL

## 2014-03-10 MED ORDER — OXYTOCIN 40 UNITS IN LACTATED RINGERS INFUSION - SIMPLE MED
1.0000 m[IU]/min | INTRAVENOUS | Status: DC
  Administered 2014-03-10: 2 m[IU]/min via INTRAVENOUS

## 2014-03-10 MED ORDER — FENTANYL CITRATE 0.05 MG/ML IJ SOLN
100.0000 ug | INTRAMUSCULAR | Status: DC | PRN
  Administered 2014-03-10 (×3): 100 ug via INTRAVENOUS
  Filled 2014-03-10 (×3): qty 2

## 2014-03-10 MED ORDER — ONDANSETRON HCL 4 MG/2ML IJ SOLN: 4.0000 mg | INTRAMUSCULAR | Status: DC | PRN

## 2014-03-10 MED ORDER — EPHEDRINE 5 MG/ML INJ
10.0000 mg | INTRAVENOUS | Status: DC | PRN
  Filled 2014-03-10: qty 2

## 2014-03-10 NOTE — Progress Notes (Signed)
I have seen and examined this patient and I agree with the above. Arabella MerlesKimberly D Shaw 9:24 AM 03/10/2014

## 2014-03-10 NOTE — Progress Notes (Signed)
Kellie Deleon is a 23 y.o. G1P0000 at 4432w0d   Subjective: Patient uncomfortable with contractions q 2-3 min, pelvic pressure. Received x3 Fentanyl IV doses, planning for epidural, sitting up with anesthesia at bedside. FB came out at 2350.   Objective: BP 127/96  Pulse 107  Temp(Src) 98.6 F (37 C) (Oral)  Resp 20  Ht 5\' 3"  (1.6 m)  Wt 109.77 kg (242 lb)  BMI 42.88 kg/m2  LMP 06/14/2013      FHT:  FHR: 130 bpm, variability: moderate,  accelerations:  Present,  decelerations:  Absent (note x1 prolonged decel 0414 to 0418, suspected to be maternal HR due to sitting up/change of position only). UC:   Regular UCs q 2-3 min with some UI SVE:   Dilation: 4.5 Effacement (%): 60 Station: -2 Exam by:: B.Cagna,RN- not reexamined  Labs: Lab Results  Component Value Date   WBC 18.4* 03/10/2014   HGB 13.2 03/10/2014   HCT 38.0 03/10/2014   MCV 92.5 03/10/2014   PLT 201 03/10/2014    Assessment / Plan: IOL process- preeclampsia Progressing well, s/p FB out 2350.  - Started Pitocin 0049, continue to titrate up, currently at 8 milu/min  - Fentanyl IV - Epidural placement pending - Continue to monitor, FHT-1, reactive. Suspect prolonged x 4 min decel due to malpositioning of monitor following position change per nursing (matched maternal HR)  Saralyn PilarAlexander Karamalegos, DO [facility], PGY-1 03/10/2014, 4:38 AM  I have seen and examined this patient and I agree with the above. Tracing rev'd: was determined to be maternal tracing. Kellie Deleon 9:24 AM 03/10/2014

## 2014-03-10 NOTE — Progress Notes (Signed)
Kellie Deleon is a 23 y.o. G1P0000 at 2496w0d   Subjective: Patient s/p epidural (0530),comfortable, no complaints at this time.  Objective: BP 125/75  Pulse 108  Temp(Src) 98.2 F (36.8 C) (Oral)  Resp 20  Ht 5\' 3"  (1.6 m)  Wt 109.77 kg (242 lb)  BMI 42.88 kg/m2  SpO2 99%  LMP 06/14/2013      FHT:  FHR: 130s bpm, variability: moderate,  accelerations:  Present,  decelerations: absent recent UC:   Regular UCs q 2 min difficult tracing SVE:   Dilation: 9 Effacement (%): 100 Station: -3 Exam by:: hk  Labs: Lab Results  Component Value Date   WBC 18.4* 03/10/2014   HGB 13.2 03/10/2014   HCT 38.0 03/10/2014   MCV 92.5 03/10/2014   PLT 201 03/10/2014    Assessment / Plan: Kellie Deleon is a 23 y.o. G1P0000 at 10484w1d, admitted for IOL for pre-eclampsia, prior HA with elevated BPs, work-up with stable labs. Progressing well on Pitocin. (S/p FB out 2350)  Labor: progressing on pit, s/p AROM with pudendal needle then completed AROM once well engaged. Preeclampsia:  no signs or symptoms of toxicity, labs stable UPC 0.15, plt 211, LFTs nml, Cr 0.62 Fetal Wellbeing:  Category II - reactive, occasional variables.  Pain Control:  Epidural I/D:  GBS (positive), PCN prophylaxis >4 hrs Anticipated MOD:  NSVD Tawana ScaleMichael Ryan Rasool Rommel, MD OB Fellow 1:34 PM

## 2014-03-10 NOTE — Anesthesia Procedure Notes (Signed)
Epidural Patient location during procedure: OB Start time: 03/10/2014 4:40 AM End time: 03/10/2014 4:55 AM  Staffing Anesthesiologist: Lewie LoronGERMEROTH, Zeda Gangwer R Performed by: anesthesiologist   Preanesthetic Checklist Completed: patient identified, pre-op evaluation, timeout performed, IV checked, risks and benefits discussed and monitors and equipment checked  Epidural Patient position: sitting Prep: site prepped and draped and DuraPrep Patient monitoring: heart rate Approach: midline Location: L1-L2 Injection technique: LOR air and LOR saline  Needle:  Needle type: Tuohy  Needle gauge: 17 G Needle length: 9 cm Needle insertion depth: 8 cm Catheter type: closed end flexible Catheter size: 19 Gauge Catheter at skin depth: 14 cm Test dose: negative  Assessment Sensory level: T8 Events: blood not aspirated, injection not painful, no injection resistance, negative IV test and no paresthesia  Additional Notes Reason for block:procedure for pain

## 2014-03-10 NOTE — Anesthesia Preprocedure Evaluation (Signed)
Anesthesia Evaluation  Patient identified by MRN, date of birth, ID band Patient awake    Reviewed: Allergy & Precautions, H&P , NPO status , Patient's Chart, lab work & pertinent test results  Airway Mallampati: II TM Distance: >3 FB Neck ROM: Full    Dental   Pulmonary neg pulmonary ROS, former smoker,          Cardiovascular hypertension, Rhythm:Regular     Neuro/Psych  Headaches, PSYCHIATRIC DISORDERS Anxiety negative psych ROS   GI/Hepatic negative GI ROS, Neg liver ROS,   Endo/Other  Morbid obesity  Renal/GU negative Renal ROS     Musculoskeletal negative musculoskeletal ROS (+)   Abdominal   Peds  Hematology negative hematology ROS (+)   Anesthesia Other Findings   Reproductive/Obstetrics (+) Pregnancy                           Anesthesia Physical Anesthesia Plan  ASA: III  Anesthesia Plan: Epidural   Post-op Pain Management:    Induction:   Airway Management Planned:   Additional Equipment:   Intra-op Plan:   Post-operative Plan:   Informed Consent: I have reviewed the patients History and Physical, chart, labs and discussed the procedure including the risks, benefits and alternatives for the proposed anesthesia with the patient or authorized representative who has indicated his/her understanding and acceptance.     Plan Discussed with:   Anesthesia Plan Comments:         Anesthesia Quick Evaluation

## 2014-03-10 NOTE — Progress Notes (Signed)
Kellie Deleon is a 23 y.o. G1P0000 at 6333w0d   Subjective: Patient s/p epidural (0530), sleeping comfortably. Reports she is doing well without pain during contractions and legs appropriately numb. Denies LOF. Admits to bloody mucus discharge.   Objective: BP 135/83  Pulse 89  Temp(Src) 98.6 F (37 C) (Oral)  Resp 18  Ht 5\' 3"  (1.6 m)  Wt 109.77 kg (242 lb)  BMI 42.88 kg/m2  SpO2 99%  LMP 06/14/2013      FHT:  FHR: 145 bpm, variability: moderate,  accelerations:  Present,  decelerations:  Present x 1 late decel 0620, prompt return to normal, occasional variable decel UC:   Regular UCs q 2-4 min SVE:   Dilation: 6 Effacement (%): 80 Station: -2 Exam by:: Odelia GageAlexander Karmalegos DO  Labs: Lab Results  Component Value Date   WBC 18.4* 03/10/2014   HGB 13.2 03/10/2014   HCT 38.0 03/10/2014   MCV 92.5 03/10/2014   PLT 201 03/10/2014    Assessment / Plan: Kellie Deleon is a 23 y.o. G1P0000 at 535w1d, admitted for IOL for pre-eclampsia, prior HA with elevated BPs, work-up with stable labs. Progressing well on Pitocin. (S/p FB out 2350)  Labor: Progressing on Pitocin, will continue to increase then AROM Preeclampsia:  no signs or symptoms of toxicity, labs stable UPC 0.15, plt 211, LFTs nml, Cr 0.62 Fetal Wellbeing:  Category II - reactive, questionable x1 late decel with good variability and subsequent accels (prior prolonged late decel likely d/t maternal tracing), occasional variables. Change position to Right-lateral. Pain Control:  Epidural I/D:  GBS (positive), PCN prophylaxis >4 hrs Anticipated MOD:  NSVD  Saralyn PilarAlexander Karamalegos, DO [facility], PGY-1 03/10/2014, 6:34 AM

## 2014-03-10 NOTE — Lactation Note (Signed)
This note was copied from the chart of Kellie Deleon. Lactation Consultation Note  Patient Name: Kellie Deleon ZOXWR'UToday's Date: 03/10/2014 Reason for consult: Initial assessment Initial assessment of baby 7 hours of life. Mom concerned that baby not latching onto breast. Mom had attempted to latch baby, and was performing STS. Assisted mom to attempt to latch baby in the football position. Mom able to hand express colostrum and offered baby drops. Baby would not latch. Discussed and demonstrated waking techniques and discussed feeding cues. Reviewed basics and encouraged lots of STS, and enc FOB to offer STS too. Reviewed how to achieve a deep latch and what to look for with a proper latch. Enc mom to allow baby to have a bath and try to latch again during STS following bath. Reviewed [facility] brochure, mom aware of OP/BFSG services and community resources contact information. Enc mom to call out for assistance with latching as needed.  Maternal Data Infant to breast within first hour of birth: Yes Has patient been taught Hand Expression?: Yes Does the patient have breastfeeding experience prior to this delivery?: No  Feeding Feeding Type: Breast Fed Length of feed: 0 min  LATCH Score/Interventions Latch: Too sleepy or reluctant, no latch achieved, no sucking elicited. Intervention(s): Skin to skin;Teach feeding cues;Waking techniques Intervention(s): Adjust position;Assist with latch;Breast compression  Audible Swallowing: None Intervention(s): Skin to skin;Hand expression Intervention(s): Skin to skin;Hand expression;Alternate breast massage  Type of Nipple: Everted at rest and after stimulation  Comfort (Breast/Nipple): Soft / non-tender     Hold (Positioning): Assistance needed to correctly position infant at breast and maintain latch. Intervention(s): Support Pillows;Breastfeeding basics reviewed;Position options;Skin to skin  LATCH Score: 5  Lactation Tools  Discussed/Used     Consult Status Consult Status: Follow-up Follow-up type: In-patient    Sherlyn HayJennifer D Konnie Noffsinger 03/10/2014, 10:42 PM

## 2014-03-10 NOTE — Progress Notes (Signed)
Kellie Deleon is a 23 y.o. G1P0000 at 6353w0d   Subjective: Patient s/p epidural (0530),comfortable, no complaints at this time.  Objective: BP 127/80  Pulse 85  Temp(Src) 98 F (36.7 C) (Oral)  Resp 18  Ht 5\' 3"  (1.6 m)  Wt 109.77 kg (242 lb)  BMI 42.88 kg/m2  SpO2 99%  LMP 06/14/2013      FHT:  FHR: 145 bpm, variability: moderate,  accelerations:  Present,  decelerations:  Present x 1 late decel 0620, prompt return to normal, occasional variable decel UC:   Regular UCs q 2 min SVE:   Dilation: 7 Effacement (%): 90 Station: -3 Exam by:: Dr. Ike Deleon  Labs: Lab Results  Component Value Date   WBC 18.4* 03/10/2014   HGB 13.2 03/10/2014   HCT 38.0 03/10/2014   MCV 92.5 03/10/2014   PLT 201 03/10/2014    Assessment / Plan: Kellie Deleon is a 23 y.o. G1P0000 at 1662w1d, admitted for IOL for pre-eclampsia, prior HA with elevated BPs, work-up with stable labs. Progressing well on Pitocin. (S/p FB out 2350)  Labor: Progressing on Pitocin, will continue to increase then AROM Preeclampsia:  no signs or symptoms of toxicity, labs stable UPC 0.15, plt 211, LFTs nml, Cr 0.62 Fetal Wellbeing:  Category II - reactive, occasional variables.  Pain Control:  Epidural I/D:  GBS (positive), PCN prophylaxis >4 hrs Anticipated MOD:  NSVD Kellie ScaleMichael Ryan Tyreak Reagle, MD OB Fellow 10:52 AM

## 2014-03-11 MED ORDER — ACETAMINOPHEN 325 MG PO TABS: 650.0000 mg | ORAL_TABLET | Freq: Four times a day (QID) | ORAL | Status: DC | PRN

## 2014-03-11 NOTE — Lactation Note (Signed)
This note was copied from the chart of Kellie Deleon. Lactation Consultation Note  Patient Name: Kellie Deleon WUJWJ'XToday's Date: 03/11/2014 Reason for consult: Follow-up assessment;Difficult latch;Late preterm infant Follow-up assistance with feeding after mom pumped. Mom had previously attempted to nurse baby with #24 NS, but baby sleepy. Mom post-pumped 3ml. Assisted mom to use NS and curve-tipped syringe to feed baby 3 ml EBM, and then supplemented with 10ml of formula in the NS. Mom has very long nails and it was difficult for her to assist with managing the NS. Mom states that she is going to remove them ASAP. Baby tolerated feeding well. Plan is to feed again in this manner at 2200. Mom will attempt to latch baby without shield, then with shield, then post-pump and supplement with formula to give 12 ml according to feeding chart.   Maternal Data    Feeding Feeding Type: Breast Fed Length of feed: 10 min  LATCH Score/Interventions Latch: Repeated attempts needed to sustain latch, nipple held in mouth throughout feeding, stimulation needed to elicit sucking reflex. (Would only nurse for a few bursts before stopping and sleeping.) Intervention(s): Assist with latch  Audible Swallowing: None Intervention(s): Skin to skin;Hand expression  Type of Nipple: Everted at rest and after stimulation Intervention(s): Double electric pump  Comfort (Breast/Nipple): Soft / non-tender     Hold (Positioning): Assistance needed to correctly position infant at breast and maintain latch.  LATCH Score: 6  Lactation Tools Discussed/Used Tools: Nipple Shields Nipple shield size: 24 Breast pump type: Double-Electric Breast Pump   Consult Status Consult Status: Follow-up Follow-up type: In-patient    Sherlyn HayJennifer D Ganesh Deeg 03/11/2014, 8:17 PM

## 2014-03-11 NOTE — Progress Notes (Signed)
Post Partum Day 1 Subjective: Overall doing well, does report HA (right sided) last night afte taking Motrin (states she normally doesn't take Motrin, as it has given her headaches in the past), HA since resolved today. Occasional pelvic pain with cramping, stable vaginal bleeding (mild improvement, not worse). Tolerating ambulation, PO, voiding, +BM. Continues to breastfeed.  Admits persistent R > L LE ankle edema. Denies any HA, vision changes, abd pain, dizziness / lightheadedness.  Objective: Blood pressure 118/71, pulse 82, temperature 98.3 F (36.8 C), temperature source Oral, resp. rate 16, height 5\' 3"  (1.6 m), weight 109.77 kg (242 lb), last menstrual period 06/14/2013, SpO2 99.00%, unknown if currently breastfeeding.  Physical Exam:  General: alert and cooperative Lochia: appropriate Uterine Fundus: firm DVT Evaluation: No evidence of DVT seen on physical exam. Negative Homan's sign. No cords or calf tenderness. No significant calf/ankle edema.   Recent Labs  03/10/14 0305 03/10/14 1611  HGB 13.2 13.3  HCT 38.0 38.8    Assessment/Plan: Plan for discharge tomorrow >48 hours - Pain control: continue Motrin, add Tylenol - Hgb stable 13.3 - Continue breastfeeding, with Lactation consult - Contraception: OCPs   LOS: 2 days   Saralyn Pilarlexander Karamalegos 03/11/2014, 7:50 AM   I spoke with and examined patient and agree with resident's note and plan of care.  Tawana ScaleMichael Ryan Aadvik Roker, MD OB Fellow 03/11/2014 8:47 AM

## 2014-03-11 NOTE — Lactation Note (Signed)
This note was copied from the chart of Kellie Deleon. Lactation Consultation Note Follow up consult:  Mother pumped 1.5 ml of colostrum. Demonstrated spoon feeding to parents. Volume given 1 ml.  Baby sleepy. Encouraged mother to call if further assistance is needed.   Patient Name: Kellie Deleon ZOXWR'UToday's Date: 03/11/2014 Reason for consult: Follow-up assessment   Maternal Data    Feeding Feeding Type: Breast Fed  LATCH Score/Interventions Latch: Too sleepy or reluctant, no latch achieved, no sucking elicited. Intervention(s): Waking techniques Intervention(s): Adjust position;Assist with latch  Audible Swallowing: None Intervention(s): Hand expression  Type of Nipple: Everted at rest and after stimulation Intervention(s):  (shield)  Comfort (Breast/Nipple): Soft / non-tender     Hold (Positioning): Assistance needed to correctly position infant at breast and maintain latch.  LATCH Score: 5  Lactation Tools Discussed/Used     Consult Status Consult Status: Follow-up Date: 03/12/14 Follow-up type: In-patient    Dulce SellarRuth Boschen Berkelhammer 03/11/2014, 3:41 PM

## 2014-03-11 NOTE — Lactation Note (Signed)
This note was copied from the chart of Kellie Deleon. Lactation Consultation Note Assisted mother in fb hold with #24NS.  Baby would suck but would not sustain latch.  Needed lots of stimulation to suck for 5 min. Syringe fed while finger sucking the remaining .5ml of colostrum.   Mother keeping baby STS.   Recommend mother try again to breastfed again by 1900 and then postpump two more times this evening and give back what is pumped in syringe.   Patient Name: Kellie Deleon ZOXWR'UToday's Date: 03/11/2014 Reason for consult: Follow-up assessment   Maternal Data    Feeding Feeding Type: Breast Fed Length of feed: 5 min  LATCH Score/Interventions Latch: Repeated attempts needed to sustain latch, nipple held in mouth throughout feeding, stimulation needed to elicit sucking reflex. Intervention(s): Skin to skin;Waking techniques Intervention(s): Assist with latch;Breast massage  Audible Swallowing: A few with stimulation Intervention(s): Hand expression Intervention(s): Alternate breast massage  Type of Nipple: Everted at rest and after stimulation Intervention(s):  (shield)  Comfort (Breast/Nipple): Soft / non-tender     Hold (Positioning): Assistance needed to correctly position infant at breast and maintain latch.  LATCH Score: 7  Lactation Tools Discussed/Used Tools: Nipple Dorris CarnesShields   Consult Status Consult Status: Follow-up Date: 03/12/14 Follow-up type: In-patient    Dulce SellarRuth Boschen Juliany Daughety 03/11/2014, 4:18 PM

## 2014-03-11 NOTE — Lactation Note (Signed)
This note was copied from the chart of Kellie Deleon. Lactation Consultation Note Follow up consult:  Early term baby 2763w2d, sleepy at the breast, weak suck Mother has short shaft nipples that invert when compressed.  Provided mother with shells & hand pump and reviewed use. Mother able to express drops of colostrum.  Attempted to bf in both cross cradle and fb hold but baby would not sustain suck.  Introduced  #24NS to stimulate baby to suck.  Size #20NS fit mother better but did not elicit suck.  With NS intermittent sucks observed.  Colostrum viewed in shield after feeding of 15 min. Set up DEBP.  Plan is for mother to postpump 4-6 times a day for 15 min.  Today mother will pump 2-3 times. Briefly discussed spoon feeding any colostrum that is pumped.  Reviewed feeding cues, cluster feeding. breast massage, milk storage, and cleaning pump.   Encouraged mother to call for assistance with spoon feeding.   Patient Name: Kellie Deleon ZOXWR'UToday's Date: 03/11/2014 Reason for consult: Follow-up assessment   Maternal Data    Feeding Feeding Type: Breast Fed  LATCH Score/Interventions Latch: Repeated attempts needed to sustain latch, nipple held in mouth throughout feeding, stimulation needed to elicit sucking reflex. Intervention(s): Skin to skin;Waking techniques;Teach feeding cues Intervention(s): Adjust position;Assist with latch;Breast massage  Audible Swallowing: A few with stimulation Intervention(s): Hand expression Intervention(s): Alternate breast massage  Intervention(s): Shells;Double electric pump  Comfort (Breast/Nipple): Soft / non-tender     Hold (Positioning): Assistance needed to correctly position infant at breast and maintain latch. Intervention(s): Breastfeeding basics reviewed;Support Pillows;Position options;Skin to skin     Lactation Tools Discussed/Used Tools: Shells;Nipple Dorris CarnesShields;Pump Nipple shield size: 24 Breast pump type:  Double-Electric Breast Pump WIC Program: Yes Pump Review: Setup, frequency, and cleaning;Milk Storage Initiated by:: Dahlia Byesuth Berkelhammer RN Date initiated:: 03/11/14   Consult Status Consult Status: Follow-up Date: 03/12/14 Follow-up type: In-patient    Dulce SellarRuth Boschen Berkelhammer 03/11/2014, 12:13 PM

## 2014-03-11 NOTE — Anesthesia Postprocedure Evaluation (Signed)
Anesthesia Post Note  Patient: Kellie Deleon  Procedure(s) Performed: * No procedures listed *  Anesthesia type: Epidural  Patient location: Mother/Baby  Post pain: Pain level controlled  Post assessment: Post-op Vital signs reviewed  Last Vitals:  Filed Vitals:   03/11/14 0546  BP: 118/71  Pulse: 82  Temp: 36.8 C  Resp: 16    Post vital signs: Reviewed  Level of consciousness:alert  Complications: No apparent anesthesia complications

## 2014-03-12 ENCOUNTER — Encounter: Payer: Self-pay | Admitting: Obstetrics and Gynecology

## 2014-03-12 MED ORDER — IBUPROFEN 600 MG PO TABS: 600.0000 mg | ORAL_TABLET | Freq: Four times a day (QID) | ORAL | Status: DC

## 2014-03-12 NOTE — Progress Notes (Signed)
Ur chart review completed.  

## 2014-03-12 NOTE — Discharge Instructions (Signed)

## 2014-03-12 NOTE — Discharge Summary (Signed)
I examined pt and agree with documentation above and resident plan of care. Kellie Deleon  

## 2014-03-12 NOTE — Discharge Summary (Addendum)
Obstetric Discharge Summary Reason for Admission: induction of labor GHTN, r/o pre-eclampsia, +HA Prenatal Procedures: NST Intrapartum Procedures: spontaneous vaginal delivery Postpartum Procedures: none Complications-Operative and Postpartum: 1st degree perineal laceration Hemoglobin  Date Value Ref Range Status  03/10/2014 13.3  12.0 - 15.0 g/dL Final     HCT  Date Value Ref Range Status  03/10/2014 38.8  36.0 - 46.0 % Final    Discharge Diagnoses: Term Pregnancy-delivered, GHTN / Pre-eclampsia  Hospital Course:  Kellie Deleon is a 23 y.o. G1P1001 [facility] Initially with elevated BPs, without severe range BP / symptoms, labs normal with UPC 0.15, LFTs nml, plt 211, no indication for Mag treatment. She had a uncomplicated SVD >24 hours after admission. Postpartum course uncomplicated, stable BPs, occasional HA (resolved), able to ambulate, tolerate PO and void normally. Plan to continue breastfeeding, with assistance of lactation consultants. Contraception plans for OCPs. She was discharged home with instructions for postpartum care.    Delivery Note  At 3:07 PM a viable female was delivered via Vaginal, Spontaneous Delivery (Presentation: Middle Occiput Posterior). APGAR: , ; weight .  Placenta status: Intact, Spontaneous. Cord: 3 vessels with the following complications: . Cord pH: not sent  Anesthesia: Epidural  Episiotomy: None  Lacerations: 1st degree  Suture Repair: 3.0 monocryl on CT  Est. Blood Loss (mL): 250  Mom to postpartum. Baby to Couplet care / Skin to Skin.  Called to delivery. Mother pushed over 1st degree tear in direct OP. Infant delivered to maternal abdomen. Cord clamped and cut. Active management of 3rd stage with traction with pit to follow. Placenta delivered intact with 3v cord. Tear repaired with 3.0 monocryl on CT in usual manner. EBL 250. Counts correct. Hemostatic.  Minta BalsamMichael R Odom  03/10/2014, 3:20 PM  Physical Exam:   General: alert and cooperative, well-appearing, NAD Lochia: appropriate Uterine Fundus: firm DVT Evaluation: No evidence of DVT seen on physical exam. Negative Homan's sign. No cords or calf tenderness. No significant calf/ankle edema.  Discharge Information: Date: 03/12/2014 Activity: pelvic rest Diet: routine Medications: PNV and Ibuprofen Baby feeding: plans to breastfeed Contraception: oral contraceptives (estrogen/progesterone) Condition: stable Instructions: refer to practice specific booklet Discharge to: home   Newborn Data: Live born female  Birth Weight: 6 lb 3.7 oz (2825 g) APGAR: 6, 8  Home with mother.  Saralyn PilarAlexander Karamalegos, DO [facility], PGY-1  03/12/2014, 7:24 AM

## 2014-03-12 NOTE — Lactation Note (Addendum)
This note was copied from the chart of Kellie Deleon. Lactation Consultation Note Follow up visit with mom. She reports that baby is sleepy at the breast. Attempted to latch baby without NS. Baby would take a few sucks but nonnutritive sucking noted. Used NS and baby did better but few sucks noted, no swallows heard. Used some formula is NS and baby did a little better. Mom committed to breast feeding so will stay as baby patient. Mom pumped 10 cc's of whitish milk. Reports that breasts are feeling fuler this morning. OP appointment made for Thursday afternoon 4 pm. No questions at present. To call for assist prn.  Patient Name: Kellie Deleon VHQIO'NToday's Date: 03/12/2014 Reason for consult: Follow-up assessment   Maternal Data    Feeding Feeding Type: Breast Fed Length of feed: 10 min  LATCH Score/Interventions Latch: Repeated attempts needed to sustain latch, nipple held in mouth throughout feeding, stimulation needed to elicit sucking reflex.  Audible Swallowing: None  Type of Nipple: Everted at rest and after stimulation Intervention(s): Shells  Comfort (Breast/Nipple): Soft / non-tender     Hold (Positioning): Assistance needed to correctly position infant at breast and maintain latch. Intervention(s): Breastfeeding basics reviewed;Support Pillows;Position options  LATCH Score: 6  Lactation Tools Discussed/Used Tools: Shells;Nipple Shields Nipple shield size: 20 Breast pump type: Double-Electric Breast Pump WIC Program: Yes   Consult Status Consult Status: Follow-up Date: 03/13/14 Follow-up type: In-patient    Kellie Deleon 03/12/2014, 9:57 AM

## 2014-03-12 NOTE — Lactation Note (Signed)
This note was copied from the chart of Kellie Deleon. Lactation Consultation Note Baby lost down to under 5lbs. Request LC assistance. Using #24 NS, baby wasn't interested much in BF. Applied #20 NS, inserted colostrum mom DEBP into NS w/syring. Squirted a sm. Amount into baby's mouth. Instructed parents tips on stimulating baby. Noted good feeding and residue of colostrum to both breast after feeding. Encouraged football hold and breast massage. Pre-pump to "Prime" breast then BF and post pump. Baby relaxed after feeding. FOB supportive at bedside helping mom. Patient Name: Kellie Deleon ZOXWR'UToday's Date: 03/12/2014 Reason for consult: Follow-up assessment;Infant < 6lbs   Maternal Data    Feeding Feeding Type: Breast Fed Length of feed: 35 min  LATCH Score/Interventions Latch: Grasps breast easily, tongue down, lips flanged, rhythmical sucking. Intervention(s): Skin to skin;Teach feeding cues;Waking techniques Intervention(s): Adjust position;Assist with latch;Breast massage;Breast compression  Audible Swallowing: Spontaneous and intermittent Intervention(s): Hand expression Intervention(s): Hand expression;Alternate breast massage  Type of Nipple: Everted at rest and after stimulation (small nipples/e/edema) Intervention(s): Shells;Double electric pump Intervention(s):  (#20 NS)  Comfort (Breast/Nipple): Soft / non-tender     Hold (Positioning): Assistance needed to correctly position infant at breast and maintain latch. Intervention(s): Breastfeeding basics reviewed;Support Pillows;Position options  LATCH Score: 9  Lactation Tools Discussed/Used Tools: Shells;Nipple Dorris CarnesShields;Pump Nipple shield size: 20 Shell Type: Inverted Breast pump type: Double-Electric Breast Pump   Consult Status Consult Status: Follow-up Date: 03/12/14 Follow-up type: In-patient    Kellie Deleon 03/12/2014, 2:31 AM

## 2014-03-13 ENCOUNTER — Ambulatory Visit: Payer: Self-pay

## 2014-03-13 NOTE — Lactation Note (Signed)
This note was copied from the chart of Kellie Deleon. Lactation Consultation Note  Patient Name: Kellie Deleon GNFAO'ZToday's Date: 03/13/2014 Reason for consult: Follow-up assessment Follow-up assessment prior to discharge, baby 68 hours of life. Mom reports becoming engorged. [facility]. [facility]. Assisted mom to latch baby using #20 NS. Mom given a #24 to use if needed after she gets home. Discussed engorgement treatment, putting baby to breast often and pumping for comfort, and using bags of frozen vegetables applied to breast 10-15 minutes to increase comfort. Baby effectively latching and transferring milk as evidenced by effective sucking, swallowing, and milk seen in NS. Reviewed how to listen for swallows, and determine if baby is transferring milk. Discussed massaging breast while baby is nursing to soften tight/firm places of breast. Mom's other breast dripping as she nursed. Mom aware of OP/BFSG services, has community resources information, and [facility] phone number if needs assistance. Enc mom to drink lots of water, and to rest and focus on breastfeeding to prevent mastitis. Discussed mastitis.   Maternal Data    Feeding Feeding Type: Breast Fed (Simultaneous filing. User may not have seen previous data.) Length of feed: 30 min  LATCH Score/Interventions Latch: Grasps breast easily, tongue down, lips flanged, rhythmical sucking. Intervention(s): Skin to skin Intervention(s): Adjust position;Assist with latch  Audible Swallowing: Spontaneous and intermittent Intervention(s): Skin to skin  Type of Nipple: Flat (#20 NS.) Intervention(s): Double electric pump  Comfort (Breast/Nipple): Soft / non-tender  Interventions (Filling): Double electric pump  Hold (Positioning): No assistance needed to correctly position infant at breast.  LATCH Score: 9  Lactation Tools Discussed/Used Tools: Nipple Shields Nipple  shield size: 20;24 Breast pump type: Manual   Consult Status Consult Status: Complete    Sherlyn HayJennifer D Deniz Eskridge 03/13/2014, 11:42 AM

## 2014-03-14 ENCOUNTER — Encounter (HOSPITAL_COMMUNITY): Payer: Self-pay

## 2014-03-14 ENCOUNTER — Inpatient Hospital Stay (HOSPITAL_COMMUNITY)
Admission: AD | Admit: 2014-03-14 | Discharge: 2014-03-16 | DRG: 776 | Disposition: A | Discharge status: Home / Self Care | Payer: Medicaid Other | Source: Ambulatory Visit | Attending: Obstetrics & Gynecology | Admitting: Obstetrics & Gynecology

## 2014-03-14 DIAGNOSIS — O1415 Severe pre-eclampsia, complicating the puerperium: Principal | ICD-10-CM | POA: Diagnosis present

## 2014-03-14 DIAGNOSIS — IMO0002 Reserved for concepts with insufficient information to code with codable children: Secondary | ICD-10-CM

## 2014-03-14 DIAGNOSIS — Z87891 Personal history of nicotine dependence: Secondary | ICD-10-CM

## 2014-03-14 DIAGNOSIS — O9279 Other disorders of lactation: Secondary | ICD-10-CM | POA: Diagnosis present

## 2014-03-14 DIAGNOSIS — Z8249 Family history of ischemic heart disease and other diseases of the circulatory system: Secondary | ICD-10-CM

## 2014-03-14 DIAGNOSIS — O1213 Gestational proteinuria, third trimester: Secondary | ICD-10-CM

## 2014-03-14 DIAGNOSIS — J811 Chronic pulmonary edema: Secondary | ICD-10-CM | POA: Diagnosis present

## 2014-03-14 DIAGNOSIS — O99340 Other mental disorders complicating pregnancy, unspecified trimester: Secondary | ICD-10-CM

## 2014-03-14 LAB — URINALYSIS, ROUTINE W REFLEX MICROSCOPIC
BILIRUBIN URINE: NEGATIVE
Glucose, UA: NEGATIVE mg/dL
Ketones, ur: 15 mg/dL — AB
Leukocytes, UA: NEGATIVE
NITRITE: NEGATIVE
Protein, ur: 30 mg/dL — AB
Specific Gravity, Urine: 1.015 (ref 1.005–1.030)
Urobilinogen, UA: 0.2 mg/dL (ref 0.0–1.0)
pH: 7 (ref 5.0–8.0)

## 2014-03-14 LAB — COMPREHENSIVE METABOLIC PANEL
ALT: 29 U/L (ref 0–35)
AST: 27 U/L (ref 0–37)
Albumin: 2.8 g/dL — ABNORMAL LOW (ref 3.5–5.2)
Alkaline Phosphatase: 116 U/L (ref 39–117)
BUN: 10 mg/dL (ref 6–23)
CALCIUM: 8.5 mg/dL (ref 8.4–10.5)
CO2: 22 meq/L (ref 19–32)
Chloride: 104 mEq/L (ref 96–112)
Creatinine, Ser: 0.58 mg/dL (ref 0.50–1.10)
GFR calc Af Amer: >90 mL/min (ref >90)
Glucose, Bld: 80 mg/dL (ref 70–99)
Potassium: 3.4 mEq/L — ABNORMAL LOW (ref 3.7–5.3)
SODIUM: 141 meq/L (ref 137–147)
TOTAL PROTEIN: 6.1 g/dL (ref 6.0–8.3)
Total Bilirubin: 0.4 mg/dL (ref 0.3–1.2)

## 2014-03-14 LAB — CBC
HCT: 35.2 % — ABNORMAL LOW (ref 36.0–46.0)
HEMOGLOBIN: 11.7 g/dL — AB (ref 12.0–15.0)
MCH: 31.1 pg (ref 26.0–34.0)
MCHC: 33.2 g/dL (ref 30.0–36.0)
MCV: 93.6 fL (ref 78.0–100.0)
Platelets: 206 10*3/uL (ref 150–400)
RBC: 3.76 MIL/uL — AB (ref 3.87–5.11)
RDW: 12.8 % (ref 11.5–15.5)
WBC: 11.6 10*3/uL — ABNORMAL HIGH (ref 4.0–10.5)

## 2014-03-14 LAB — PROTEIN / CREATININE RATIO, URINE
CREATININE, URINE: 49.99 mg/dL
Protein Creatinine Ratio: 0.95 — ABNORMAL HIGH (ref 0.00–0.15)
TOTAL PROTEIN, URINE: 47.6 mg/dL

## 2014-03-14 LAB — URINE MICROSCOPIC-ADD ON

## 2014-03-14 LAB — MRSA PCR SCREENING: MRSA by PCR: NEGATIVE

## 2014-03-14 MED ORDER — MAGNESIUM SULFATE BOLUS VIA INFUSION
4.0000 g | Freq: Once | INTRAVENOUS | Status: AC
  Administered 2014-03-14: 4 g via INTRAVENOUS
  Filled 2014-03-14: qty 500

## 2014-03-14 MED ORDER — ACETAMINOPHEN 500 MG PO TABS
1000.0000 mg | ORAL_TABLET | Freq: Four times a day (QID) | ORAL | Status: DC | PRN
  Administered 2014-03-15: 1000 mg via ORAL
  Filled 2014-03-14: qty 2

## 2014-03-14 MED ORDER — ZOLPIDEM TARTRATE 5 MG PO TABS
5.0000 mg | ORAL_TABLET | Freq: Every evening | ORAL | Status: DC | PRN
  Administered 2014-03-15: 5 mg via ORAL
  Filled 2014-03-14: qty 1

## 2014-03-14 MED ORDER — PRENATAL MULTIVITAMIN CH: 1.0000 | ORAL_TABLET | Freq: Every day | ORAL | Status: DC

## 2014-03-14 MED ORDER — HYDROMORPHONE HCL PF 1 MG/ML IJ SOLN: 0.2000 mg | INTRAMUSCULAR | Status: DC | PRN

## 2014-03-14 MED ORDER — ONDANSETRON HCL 4 MG PO TABS: 4.0000 mg | ORAL_TABLET | Freq: Four times a day (QID) | ORAL | Status: DC | PRN

## 2014-03-14 MED ORDER — LORAZEPAM 1 MG PO TABS
1.0000 mg | ORAL_TABLET | Freq: Four times a day (QID) | ORAL | Status: DC | PRN
  Administered 2014-03-14: 1 mg via ORAL
  Filled 2014-03-14: qty 1

## 2014-03-14 MED ORDER — LACTATED RINGERS IV SOLN
INTRAVENOUS | Status: DC
  Administered 2014-03-14 – 2014-03-15 (×2): via INTRAVENOUS

## 2014-03-14 MED ORDER — MAGNESIUM SULFATE 40 G IN LACTATED RINGERS - SIMPLE
2.0000 g/h | INTRAVENOUS | Status: AC
  Administered 2014-03-14: 2 g/h via INTRAVENOUS
  Filled 2014-03-14: qty 500

## 2014-03-14 MED ORDER — HYDRALAZINE HCL 20 MG/ML IJ SOLN: 10.0000 mg | INTRAMUSCULAR | Status: DC | PRN

## 2014-03-14 MED ORDER — OXYCODONE-ACETAMINOPHEN 5-325 MG PO TABS: 1.0000 | ORAL_TABLET | ORAL | Status: DC | PRN

## 2014-03-14 MED ORDER — ONDANSETRON HCL 4 MG/2ML IJ SOLN: 4.0000 mg | Freq: Four times a day (QID) | INTRAMUSCULAR | Status: DC | PRN

## 2014-03-14 MED ORDER — OXYCODONE-ACETAMINOPHEN 5-325 MG PO TABS
2.0000 | ORAL_TABLET | ORAL | Status: DC | PRN
Start: 2014-03-14 — End: 2014-03-16
  Administered 2014-03-14 – 2014-03-15 (×4): 2 via ORAL
  Filled 2014-03-14 (×4): qty 2

## 2014-03-14 MED ORDER — DOCUSATE SODIUM 100 MG PO CAPS
100.0000 mg | ORAL_CAPSULE | Freq: Two times a day (BID) | ORAL | Status: DC | PRN
  Filled 2014-03-14: qty 1

## 2014-03-14 NOTE — MAU Note (Signed)
Bad headache started this morning, didn't know if she could or should take anything.  Also noted visual blurring this morning.  Home nurse was by- BP was elevated.  Vag del on 04/25, BP was up.  Milk is in, baby is doing well.bleeding is not as heavy

## 2014-03-14 NOTE — H&P (Signed)
[facility] Faculty Practice  History & Physical  Kellie Deleon is a 23 y.o. female G1P1001 who presents on PPD# 4 following IOL for GHTN resulting in full term NSVD (healthy baby girl). She was ruled out for pre-eclampsia at the time, and discharged home on PPD#2 with improved BP.  Patient reported that she developed a HA and blurred vision this morning, she did not take any medications for it. BP was checked today by Siri ColeBaby Love RN (home health nursing) reported elevated BP with SBP >160. She was advised to return to hospital for further evaluation.  On arrival to MAU, initial BP remained elevated at 160/103. Patient currently with headaches, feels like head is exploding.  Also reports blurry vision.  No RUQ/epigastric pain.  Prenatal History/Complications: - Recent previous pregnancy course complicated by 3rd trimester proteinuria and developed GHTN (non-severe range). Initial PIH work-up in hospital for delivery was negative for pre-eclampsia.   Past Medical History: Past Medical History  Diagnosis Date  . No significant past medical history   . Loss of teeth due to extraction   . PCOS (polycystic ovarian syndrome)   . Anxiety   . Headache(784.0)   . Bronchitis     Past Surgical History: Past Surgical History  Procedure Laterality Date  . Mouth surgery      Obstetrical History: OB History   Grav Para Term Preterm Abortions TAB SAB Ect Mult Living   1 1 1  0 0 0 0 0 0 1      Social History: History   Social History  . Marital Status: Married    Spouse Name: N/A    Number of Children: N/A  . Years of Education: N/A   Social History Main Topics  . Smoking status: Former Smoker    Quit date: 10/27/2012  . Smokeless tobacco: Never Used  . Alcohol Use: No     Comment: Occassional Use  . Drug Use: No     Comment: Marijuana use in 2013  . Sexual Activity: Yes    Birth Control/ Protection: None   Other Topics Concern  . None   Social History  Narrative  . None    Family History: Family History  Problem Relation Age of Onset  . Cancer Other   . Hypertension Brother     Allergies: Allergies  Allergen Reactions  . Latex Rash and Other (See Comments)    Burning     Prescriptions prior to admission  Medication Sig Dispense Refill  . acetaminophen (TYLENOL) 500 MG tablet Take 1,000 mg by mouth every 6 (six) hours as needed for pain.      Marland Kitchen. ibuprofen (ADVIL,MOTRIN) 600 MG tablet Take 1 tablet (600 mg total) by mouth every 6 (six) hours.  30 tablet  0  . Prenatal Vit-Fe Fumarate-FA (PRENATAL MULTIVITAMIN) TABS tablet Take 1 tablet by mouth daily at 12 noon.      . promethazine (PHENERGAN) 25 MG tablet Take 25 mg by mouth every 6 (six) hours as needed for nausea or vomiting.        Review of Systems - Negative except what is in HPI   Blood pressure 160/103, pulse 74, temperature 98.3 F (36.8 C), temperature source Oral, resp. rate 20, last menstrual period 06/14/2013, unknown if currently breastfeeding.  Gen - Well-appearing, NAD HEENT - NCAT, PERRL, EOMI, patent nares w/o congestion, oropharynx clear, MMM Neck - supple, non-tender, no LAD, no thyromegaly Heart - RRR, no murmurs heard Lungs - CTAB, no wheezing, crackles,  or rhonchi. Normal work of breathing. Abd - soft, NTND, no masses, +active BS Ext - non-tender, no edema, peripheral pulses intact, 3+ DTRs b/l Skin - warm, dry, no rashes Neuro - awake, alert, oriented, grossly non-focal, intact muscle strength 5/5 b/l, intact distal sensation to light touch, gait normal   Assessment: Kellie Deleon is a 23 y.o. G1P1001 with postpartum severe preeclampsia, readmitted on PPD#4.   Plan: 1. Admit to AICU 2. Start magnesium sulfate 4g IV load/2 g per hour x 24 hours 3. Hydralazine as needed; may need additional antihypertensives 4. Labs ordered, will follow up results and manage accordingly. 5. Analgesia as needed for headaches 6. Routine AICU care  Saralyn PilarAlexander  Karamalegos, DO [facility], PGY-1  03/14/2014, 4:40 PM   Attestation of Attending Supervision of Resident: Evaluation and management procedures were performed by the [facility] Resident under my supervision.  I have seen and examined the patient, reviewed the resident's note and chart, and I agree with the management and plan.  Jaynie CollinsUGONNA  ANYANWU, MD, FACOG Attending Obstetrician & Gynecologist Faculty Practice, [facility]

## 2014-03-15 ENCOUNTER — Encounter (HOSPITAL_COMMUNITY): Payer: Self-pay

## 2014-03-15 ENCOUNTER — Inpatient Hospital Stay (HOSPITAL_COMMUNITY): Payer: Medicaid Other

## 2014-03-15 ENCOUNTER — Ambulatory Visit (HOSPITAL_COMMUNITY): Admit: 2014-03-15 | Payer: Medicaid Other

## 2014-03-15 DIAGNOSIS — O1415 Severe pre-eclampsia, complicating the puerperium: Secondary | ICD-10-CM

## 2014-03-15 DIAGNOSIS — R0989 Other specified symptoms and signs involving the circulatory and respiratory systems: Secondary | ICD-10-CM

## 2014-03-15 DIAGNOSIS — O9279 Other disorders of lactation: Secondary | ICD-10-CM

## 2014-03-15 DIAGNOSIS — R0609 Other forms of dyspnea: Secondary | ICD-10-CM

## 2014-03-15 DIAGNOSIS — J811 Chronic pulmonary edema: Secondary | ICD-10-CM

## 2014-03-15 DIAGNOSIS — Z87891 Personal history of nicotine dependence: Secondary | ICD-10-CM

## 2014-03-15 LAB — COMPREHENSIVE METABOLIC PANEL
ALT: 38 U/L — ABNORMAL HIGH (ref 0–35)
AST: 31 U/L (ref 0–37)
Albumin: 2.8 g/dL — ABNORMAL LOW (ref 3.5–5.2)
Alkaline Phosphatase: 122 U/L — ABNORMAL HIGH (ref 39–117)
BILIRUBIN TOTAL: 0.5 mg/dL (ref 0.3–1.2)
BUN: 6 mg/dL (ref 6–23)
CALCIUM: 7.9 mg/dL — AB (ref 8.4–10.5)
CHLORIDE: 102 meq/L (ref 96–112)
CO2: 26 meq/L (ref 19–32)
Creatinine, Ser: 0.59 mg/dL (ref 0.50–1.10)
GFR calc Af Amer: >90 mL/min (ref >90)
GFR calc non Af Amer: >90 mL/min (ref >90)
Glucose, Bld: 99 mg/dL (ref 70–99)
Potassium: 3.3 mEq/L — ABNORMAL LOW (ref 3.7–5.3)
Sodium: 142 mEq/L (ref 137–147)
Total Protein: 5.8 g/dL — ABNORMAL LOW (ref 6.0–8.3)

## 2014-03-15 LAB — CBC
HCT: 37 % (ref 36.0–46.0)
Hemoglobin: 12.8 g/dL (ref 12.0–15.0)
MCH: 32.2 pg (ref 26.0–34.0)
MCHC: 34.6 g/dL (ref 30.0–36.0)
MCV: 93.2 fL (ref 78.0–100.0)
PLATELETS: 215 10*3/uL (ref 150–400)
RBC: 3.97 MIL/uL (ref 3.87–5.11)
RDW: 12.6 % (ref 11.5–15.5)
WBC: 11.4 10*3/uL — AB (ref 4.0–10.5)

## 2014-03-15 MED ORDER — FUROSEMIDE 10 MG/ML IJ SOLN
20.0000 mg | Freq: Once | INTRAMUSCULAR | Status: AC
  Administered 2014-03-15: 20 mg via INTRAVENOUS
  Filled 2014-03-15: qty 2

## 2014-03-15 MED ORDER — POTASSIUM CHLORIDE CRYS ER 20 MEQ PO TBCR
20.0000 meq | EXTENDED_RELEASE_TABLET | Freq: Two times a day (BID) | ORAL | Status: DC
  Administered 2014-03-15 – 2014-03-16 (×3): 20 meq via ORAL
  Filled 2014-03-15 (×3): qty 1

## 2014-03-15 MED ORDER — IOHEXOL 350 MG/ML SOLN
100.0000 mL | Freq: Once | INTRAVENOUS | Status: AC | PRN
  Administered 2014-03-15: 100 mL via INTRAVENOUS

## 2014-03-15 MED ORDER — ENOXAPARIN SODIUM 40 MG/0.4ML ~~LOC~~ SOLN
40.0000 mg | Freq: Every day | SUBCUTANEOUS | Status: AC
  Administered 2014-03-15 (×2): 40 mg via SUBCUTANEOUS
  Filled 2014-03-15 (×2): qty 0.4

## 2014-03-15 NOTE — Consult Note (Signed)
Lactation note on this [age] G1P1 mom, readmission 5 days post partum for preeclampsia.  The baby is 37 6/7 weeks corrected gestation .Mom is engorged, and I gave her ice to help with this, and instructed hertot ice for 20 minutes at a time, every 1-2 hours. The baby is in the room with the mom. Mom agreed to have her nurse call for lactation, to help with latching, when the baby begins to cue. Baby asleep in her crib at this time.

## 2014-03-15 NOTE — Progress Notes (Signed)
Post Partum Day 5, readmission day 1 readmitted for postpartum preeclampsia. Subjective: tolerating PO and feel more comfortable since diuresis.after lasix.  Objective: Blood pressure 137/93, pulse 99, temperature 98.8 F (37.1 C), temperature source Oral, resp. rate 19, height 5\' 3"  (1.6 m), weight 214 lb 9 oz (97.325 kg), SpO2 95.00%, unknown if currently breastfeeding.  Physical Exam:  Weight 234 on admit, today 214 (bed scale) General: cooperative, fatigued and no distress she still has neck pain which has been present at left neck base since admit. Lungs: bilateral basilar crackles with deep inspiration, late sonorous expiratory ronchi. Lochia: appropriate Uterine Fundus: firm Incision: none DVT Evaluation: No evidence of DVT seen on physical exam.  Intake/Output Summary (Last 24 hours) at 03/15/14 0717 Last data filed at 03/15/14 0600  Gross per 24 hour  Intake 1487.92 ml  Output   7200 ml  Net -5712.08 ml   BMET    Component Value Date/Time   NA 142 03/15/2014 0520   K 3.3* 03/15/2014 0520   CL 102 03/15/2014 0520   CO2 26 03/15/2014 0520   GLUCOSE 99 03/15/2014 0520   BUN 6 03/15/2014 0520   CREATININE 0.59 03/15/2014 0520   CREATININE 0.53 02/12/2014 1240   CREATININE 0.53 02/12/2014 1233   CALCIUM 7.9* 03/15/2014 0520   GFRNONAA >90 03/15/2014 0520   GFRAA >90 03/15/2014 0520     Recent Labs  03/14/14 1607 03/15/14 0520  HGB 11.7* 12.8  HCT 35.2* 37.0    Assessment/Plan: 1.Postpartum severe preeclampsia, with pulmonary edema, responding to diuresis and Mag sulfate. Plan: continue Mag Sulfate 24 hrs 18:30.           Repeat lasix            Add Potassium supplement          Cardiac echo to confirm ejection fraction.   LOS: 1 day   Tilda BurrowJohn V Carigan Lister 03/15/2014, 7:14 AM

## 2014-03-15 NOTE — Progress Notes (Signed)
UR completed 

## 2014-03-15 NOTE — Progress Notes (Signed)
  Echocardiogram 2D Echocardiogram has been performed.  Kellie Deleon A Kellie Deleon 03/15/2014, 11:28 AM

## 2014-03-15 NOTE — Progress Notes (Signed)
Patient woke up coughing and claims, " I feel like  I am under the water."  Lungs with few rales on the lower bases.  Alert and oriented.  Assisted to bathroom and voided 600cc clear urine.  Vital signs recorded.  Dr. Emelda FearFerguson notified.

## 2014-03-15 NOTE — Progress Notes (Signed)
Post Partum Day 5, readmission day 1, admitted for postpartum preeclampsia,  Subjective: voiding, tolerating PO and with the development of a sense of SOB , accompanied by a frothy lite productive cough. She denies chest pain, but does continue to have the pain in the base of the neck on the left that has been present since admit.  Objective: Blood pressure 134/94, pulse 103, temperature 97.8 F (36.6 C), temperature source Oral, resp. rate 19, height 5\' 3"  (1.6 m), weight 234 lb (106.142 kg), SpO2 97.00%, unknown if currently breastfeeding. O2 sat was transiently down to 90%, with spont return to high 90's on Room air Physical Exam:  General: alert, cooperative and no distress Lochia: appropriate Chest : no appreciable rales at this time, there was question of rales earlier in shift on RN eval. COR RRR, pulse 100 DVT Evaluation: No evidence of DVT seen on physical exam. Significant edema 3+ pretibial , but less than earlier by RN eval  Intake/Output Summary (Last 24 hours) at 03/15/14 0228 Last data filed at 03/15/14 0200  Gross per 24 hour  Intake 1007.92 ml  Output   3400 ml  Net -2392.08 ml     Recent Labs  03/14/14 1607  HGB 11.7*  HCT 35.2*    Assessment/Plan: Postpartum severe preeclampsia , postpartum day 5 (readmit) Pulmonary edema as fluid mobilizes R.o PE  PLAN;   Lasix 20 mg IV now            CT angio to r/o PE             Lovenox 40 qd prophylaxis   LOS: 1 day   Kellie Deleon 03/15/2014, 2:21 AM

## 2014-03-15 NOTE — MAU Provider Note (Signed)
`````  Attestation of Attending Supervision of Advanced Practitioner: Evaluation and management procedures were performed by the PA/NP/CNM/OB Fellow under my supervision/collaboration. Chart reviewed and agree with management and plan.  Kellie BurrowJohn V Harwood Deleon 03/15/2014 10:00 PM   `````Attestation of Attending Supervision of Advanced Practitioner: Evaluation and management procedures were performed by the PA/NP/CNM/OB Fellow under my supervision/collaboration. Chart reviewed and agree with management and plan.  Kellie BurrowJohn V [name] 03/15/2014 10:00 PM

## 2014-03-15 NOTE — Progress Notes (Signed)
Pt. To get ambien with her scheduled meds

## 2014-03-16 MED ORDER — SUMATRIPTAN SUCCINATE 25 MG PO TABS
25.0000 mg | ORAL_TABLET | ORAL | Status: DC | PRN
  Administered 2014-03-16: 25 mg via ORAL
  Filled 2014-03-16: qty 1

## 2014-03-16 NOTE — Discharge Summary (Signed)
Attestation of Attending Supervision of Advanced Practitioner (CNM/NP): Evaluation and management procedures were performed by the Advanced Practitioner under my supervision and collaboration.  I have reviewed the Advanced Practitioner's note and chart, and I agree with the management and plan.  Derius Ghosh 03/16/2014 6:57 PM

## 2014-03-16 NOTE — Discharge Instructions (Signed)
Migraine Headache A migraine headache is an intense, throbbing pain on one or both sides of your head. A migraine can last for 30 minutes to several hours. CAUSES  The exact cause of a migraine headache is not always known. However, a migraine may be caused when nerves in the brain become irritated and release chemicals that cause inflammation. This causes pain. Certain things may also trigger migraines, such as:  Alcohol.  Smoking.  Stress.  Menstruation.  Aged cheeses.  Foods or drinks that contain nitrates, glutamate, aspartame, or tyramine.  Lack of sleep.  Chocolate.  Caffeine.  Hunger.  Physical exertion.  Fatigue.  Medicines used to treat chest pain (nitroglycerine), birth control pills, estrogen, and some blood pressure medicines. SIGNS AND SYMPTOMS  Pain on one or both sides of your head.  Pulsating or throbbing pain.  Severe pain that prevents daily activities.  Pain that is aggravated by any physical activity.  Nausea, vomiting, or both.  Dizziness.  Pain with exposure to bright lights, loud noises, or activity.  General sensitivity to bright lights, loud noises, or smells. Before you get a migraine, you may get warning signs that a migraine is coming (aura). An aura may include:  Seeing flashing lights.  Seeing bright spots, halos, or zig-zag lines.  Having tunnel vision or blurred vision.  Having feelings of numbness or tingling.  Having trouble talking.  Having muscle weakness. DIAGNOSIS  A migraine headache is often diagnosed based on:  Symptoms.  Physical exam.  A CT scan or MRI of your head. These imaging tests cannot diagnose migraines, but they can help rule out other causes of headaches. TREATMENT Medicines may be given for pain and nausea. Medicines can also be given to help prevent recurrent migraines.  HOME CARE INSTRUCTIONS  Only take over-the-counter or prescription medicines for pain or discomfort as directed by your  health care provider. The use of long-term narcotics is not recommended.  Lie down in a dark, quiet room when you have a migraine.  Keep a journal to find out what may trigger your migraine headaches. For example, write down:  What you eat and drink.  How much sleep you get.  Any change to your diet or medicines.  Limit alcohol consumption.  Quit smoking if you smoke.  Get 7 9 hours of sleep, or as recommended by your health care provider.  Limit stress.  Keep lights dim if bright lights bother you and make your migraines worse. SEEK IMMEDIATE MEDICAL CARE IF:   Your migraine becomes severe.  You have a fever.  You have a stiff neck.  You have vision loss.  You have muscular weakness or loss of muscle control.  You start losing your balance or have trouble walking.  You feel faint or pass out.  You have severe symptoms that are different from your first symptoms. MAKE SURE YOU:   Understand these instructions.  Will watch your condition.  Will get help right away if you are not doing well or get worse. Document Released: 11/02/2005 Document Revised: 08/23/2013 Document Reviewed: 07/10/2013 Exit[employer] Patient Information 2014 [employer].  

## 2014-03-16 NOTE — Discharge Summary (Signed)
Physician Discharge Summary  Patient ID: Kellie Deleon MRN: [id number] DOB/AGE: August 28, 1991 23 y.o.  Admit date: 03/14/2014 Discharge date: 03/16/2014  Admission Diagnoses: postpartum pre-eclampsia  Discharge Diagnoses:  Principal Problem:   Hypertension in pregnancy, preeclampsia, severe, postpartum condition Active Problems:   S/P vaginal delivery   Discharged Condition: fair  Hospital Course: [name] is a 23 yo G1P1 who presented on PPD#4 with headache, blurred vision and BP of >160/100.  For complete details of admission please see H&P. She was admitted to the ICU and started on Magnesium. On day 1 she developed some acute SOB and possible pulmonary edema and a CT PE study was done that was negative.  She was diuresed with improvement in her symptoms. She was transitioned off magnesium and her pressure returned to baseline. Her SOB resolved. Her HA resolved with a triptan x1.  She was discharged in stable condition with f/u next week in clinic for a BP check.    Significant Diagnostic Studies: CT PE study- neg  Treatments: see above   Discharge Exam: Blood pressure 129/81, pulse 72, temperature 99.1 F (37.3 C), temperature source Oral, resp. rate 16, height 5\' 3"  (1.6 m), weight 99.111 kg (218 lb 8 oz), SpO2 93.00%, unknown if currently breastfeeding. General appearance: alert, cooperative and no distress Head: Normocephalic, without obvious abnormality, atraumatic Resp: clear to auscultation bilaterally Cardio: regular rate and rhythm, S1, S2 normal, no murmur, click, rub or gallop GI: soft, non-tender; bowel sounds normal; no masses,  no organomegaly Extremities: extremities normal, atraumatic, no cyanosis or edema Pulses: 2+ and symmetric Neurologic: Reflexes: 2+ and symmetric  Disposition: 01-Home or Self Care  Discharge Orders   Future Appointments Provider Department Dept Phone   04/16/2014 2:15 PM Catalina AntiguaPeggy Constant, MD [facility] [phone]   Future  Orders Complete By Expires   Discharge patient  As directed        Medication List    STOP taking these medications       promethazine 25 MG tablet  Commonly known as:  PHENERGAN      TAKE these medications       acetaminophen 500 MG tablet  Commonly known as:  TYLENOL  Take 1,000 mg by mouth every 6 (six) hours as needed for pain.     ibuprofen 600 MG tablet  Commonly known as:  ADVIL,MOTRIN  Take 1 tablet (600 mg total) by mouth every 6 (six) hours.     prenatal multivitamin Tabs tablet  Take 1 tablet by mouth daily at 12 noon.           Follow-up Information   [facility] In 3 days. (for blood pressure check)    Specialty:  Obstetrics and Gynecology   Contact information:   [address] [place] [id number] [phone]      Signed: Vale HavenKeli L Henrine Hayter 03/16/2014, 4:54 PM

## 2014-03-19 ENCOUNTER — Ambulatory Visit: Payer: Medicaid Other | Admitting: *Deleted

## 2014-03-19 VITALS — BP 127/82 | HR 80

## 2014-03-19 DIAGNOSIS — M542 Cervicalgia: Secondary | ICD-10-CM

## 2014-03-19 MED ORDER — CYCLOBENZAPRINE HCL 5 MG PO TABS: 5.0000 mg | ORAL_TABLET | Freq: Three times a day (TID) | ORAL | Status: DC | PRN

## 2014-03-19 NOTE — Progress Notes (Signed)
BP WNL after second take when pt. Had been sitting/resting for 5 minutes. Pt. C/o of left neck and arm soreness/muscle tightness. Consulted with Dr. Debroah LoopArnold who states he will prescribe Flexeril for pt. Pt. Informed and advised to call if medication does not help within the next week. Pt. States she will continue to check BPs at home. Discussed elevated BP, to go to MAU if develops severe headache, SOB, chest pain or abdominal pain.

## 2014-03-20 ENCOUNTER — Inpatient Hospital Stay (HOSPITAL_COMMUNITY)
Admission: AD | Admit: 2014-03-20 | Discharge: 2014-03-21 | Disposition: A | Discharge status: Home / Self Care | Payer: Medicaid Other | Source: Ambulatory Visit | Attending: Obstetrics & Gynecology | Admitting: Obstetrics & Gynecology

## 2014-03-20 DIAGNOSIS — Z87891 Personal history of nicotine dependence: Secondary | ICD-10-CM | POA: Insufficient documentation

## 2014-03-20 DIAGNOSIS — O165 Unspecified maternal hypertension, complicating the puerperium: Secondary | ICD-10-CM

## 2014-03-20 DIAGNOSIS — E282 Polycystic ovarian syndrome: Secondary | ICD-10-CM | POA: Insufficient documentation

## 2014-03-20 DIAGNOSIS — O135 Gestational [pregnancy-induced] hypertension without significant proteinuria, complicating the puerperium: Secondary | ICD-10-CM | POA: Insufficient documentation

## 2014-03-21 ENCOUNTER — Encounter (HOSPITAL_COMMUNITY): Payer: Self-pay | Admitting: *Deleted

## 2014-03-21 DIAGNOSIS — O165 Unspecified maternal hypertension, complicating the puerperium: Secondary | ICD-10-CM

## 2014-03-21 LAB — URINALYSIS, ROUTINE W REFLEX MICROSCOPIC
BILIRUBIN URINE: NEGATIVE
Glucose, UA: NEGATIVE mg/dL
Hgb urine dipstick: NEGATIVE
Ketones, ur: NEGATIVE mg/dL
LEUKOCYTES UA: NEGATIVE
Nitrite: NEGATIVE
PH: 6.5 (ref 5.0–8.0)
Protein, ur: NEGATIVE mg/dL
Specific Gravity, Urine: 1.02 (ref 1.005–1.030)
Urobilinogen, UA: 0.2 mg/dL (ref 0.0–1.0)

## 2014-03-21 LAB — COMPREHENSIVE METABOLIC PANEL
ALT: 21 U/L (ref 0–35)
AST: 16 U/L (ref 0–37)
Albumin: 3.4 g/dL — ABNORMAL LOW (ref 3.5–5.2)
Alkaline Phosphatase: 108 U/L (ref 39–117)
BUN: 10 mg/dL (ref 6–23)
CO2: 23 mEq/L (ref 19–32)
CREATININE: 0.69 mg/dL (ref 0.50–1.10)
Calcium: 9.4 mg/dL (ref 8.4–10.5)
Chloride: 100 mEq/L (ref 96–112)
GFR calc Af Amer: >90 mL/min (ref >90)
GFR calc non Af Amer: >90 mL/min (ref >90)
GLUCOSE: 90 mg/dL (ref 70–99)
Potassium: 4.2 mEq/L (ref 3.7–5.3)
SODIUM: 137 meq/L (ref 137–147)
Total Bilirubin: 0.4 mg/dL (ref 0.3–1.2)
Total Protein: 6.5 g/dL (ref 6.0–8.3)

## 2014-03-21 LAB — CBC
HCT: 40.3 % (ref 36.0–46.0)
HEMOGLOBIN: 13.6 g/dL (ref 12.0–15.0)
MCH: 31.5 pg (ref 26.0–34.0)
MCHC: 33.7 g/dL (ref 30.0–36.0)
MCV: 93.3 fL (ref 78.0–100.0)
PLATELETS: 267 10*3/uL (ref 150–400)
RBC: 4.32 MIL/uL (ref 3.87–5.11)
RDW: 12.3 % (ref 11.5–15.5)
WBC: 8.2 10*3/uL (ref 4.0–10.5)

## 2014-03-21 LAB — PROTEIN / CREATININE RATIO, URINE
Creatinine, Urine: 88.57 mg/dL
PROTEIN CREATININE RATIO: 0.1 (ref 0.00–0.15)
Total Protein, Urine: 9 mg/dL

## 2014-03-21 MED ORDER — AMLODIPINE BESYLATE 5 MG PO TABS
5.0000 mg | ORAL_TABLET | ORAL | Status: AC
  Administered 2014-03-21: 5 mg via ORAL
  Filled 2014-03-21: qty 1

## 2014-03-21 MED ORDER — PRENATAL MULTIVITAMIN CH: 1.0000 | ORAL_TABLET | Freq: Every day | ORAL | Status: DC

## 2014-03-21 MED ORDER — AMLODIPINE BESYLATE 5 MG PO TABS: 5.0000 mg | ORAL_TABLET | Freq: Every day | ORAL | Status: DC

## 2014-03-21 NOTE — MAU Provider Note (Signed)
Attestation of Attending Supervision of Advanced Practitioner (CNM/NP): Evaluation and management procedures were performed by the Advanced Practitioner under my supervision and collaboration.  I have reviewed the Advanced Practitioner's note and chart, and I agree with the management and plan.  Ronrico Dupin Harraway-Smith 7:31 AM     

## 2014-03-21 NOTE — MAU Note (Signed)
Pt states she felt light headed and had blurred vision. [facility].

## 2014-03-21 NOTE — Discharge Instructions (Signed)

## 2014-03-21 NOTE — Progress Notes (Signed)
Discomfort  On movement to left

## 2014-03-21 NOTE — MAU Provider Note (Signed)
Chief Complaint: Hypertension   None   SUBJECTIVE HPI: Kellie Deleon is a 23 y.o. G1P1001 on PP Day 11 following NSVD presents to MAU reporting elevated BP, headache, and blurred vision today.  She was admitted to the hospital on 4/29, 2 days after her discharge following delivery, and received 24 hours of magnesium sulfate.  She was in [employer] today, reporting feeling lightheaded with blurred vision, and took her blood pressure, which was 150s/100s.   She denies epigastric pain.  She reports her headache is improved when lights reduced in MAU room.  She reports light lochia, and denies vaginal itching/burning, urinary symptoms, dizziness, n/v, or fever/chills.    Past Medical History  Diagnosis Date  . No significant past medical history   . Loss of teeth due to extraction   . PCOS (polycystic ovarian syndrome)   . Anxiety   . Headache(784.0)   . Bronchitis    Past Surgical History  Procedure Laterality Date  . Mouth surgery     History   Social History  . Marital Status: Married    Spouse Name: N/A    Number of Children: N/A  . Years of Education: N/A   Occupational History  . Not on file.   Social History Main Topics  . Smoking status: Former Smoker    Quit date: 10/27/2012  . Smokeless tobacco: Never Used  . Alcohol Use: No     Comment: Occassional Use  . Drug Use: No     Comment: Marijuana use in 2013  . Sexual Activity: Yes    Birth Control/ Protection: None   Other Topics Concern  . Not on file   Social History Narrative  . No narrative on file   No current facility-administered medications on file prior to encounter.   Current Outpatient Prescriptions on File Prior to Encounter  Medication Sig Dispense Refill  . acetaminophen (TYLENOL) 500 MG tablet Take 1,000 mg by mouth every 6 (six) hours as needed for pain.      . cyclobenzaprine (FLEXERIL) 5 MG tablet Take 1 tablet (5 mg total) by mouth every 8 (eight) hours as needed for muscle spasms.  30  tablet  1  . ibuprofen (ADVIL,MOTRIN) 600 MG tablet Take 1 tablet (600 mg total) by mouth every 6 (six) hours.  30 tablet  0  . Prenatal Vit-Fe Fumarate-FA (PRENATAL MULTIVITAMIN) TABS tablet Take 1 tablet by mouth daily at 12 noon.       Allergies  Allergen Reactions  . Latex Rash and Other (See Comments)    Burning     ROS: Pertinent items in HPI  OBJECTIVE Blood pressure 140/98, pulse 70, currently breastfeeding. GENERAL: Well-developed, well-nourished female in no acute distress.  HEENT: Normocephalic HEART: normal rate RESP: normal effort ABDOMEN: Soft, non-tender EXTREMITIES: Nontender, no edema NEURO: Alert and oriented   LAB RESULTS Results for orders placed during the hospital encounter of 03/20/14 (from the past 24 hour(s))  CBC     Status: None   Collection Time    03/21/14 12:50 AM      Result Value Ref Range   WBC 8.2  4.0 - 10.5 K/uL   RBC 4.32  3.87 - 5.11 MIL/uL   Hemoglobin 13.6  12.0 - 15.0 g/dL   HCT 16.1  09.6 - 04.5 %   MCV 93.3  78.0 - 100.0 fL   MCH 31.5  26.0 - 34.0 pg   MCHC 33.7  30.0 - 36.0 g/dL   RDW 40.9  81.1 -  15.5 %   Platelets 267  150 - 400 K/uL  COMPREHENSIVE METABOLIC PANEL     Status: Abnormal   Collection Time    03/21/14 12:50 AM      Result Value Ref Range   Sodium 137  137 - 147 mEq/L   Potassium 4.2  3.7 - 5.3 mEq/L   Chloride 100  96 - 112 mEq/L   CO2 23  19 - 32 mEq/L   Glucose, Bld 90  70 - 99 mg/dL   BUN 10  6 - 23 mg/dL   Creatinine, Ser 1.610.69  0.50 - 1.10 mg/dL   Calcium 9.4  8.4 - 09.610.5 mg/dL   Total Protein 6.5  6.0 - 8.3 g/dL   Albumin 3.4 (*) 3.5 - 5.2 g/dL   AST 16  0 - 37 U/L   ALT 21  0 - 35 U/L   Alkaline Phosphatase 108  39 - 117 U/L   Total Bilirubin 0.4  0.3 - 1.2 mg/dL   GFR calc non Af Amer >90  >90 mL/min   GFR calc Af Amer >90  >90 mL/min  URINALYSIS, ROUTINE W REFLEX MICROSCOPIC     Status: None   Collection Time    03/21/14  1:30 AM      Result Value Ref Range   Color, Urine YELLOW  YELLOW    APPearance CLEAR  CLEAR   Specific Gravity, Urine 1.020  1.005 - 1.030   pH 6.5  5.0 - 8.0   Glucose, UA NEGATIVE  NEGATIVE mg/dL   Hgb urine dipstick NEGATIVE  NEGATIVE   Bilirubin Urine NEGATIVE  NEGATIVE   Ketones, ur NEGATIVE  NEGATIVE mg/dL   Protein, ur NEGATIVE  NEGATIVE mg/dL   Urobilinogen, UA 0.2  0.0 - 1.0 mg/dL   Nitrite NEGATIVE  NEGATIVE   Leukocytes, UA NEGATIVE  NEGATIVE  PROTEIN / CREATININE RATIO, URINE     Status: None   Collection Time    03/21/14  1:30 AM      Result Value Ref Range   Creatinine, Urine 88.57     Total Protein, Urine 9     PROTEIN CREATININE RATIO 0.10  0.00 - 0.15     ASSESSMENT 1. Hypertension in pregnancy, postpartum condition     PLAN Consult Dr Erin FullingHarraway-Smith Discharge home Start Norvasc 5 mg daily, first dose in MAU [facility] Increase PO fluids Return to MAU as needed for emergencies    Medication List         acetaminophen 500 MG tablet  Commonly known as:  TYLENOL  Take 1,000 mg by mouth every 6 (six) hours as needed for pain.     amLODipine 5 MG tablet  Commonly known as:  NORVASC  Take 1 tablet (5 mg total) by mouth daily.     cyclobenzaprine 5 MG tablet  Commonly known as:  FLEXERIL  Take 1 tablet (5 mg total) by mouth every 8 (eight) hours as needed for muscle spasms.     ibuprofen 600 MG tablet  Commonly known as:  ADVIL,MOTRIN  Take 1 tablet (600 mg total) by mouth every 6 (six) hours.     prenatal multivitamin Tabs tablet  Take 1 tablet by mouth daily at 12 noon.       Follow-up Information   Call [facility]. (Call clinic for BP check in 1 week.  Return to MAU as needed for emergencies.)    Specialty:  Obstetrics and Gynecology  Contact information:   [address] [place] [id number] [phone]      Sharen CounterLisa Leftwich-Kirby Certified Nurse-Midwife 03/21/2014  3:15 AM

## 2014-03-26 ENCOUNTER — Ambulatory Visit (INDEPENDENT_AMBULATORY_CARE_PROVIDER_SITE_OTHER): Payer: Medicaid Other

## 2014-03-26 VITALS — BP 124/89 | HR 78

## 2014-03-26 DIAGNOSIS — O1415 Severe pre-eclampsia, complicating the puerperium: Secondary | ICD-10-CM

## 2014-03-26 NOTE — Progress Notes (Signed)
Pt came in for BP check.  Delivered from 03/10/14 due to preecamplsia.  Pt started on Novarsc, pt taken as prescribed.  Per Dr. Ike Benedom, pt is fine, continue taking medication as prescribed and follow up on her PP visit on 04/16/14.  Pt agreed.

## 2014-04-16 ENCOUNTER — Encounter: Payer: Self-pay | Admitting: Family Medicine

## 2014-04-16 ENCOUNTER — Ambulatory Visit (INDEPENDENT_AMBULATORY_CARE_PROVIDER_SITE_OTHER): Payer: Medicaid Other | Admitting: Family Medicine

## 2014-04-16 MED ORDER — NORETHIN ACE-ETH ESTRAD-FE 1-20 MG-MCG(24) PO TABS: 1.0000 | ORAL_TABLET | Freq: Every day | ORAL | Status: DC

## 2014-04-16 NOTE — Progress Notes (Signed)
Would like birth control pills. Is breastfeeding.

## 2014-04-16 NOTE — Progress Notes (Signed)
Patient ID: Kellie Deleon, female   DOB: 26-May-1991, 23 y.o.   MRN: [id number] Subjective:    Kellie Deleon is a 23 y.o. G4P1001 African American female who presents for a postpartum visit. She is 4 weeks postpartum following a spontaneous vaginal delivery. She was induced for gHTN and then admitted on ppd #4 for postpartum pre-eclampsia.  I have fully reviewed the prenatal and intrapartum course. The delivery was at 37 gestational weeks. Outcome: spontaneous vaginal delivery. Anesthesia: epidural. Postpartum course has been uncomplicated after initial hospitalization for pre-E. Baby's course has been uncomplicated. Baby is feeding by breast. Having some trouble with supply because she has stopped pumping regularly. Baby doesn't latch well. Bleeding no bleeding. Bowel function is normal. Bladder function is normal. Patient is sexually active- no complications. Contraception method is OCP (estrogen/progesterone). Postpartum depression screening: negative.  The following portions of the patient's history were reviewed and updated as appropriate: allergies, current medications, past medical history, past surgical history and problem list.  Review of Systems Pertinent items are noted in HPI.   Filed Vitals:   04/16/14 1405  BP: 124/80  Pulse: 88  Height: 5\' 3"  (1.6 m)  Weight: 210 lb (95.255 kg)    Objective:     General:  alert, cooperative and no distress   Breasts:  deferred, no complaints  Lungs: clear to auscultation bilaterally  Heart:  regular rate and rhythm  Abdomen: soft, nontender   Vulva: normal  Vagina: normal vagina  Cervix:  closed  Corpus: Well-involuted  Adnexa:  Non-palpable  Rectal Exam: no hemorrhoids        Assessment:   normal postpartum exam  5 wks s/p NSVD Depression screening Contraception counseling   Plan:   Contraception: OCP (estrogen/progesterone) Follow up in: 1 weeks for BP check or as needed.   1) stop Norvasc as normal pressures and  asymptomatic- f/u next week for BP check 2) lo-estrin prescribed but advised to wait until 6wk pp. Abstain until then.  3) f/u here in 1 year for routine gyn care.  4) discussed using lactation assistance at [facility] and that the less she pumps, the more her supply will dry up.   Vale Haven, MD

## 2014-04-16 NOTE — Patient Instructions (Signed)
Ethinyl Estradiol; Norethindrone Acetate tablets (contraception) What is this medicine? ETHINYL ESTRADIOL; NORETHINDRONE ACETATE (ETH in il es tra DYE ole; nor eth IN drone AS e tate) is an oral contraceptive. The products combine two types of female hormones, an estrogen and a progestin. They are used to prevent ovulation and pregnancy. This medicine may be used for other purposes; ask your health care provider or pharmacist if you have questions. COMMON BRAND NAME(S): Estrostep Fe, Gildess Fe 1.5/30, Gildess Fe 1/20, Gildess, Junel 1.5/30, Junel 1/20, Junel Fe 1.5/30, Junel Fe 1/20, Larin Fe, Granite, Lo Loestrin Fe, Loestrin 1.5/30, Loestrin 1/20, Loestrin 24 Fe, Loestrin FE 1.5/30, Loestrin FE 1/20, Lomedia 24 Fe, Microgestin 1.5/30, Microgestin 1/20, Microgestin Fe 1.5/30, Microgestin Fe 1/20, Tilia Fe, Tri-Legest Fe What should I tell my health care provider before I take this medicine? They need to know if you have or ever had any of these conditions: -abnormal vaginal bleeding -blood vessel disease or blood clots -breast, cervical, endometrial, ovarian, liver, or uterine cancer -diabetes -gallbladder disease -heart disease or recent heart attack -high blood pressure -high cholesterol -kidney disease -liver disease -migraine headaches -stroke -systemic lupus erythematosus (SLE) -tobacco smoker -an unusual or allergic reaction to estrogens, progestins, other medicines, foods, dyes, or preservatives -pregnant or trying to get pregnant -breast-feeding How should I use this medicine? Take this medicine by mouth. To reduce nausea, this medicine may be taken with food. Follow the directions on the prescription label. Take this medicine at the same time each day and in the order directed on the package. Do not take your medicine more often than directed. Contact your pediatrician regarding the use of this medicine in children. Special care may be needed. This medicine has been used in female  children who have started having menstrual periods. A patient package insert for the product will be given with each prescription and refill. Read this sheet carefully each time. The sheet may change frequently. Overdosage: If you think you have taken too much of this medicine contact a poison control center or emergency room at once. NOTE: This medicine is only for you. Do not share this medicine with others. What if I miss a dose? If you miss a dose, refer to the patient information sheet you received with your medicine for direction. If you miss more than one pill, this medicine may not be as effective and you may need to use another form of birth control. What may interact with this medicine? -acetaminophen -antibiotics or medicines for infections, especially rifampin, rifabutin, rifapentine, and griseofulvin, and possibly penicillins or tetracyclines -aprepitant -ascorbic acid (vitamin C) -atorvastatin -barbiturate medicines, such as phenobarbital -bosentan -carbamazepine -caffeine -clofibrate -cyclosporine -dantrolene -doxercalciferol -felbamate -grapefruit juice -hydrocortisone -medicines for anxiety or sleeping problems, such as diazepam or temazepam -medicines for diabetes, including pioglitazone -mineral oil -modafinil -mycophenolate -nefazodone -oxcarbazepine -phenytoin -prednisolone -ritonavir or other medicines for HIV infection or AIDS -rosuvastatin -selegiline -soy isoflavones supplements -St. John's wort -tamoxifen or raloxifene -theophylline -thyroid hormones -topiramate -warfarin This list may not describe all possible interactions. Give your health care provider a list of all the medicines, herbs, non-prescription drugs, or dietary supplements you use. Also tell them if you smoke, drink alcohol, or use illegal drugs. Some items may interact with your medicine. What should I watch for while using this medicine? Visit your doctor or health care  professional for regular checks on your progress. You will need a regular breast and pelvic exam and Pap smear while on this medicine. Use an additional  method of contraception during the first cycle that you take these tablets. If you have any reason to think you are pregnant, stop taking this medicine right away and contact your doctor or health care professional. If you are taking this medicine for hormone related problems, it may take several cycles of use to see improvement in your condition. Smoking increases the risk of getting a blood clot or having a stroke while you are taking birth control pills, especially if you are more than 24 years old. You are strongly advised not to smoke. This medicine can make your body retain fluid, making your fingers, hands, or ankles swell. Your blood pressure can go up. Contact your doctor or health care professional if you feel you are retaining fluid. This medicine can make you more sensitive to the sun. Keep out of the sun. If you cannot avoid being in the sun, wear protective clothing and use sunscreen. Do not use sun lamps or tanning beds/booths. If you wear contact lenses and notice visual changes, or if the lenses begin to feel uncomfortable, consult your eye care specialist. In some women, tenderness, swelling, or minor bleeding of the gums may occur. Notify your dentist if this happens. Brushing and flossing your teeth regularly may help limit this. See your dentist regularly and inform your dentist of the medicines you are taking. If you are going to have elective surgery, you may need to stop taking this medicine before the surgery. Consult your health care professional for advice. This medicine does not protect you against HIV infection (AIDS) or any other sexually transmitted diseases. What side effects may I notice from receiving this medicine? Side effects that you should report to your doctor or health care professional as soon as  possible: -breast tissue changes or discharge -changes in vaginal bleeding during your period or between your periods -chest pain -coughing up blood -dizziness or fainting spells -headaches or migraines -leg, arm or groin pain -severe or sudden headaches -stomach pain (severe) -sudden shortness of breath -sudden loss of coordination, especially on one side of the body -speech problems -symptoms of vaginal infection like itching, irritation or unusual discharge -tenderness in the upper abdomen -vomiting -weakness or numbness in the arms or legs, especially on one side of the body -yellowing of the eyes or skin Side effects that usually do not require medical attention (report to your doctor or health care professional if they continue or are bothersome): -breakthrough bleeding and spotting that continues beyond the 3 initial cycles of pills -breast tenderness -mood changes, anxiety, depression, frustration, anger, or emotional outbursts -increased sensitivity to sun or ultraviolet light -nausea -skin rash, acne, or brown spots on the skin -weight gain (slight) This list may not describe all possible side effects. Call your doctor for medical advice about side effects. You may report side effects to FDA at 1-800-FDA-1088. Where should I keep my medicine? Keep out of the reach of children. Store at room temperature between 15 and 30 degrees C (59 and 86 degrees F). Throw away any unused medicine after the expiration date. NOTE: This sheet is a summary. It may not cover all possible information. If you have questions about this medicine, talk to your doctor, pharmacist, or health care provider.  2014, Elsevier/Gold Standard. (2013-03-10 15:35:20)

## 2014-05-02 ENCOUNTER — Ambulatory Visit: Payer: Medicaid Other | Admitting: Obstetrics and Gynecology

## 2014-05-02 VITALS — BP 117/73 | HR 79

## 2014-05-02 DIAGNOSIS — B373 Candidiasis of vulva and vagina: Secondary | ICD-10-CM

## 2014-05-02 DIAGNOSIS — B3731 Acute candidiasis of vulva and vagina: Secondary | ICD-10-CM

## 2014-05-02 MED ORDER — FLUCONAZOLE 150 MG PO TABS: 150.0000 mg | ORAL_TABLET | Freq: Once | ORAL | Status: DC

## 2014-05-02 NOTE — Progress Notes (Signed)
Pt here for BP check only. BP WNL. C/o vaginal itch with yellow discharge- prescribed standing order diflucan per protocol. Patient agrees and satisfied.

## 2014-09-17 ENCOUNTER — Encounter: Payer: Self-pay | Admitting: Family Medicine

## 2014-10-01 ENCOUNTER — Emergency Department (HOSPITAL_BASED_OUTPATIENT_CLINIC_OR_DEPARTMENT_OTHER): Payer: Medicaid Other

## 2014-10-01 ENCOUNTER — Emergency Department (HOSPITAL_BASED_OUTPATIENT_CLINIC_OR_DEPARTMENT_OTHER)
Admission: EM | Admit: 2014-10-01 | Discharge: 2014-10-01 | Disposition: A | Discharge status: Home / Self Care | Payer: Medicaid Other | Attending: Emergency Medicine | Admitting: Emergency Medicine

## 2014-10-01 ENCOUNTER — Encounter (HOSPITAL_BASED_OUTPATIENT_CLINIC_OR_DEPARTMENT_OTHER): Payer: Self-pay | Admitting: *Deleted

## 2014-10-01 DIAGNOSIS — Z91040 Latex allergy status: Secondary | ICD-10-CM | POA: Insufficient documentation

## 2014-10-01 DIAGNOSIS — R0602 Shortness of breath: Secondary | ICD-10-CM | POA: Insufficient documentation

## 2014-10-01 DIAGNOSIS — Z3202 Encounter for pregnancy test, result negative: Secondary | ICD-10-CM | POA: Insufficient documentation

## 2014-10-01 DIAGNOSIS — Z8659 Personal history of other mental and behavioral disorders: Secondary | ICD-10-CM | POA: Insufficient documentation

## 2014-10-01 DIAGNOSIS — Z8709 Personal history of other diseases of the respiratory system: Secondary | ICD-10-CM | POA: Insufficient documentation

## 2014-10-01 DIAGNOSIS — Z79899 Other long term (current) drug therapy: Secondary | ICD-10-CM | POA: Insufficient documentation

## 2014-10-01 DIAGNOSIS — R110 Nausea: Secondary | ICD-10-CM | POA: Insufficient documentation

## 2014-10-01 DIAGNOSIS — Z8742 Personal history of other diseases of the female genital tract: Secondary | ICD-10-CM | POA: Insufficient documentation

## 2014-10-01 DIAGNOSIS — Z87891 Personal history of nicotine dependence: Secondary | ICD-10-CM | POA: Insufficient documentation

## 2014-10-01 DIAGNOSIS — K08409 Partial loss of teeth, unspecified cause, unspecified class: Secondary | ICD-10-CM | POA: Insufficient documentation

## 2014-10-01 DIAGNOSIS — R0789 Other chest pain: Secondary | ICD-10-CM

## 2014-10-01 DIAGNOSIS — R079 Chest pain, unspecified: Secondary | ICD-10-CM | POA: Insufficient documentation

## 2014-10-01 LAB — PREGNANCY, URINE: Preg Test, Ur: NEGATIVE

## 2014-10-01 NOTE — ED Provider Notes (Signed)
CSN: [id number]     Arrival date & time 10/01/14  [id number] History   First MD Initiated Contact with Patient 10/01/14 [phone]     Chief Complaint  Patient presents with  . Pleurisy     Patient is a 23 y.o. female presenting with chest pain. The history is provided by the patient.  Chest Pain Pain location:  Substernal area Pain quality: pressure   Pain radiates to:  Epigastrium Pain radiates to the back: no   Pain severity:  Moderate Onset quality:  Gradual Duration:  8 hours Timing:  Intermittent Progression:  Unchanged Chronicity:  New Relieved by:  None tried Worsened by:  Deep breathing and movement Associated symptoms: nausea and shortness of breath   Associated symptoms: no cough, no fever, no lower extremity edema, not vomiting and no weakness   Risk factors: no birth control, no coronary artery disease, no immobilization, not pregnant, no prior DVT/PE, no smoking and no surgery   Patient reports waking up with Substernal CP that worsens with deep breathing No cough/fever/hemoptysis She reports mild SOB She has never had this before She reports pain is also worse with movement in bed No LE edema She is not currently on oral contraceptives (stoppped one month ago) No recent travel/surgery  No h/o PE/DVT per patient  Past Medical History  Diagnosis Date  . No significant past medical history   . Loss of teeth due to extraction   . PCOS (polycystic ovarian syndrome)   . Anxiety   . Headache(784.0)   . Bronchitis    Past Surgical History  Procedure Laterality Date  . Mouth surgery     Family History  Problem Relation Age of Onset  . Cancer Other   . Hypertension Brother    History  Substance Use Topics  . Smoking status: Former Smoker    Quit date: 10/27/2012  . Smokeless tobacco: Never Used  . Alcohol Use: No     Comment: Occassional Use   OB History    Gravida Para Term Preterm AB TAB SAB Ectopic Multiple Living   1 1 1  0 0 0 0 0 0 1     Review of  Systems  Constitutional: Negative for fever.  Respiratory: Positive for shortness of breath. Negative for cough.   Cardiovascular: Positive for chest pain. Negative for leg swelling.  Gastrointestinal: Positive for nausea. Negative for vomiting.  Genitourinary: Negative for vaginal bleeding.  Neurological: Negative for weakness.  All other systems reviewed and are negative.     Allergies  Latex  Home Medications   Prior to Admission medications   Medication Sig Start Date End Date Taking? Authorizing Provider  Prenatal Vit-Fe Fumarate-FA (PRENATAL MULTIVITAMIN) TABS tablet Take 1 tablet by mouth daily at 12 noon. 03/21/14   Wilmer FloorLisa A Leftwich-Kirby, CNM   BP 124/89 mmHg  Pulse 95  Temp(Src) 98.7 F (37.1 C) (Oral)  Resp 18  Ht 5\' 5"  (1.651 m)  Wt 217 lb 5 oz (98.572 kg)  BMI 36.16 kg/m2  Breastfeeding? No Physical Exam CONSTITUTIONAL: Well developed/well nourished HEAD: Normocephalic/atraumatic EYES: EOMI/PERRL ENMT: Mucous membranes moist NECK: supple no meningeal signs SPINE/BACK:entire spine nontender CV: S1/S2 noted, no murmurs/rubs/gallops noted LUNGS: Lungs are clear to auscultation bilaterally, no apparent distress ABDOMEN: soft, nontender, no rebound or guarding, bowel sounds noted throughout abdomen GU:no cva tenderness NEURO: Pt is awake/alert/appropriate, moves all extremitiesx4.  No facial droop.   EXTREMITIES: pulses normal/equal, full ROM, no LE edema/tenderness SKIN: warm, color normal PSYCH: no abnormalities of  mood noted, alert and oriented to situation  ED Course  Procedures  Labs Review Labs Reviewed  PREGNANCY, URINE    Imaging Review Dg Chest 2 View  10/01/2014   CLINICAL DATA:  Chest pain of uncertain etiology.  EXAM: CHEST  2 VIEW  COMPARISON:  November 23, 2012.  FINDINGS: The heart size and mediastinal contours are within normal limits. Both lungs are clear. No pneumothorax or pleural effusion is noted. The visualized skeletal structures are  unremarkable.  IMPRESSION: No acute cardiopulmonary abnormality seen.   Electronically Signed   By: Roque LiasJames  Green M.D.   On: 10/01/2014 10:55   EKG:  Date: 10/01/2014 10:04am  Rate: 83  Rhythm: normal sinus rhythm  QRS Axis: normal  Intervals: normal  ST/T Wave abnormalities: t wave inversion noted  Conduction Disutrbances:none  Narrative Interpretation:   Old EKG Reviewed: unchanged from prior in MUSE    MDM  10:32 AM Pt with CP since last night EKG unchanged CXR pending PERC currently negative I doubt ACS/PE/Dissection at this time 11:14 AM Pt awake/alert,no distress, she is smiling and appears comfortable I feel she is appropriate/safe for discharge home  Final diagnoses:  Chest pain of uncertain etiology    Nursing notes including past medical history and social history reviewed and considered in documentation Labs/vital reviewed myself and considered during evaluation xrays/imaging reviewed by myself and considered during evaluation     Joya Gaskinsonald W Sedric Guia, MD 10/01/14 1115

## 2014-10-01 NOTE — ED Notes (Signed)
Pt reports chest pressure when taking breathing.  Increased pain with deep inspiration.  Denies long trips.  Pt reports that she may be pregnant, unknown LMP, stopped BC in October.

## 2014-10-01 NOTE — Discharge Instructions (Signed)

## 2014-11-16 NOTE — L&D Delivery Note (Signed)
Patient is 24 y.o. [id number] [redacted]w[redacted]d admitted PROM, hx of gDM that was diet controlled.   Delivery Note At 3:09 PM a viable female was delivered via Vaginal, Spontaneous Delivery (Presentation: Left Occiput Anterior).  APGAR: 9, 9; weight 7 lb 5.3 oz (3325 g).   Placenta status: Intact, Spontaneous.  Cord: 3 vessels with the following complications: None.  Cord pH: n/i  Anesthesia: Epidural  Episiotomy: None Lacerations: None, perineal abrasion 1200 Suture Repair: none Est. Blood Loss (mL): 75  Mom to postpartum.  Baby to Couplet care / Skin to Skin.  Durenda Hurt 07/24/2015, 3:54 PM  OB fellow attestation: Patient is a [id number] at [redacted]w[redacted]d who was admitted for PROM, significant hx of A1GDM.  She progressed with augmentation via pitocin.  I was gloved and present for delivery in its entirety.  Second stage of labor progressed, baby delivered after 3-4 contractions.  No decels during second stage noted. The fetal head delivered without difficulty but shoulders were slow to deliver with some retractions of fetal head at perineum. I personally delivered the shoulders and torso of the infant without the use of any maneuvers.   Complications: none  Lacerations: abrasion per above  EBL: 75  Federico Flake, MD 7:17 PM

## 2014-12-26 ENCOUNTER — Encounter: Payer: Self-pay | Admitting: *Deleted

## 2014-12-26 ENCOUNTER — Ambulatory Visit (INDEPENDENT_AMBULATORY_CARE_PROVIDER_SITE_OTHER): Payer: Medicaid Other | Admitting: *Deleted

## 2014-12-26 DIAGNOSIS — Z3201 Encounter for pregnancy test, result positive: Secondary | ICD-10-CM | POA: Diagnosis not present

## 2014-12-26 DIAGNOSIS — Z3490 Encounter for supervision of normal pregnancy, unspecified, unspecified trimester: Secondary | ICD-10-CM

## 2014-12-26 DIAGNOSIS — Z3200 Encounter for pregnancy test, result unknown: Secondary | ICD-10-CM

## 2014-12-26 LAB — POCT PREGNANCY, URINE: Preg Test, Ur: POSITIVE — AB

## 2014-12-26 MED ORDER — PRENATAL VITAMINS PLUS 27-1 MG PO TABS: 1.0000 | ORAL_TABLET | Freq: Every day | ORAL | Status: DC

## 2014-12-26 NOTE — Progress Notes (Signed)
Here today for pregnancy test, which was positive. Would like to get prenatal here. Will gets labs and urine test today.

## 2014-12-27 LAB — PRENATAL PROFILE (SOLSTAS)
ANTIBODY SCREEN: NEGATIVE
BASOS ABS: 0 10*3/uL (ref 0.0–0.1)
BASOS PCT: 0 % (ref 0–1)
EOS PCT: 1 % (ref 0–5)
Eosinophils Absolute: 0.1 10*3/uL (ref 0.0–0.7)
HCT: 40 % (ref 36.0–46.0)
HEMOGLOBIN: 13.6 g/dL (ref 12.0–15.0)
HIV: NONREACTIVE
Hepatitis B Surface Ag: NEGATIVE
Lymphocytes Relative: 17 % (ref 12–46)
Lymphs Abs: 1.7 10*3/uL (ref 0.7–4.0)
MCH: 30.4 pg (ref 26.0–34.0)
MCHC: 34 g/dL (ref 30.0–36.0)
MCV: 89.5 fL (ref 78.0–100.0)
MPV: 10.7 fL (ref 8.6–12.4)
Monocytes Absolute: 0.5 10*3/uL (ref 0.1–1.0)
Monocytes Relative: 5 % (ref 3–12)
NEUTROS ABS: 7.8 10*3/uL — AB (ref 1.7–7.7)
Neutrophils Relative %: 77 % (ref 43–77)
PLATELETS: 307 10*3/uL (ref 150–400)
RBC: 4.47 MIL/uL (ref 3.87–5.11)
RDW: 12.9 % (ref 11.5–15.5)
Rh Type: POSITIVE
Rubella: 5.9 Index — ABNORMAL HIGH (ref <0.90)
WBC: 10.1 10*3/uL (ref 4.0–10.5)

## 2014-12-27 LAB — PRESCRIPTION MONITORING PROFILE (19 PANEL)
Amphetamine/Meth: NEGATIVE ng/mL
BARBITURATE SCREEN, URINE: NEGATIVE ng/mL
BENZODIAZEPINE SCREEN, URINE: NEGATIVE ng/mL
Buprenorphine, Urine: NEGATIVE ng/mL
CARISOPRODOL, URINE: NEGATIVE ng/mL
Cannabinoid Scrn, Ur: NEGATIVE ng/mL
Cocaine Metabolites: NEGATIVE ng/mL
Creatinine, Urine: 487.68 mg/dL (ref >20.0)
Fentanyl, Ur: NEGATIVE ng/mL
MDMA URINE: NEGATIVE ng/mL
MEPERIDINE UR: NEGATIVE ng/mL
Methadone Screen, Urine: NEGATIVE ng/mL
Methaqualone: NEGATIVE ng/mL
NITRITES URINE, INITIAL: NEGATIVE ug/mL
OPIATE SCREEN, URINE: NEGATIVE ng/mL
Oxycodone Screen, Ur: NEGATIVE ng/mL
PH URINE, INITIAL: 6.1 pH (ref 4.5–8.9)
PROPOXYPHENE: NEGATIVE ng/mL
Phencyclidine, Ur: NEGATIVE ng/mL
TRAMADOL UR: NEGATIVE ng/mL
Tapentadol, urine: NEGATIVE ng/mL
ZOLPIDEM, URINE: NEGATIVE ng/mL

## 2014-12-28 ENCOUNTER — Other Ambulatory Visit: Payer: Self-pay | Admitting: Obstetrics & Gynecology

## 2014-12-28 ENCOUNTER — Ambulatory Visit (HOSPITAL_COMMUNITY)
Admission: RE | Admit: 2014-12-28 | Discharge: 2014-12-28 | Disposition: A | Discharge status: Home / Self Care | Payer: Medicaid Other | Source: Ambulatory Visit | Attending: Obstetrics & Gynecology | Admitting: Obstetrics & Gynecology

## 2014-12-28 DIAGNOSIS — Z3490 Encounter for supervision of normal pregnancy, unspecified, unspecified trimester: Secondary | ICD-10-CM

## 2014-12-28 DIAGNOSIS — O365910 Maternal care for other known or suspected poor fetal growth, first trimester, not applicable or unspecified: Secondary | ICD-10-CM | POA: Diagnosis present

## 2014-12-28 DIAGNOSIS — Z3A09 9 weeks gestation of pregnancy: Secondary | ICD-10-CM | POA: Diagnosis not present

## 2014-12-28 LAB — HEMOGLOBINOPATHY EVALUATION
HEMOGLOBIN OTHER: 0 %
HGB A2 QUANT: 2 % — AB (ref 2.2–3.2)
HGB S QUANTITAION: 0 %
Hgb A: 98 % — ABNORMAL HIGH (ref 96.8–97.8)
Hgb F Quant: 0 % (ref 0.0–2.0)

## 2014-12-28 NOTE — Progress Notes (Signed)
Pt. Informed of new guidelines for future US appointments: Two adults may accompany patient; no children under 25 years of age.  Patient expressed understanding.

## 2014-12-29 LAB — CULTURE, OB URINE

## 2015-01-02 ENCOUNTER — Encounter: Payer: Self-pay | Admitting: Obstetrics & Gynecology

## 2015-01-03 ENCOUNTER — Telehealth: Payer: Self-pay | Admitting: *Deleted

## 2015-01-07 NOTE — Telephone Encounter (Signed)
Had ultrasound confirming dates. Will schedule initial ob.

## 2015-01-29 ENCOUNTER — Encounter (HOSPITAL_COMMUNITY): Payer: Self-pay | Admitting: *Deleted

## 2015-01-29 ENCOUNTER — Inpatient Hospital Stay (HOSPITAL_COMMUNITY)
Admission: AD | Admit: 2015-01-29 | Discharge: 2015-01-29 | Disposition: A | Discharge status: Home / Self Care | Payer: Medicaid Other | Source: Ambulatory Visit | Attending: Obstetrics & Gynecology | Admitting: Obstetrics & Gynecology

## 2015-01-29 DIAGNOSIS — N949 Unspecified condition associated with female genital organs and menstrual cycle: Secondary | ICD-10-CM | POA: Insufficient documentation

## 2015-01-29 DIAGNOSIS — B372 Candidiasis of skin and nail: Secondary | ICD-10-CM | POA: Diagnosis not present

## 2015-01-29 DIAGNOSIS — Z87891 Personal history of nicotine dependence: Secondary | ICD-10-CM | POA: Diagnosis not present

## 2015-01-29 DIAGNOSIS — Z3A14 14 weeks gestation of pregnancy: Secondary | ICD-10-CM | POA: Diagnosis not present

## 2015-01-29 DIAGNOSIS — O98812 Other maternal infectious and parasitic diseases complicating pregnancy, second trimester: Secondary | ICD-10-CM | POA: Diagnosis not present

## 2015-01-29 DIAGNOSIS — B373 Candidiasis of vulva and vagina: Secondary | ICD-10-CM | POA: Diagnosis not present

## 2015-01-29 HISTORY — DX: Acute vaginitis: N76.0

## 2015-01-29 HISTORY — DX: Other specified bacterial agents as the cause of diseases classified elsewhere: B96.89

## 2015-01-29 LAB — URINALYSIS, ROUTINE W REFLEX MICROSCOPIC
Bilirubin Urine: NEGATIVE
Glucose, UA: NEGATIVE mg/dL
Hgb urine dipstick: NEGATIVE
Ketones, ur: NEGATIVE mg/dL
Leukocytes, UA: NEGATIVE
NITRITE: NEGATIVE
Protein, ur: NEGATIVE mg/dL
SPECIFIC GRAVITY, URINE: 1.025 (ref 1.005–1.030)
UROBILINOGEN UA: 0.2 mg/dL (ref 0.0–1.0)
pH: 6.5 (ref 5.0–8.0)

## 2015-01-29 LAB — WET PREP, GENITAL
Clue Cells Wet Prep HPF POC: NONE SEEN
Trich, Wet Prep: NONE SEEN
YEAST WET PREP: NONE SEEN

## 2015-01-29 MED ORDER — NYSTATIN 100000 UNIT/GM EX CREA: TOPICAL_CREAM | CUTANEOUS | Status: DC

## 2015-01-29 MED ORDER — TRIAMCINOLONE ACETONIDE 0.1 % EX CREA: 1.0000 "application " | TOPICAL_CREAM | Freq: Two times a day (BID) | CUTANEOUS | Status: DC

## 2015-01-29 NOTE — Discharge Instructions (Signed)
Cutaneous Candidiasis Cutaneous candidiasis is a condition in which there is an overgrowth of yeast (candida) on the skin. Yeast normally live on the skin, but in small enough numbers not to cause any symptoms. In certain cases, increased growth of the yeast may cause an actual yeast infection. This kind of infection usually occurs in areas of the skin that are constantly warm and moist, such as the armpits or the groin. Yeast is the most common cause of diaper rash in babies and in people who cannot control their bowel movements (incontinence). CAUSES  The fungus that most often causes cutaneous candidiasis is Candida albicans. Conditions that can increase the risk of getting a yeast infection of the skin include:  Obesity.  Pregnancy.  Diabetes.  Taking antibiotic medicine.  Taking birth control pills.  Taking steroid medicines.  Thyroid disease.  An iron or zinc deficiency.  Problems with the immune system. SYMPTOMS   Red, swollen area of the skin.  Bumps on the skin.  Itchiness. DIAGNOSIS  The diagnosis of cutaneous candidiasis is usually based on its appearance. Light scrapings of the skin may also be taken and viewed under a microscope to identify the presence of yeast. TREATMENT  Antifungal creams may be applied to the infected skin. In severe cases, oral medicines may be needed.  HOME CARE INSTRUCTIONS   Keep your skin clean and dry.  Maintain a healthy weight.  If you have diabetes, keep your blood sugar under control. SEEK IMMEDIATE MEDICAL CARE IF:  Your rash continues to spread despite treatment.  You have a fever, chills, or abdominal pain. Document Released: 07/21/2011 Document Revised: 01/25/2012 Document Reviewed: 07/21/2011 ExitCare Patient Information 2015 [employer]. This information is not intended to replace advice given to you by your health care provider. Make sure you discuss any questions you have with your health care provider.  

## 2015-01-29 NOTE — MAU Provider Note (Signed)
History     CSN: [id number]  Arrival date and time: 01/29/15 1024   First Provider Initiated Contact with Patient 01/29/15 1139      Chief Complaint  Patient presents with  . Vaginal Pain   HPI  [name] is a 24 y.o. G2P1001 at [redacted]w[redacted]d who presents to MAU today with complaint of vulvar irritation and redness. She denies vaginal bleeding or discharge. She has some blood noted in stool occasionally, but is aware of hemorrhoids with last pregnancy. She states occasional mild lower abdominal pulling with change of positions. Plans to start prenatal care with [facility] soon.   OB History    Gravida Para Term Preterm AB TAB SAB Ectopic Multiple Living   0 0 0 0 0 0 1      Past Medical History  Diagnosis Date  . No significant past medical history   . Loss of teeth due to extraction   . PCOS (polycystic ovarian syndrome)   . Anxiety   . Headache(784.0)   . Bronchitis   . Bacterial vaginosis     Pt stated she is prone to BV, "always comes and goes"    Past Surgical History  Procedure Laterality Date  . Mouth surgery      Family History  Problem Relation Age of Onset  . Cancer Other   . Hypertension Brother     History  Substance Use Topics  . Smoking status: Former Smoker    Quit date: 10/27/2012  . Smokeless tobacco: Never Used  . Alcohol Use: No     Comment: Occassional Use    Allergies:  Allergies  Allergen Reactions  . Latex Rash and Other (See Comments)    Burning     No prescriptions prior to admission    Review of Systems  Constitutional: Negative for fever and malaise/fatigue.  Gastrointestinal: Positive for abdominal pain. Negative for nausea, vomiting, diarrhea and constipation.  Genitourinary: Negative for dysuria, urgency and frequency.       Neg - vaginal bleeding, discharge   Physical Exam   Blood pressure 127/70, pulse 72, temperature 98.7 F (37.1 C), temperature source Oral, resp. rate 18, not currently  breastfeeding.  Physical Exam  Constitutional: She is oriented to person, place, and time. She appears well-developed and well-nourished. No distress.  HENT:  Head: Normocephalic.  Cardiovascular: Normal rate.   Respiratory: Effort normal.  GI: Soft. She exhibits no distension and no mass. There is no tenderness. There is no rebound and no guarding.  Genitourinary: Rectal exam shows external hemorrhoid (no active bleeding, small). There is rash and tenderness on the right labia. There is no lesion or injury on the right labia. There is rash and tenderness on the left labia. There is no lesion or injury on the left labia. Cervix exhibits no motion tenderness, no discharge and no friability. No bleeding in the vagina. No vaginal discharge found.  Cervix: visually closed  Neurological: She is alert and oriented to person, place, and time.  Skin: Skin is warm and dry. Rash noted. No erythema.  Psychiatric: She has a normal mood and affect.   Results for orders placed or performed during the hospital encounter of 01/29/15 (from the past 24 hour(s))  Urinalysis, Routine w reflex microscopic     Status: None   Collection Time: 01/29/15 10:40 AM  Result Value Ref Range   Color, Urine YELLOW YELLOW   APPearance CLEAR CLEAR   Specific Gravity, Urine 1.025 1.005 - 1.030  pH 6.5 5.0 - 8.0   Glucose, UA NEGATIVE NEGATIVE mg/dL   Hgb urine dipstick NEGATIVE NEGATIVE   Bilirubin Urine NEGATIVE NEGATIVE   Ketones, ur NEGATIVE NEGATIVE mg/dL   Protein, ur NEGATIVE NEGATIVE mg/dL   Urobilinogen, UA 0.2 0.0 - 1.0 mg/dL   Nitrite NEGATIVE NEGATIVE   Leukocytes, UA NEGATIVE NEGATIVE  Wet prep, genital     Status: Abnormal   Collection Time: 01/29/15 11:49 AM  Result Value Ref Range   Yeast Wet Prep HPF POC NONE SEEN NONE SEEN   Trich, Wet Prep NONE SEEN NONE SEEN   Clue Cells Wet Prep HPF POC NONE SEEN NONE SEEN   WBC, Wet Prep HPF POC FEW (A) NONE SEEN    MAU Course   Procedures None  MDM FHR - 150 bpm with doppler Wet prep, GC/Chlamydia, RPR and HIV today  Assessment and Plan  A: SIUP at 2355w0d Yeast dermatitis  P: Discharge home Rx for Nystatin and Triamcinolone cream given to patient  Patient advised to follow-up with [facility] as scheduled to start prenatal care Patient may return to MAU as needed or if her condition were to change or worsen   Marny LowensteinJulie N Wenzel, PA-C  01/29/2015, 2:05 PM

## 2015-01-29 NOTE — MAU Note (Signed)
"  My vagina is in agony, feels like maybe cuts."  Pt shaved some yesterday to relieve some irritation but states she thinks it is worse.  Also having yellow milky discharge, having pain during intercourse.  Denies vaginal bleeding but has rectal bleeding due to hemorrhoids.

## 2015-01-30 LAB — RPR: RPR Ser Ql: NONREACTIVE

## 2015-01-30 LAB — GC/CHLAMYDIA PROBE AMP (~~LOC~~) NOT AT ARMC
CHLAMYDIA, DNA PROBE: NEGATIVE
NEISSERIA GONORRHEA: NEGATIVE

## 2015-01-30 LAB — HIV ANTIBODY (ROUTINE TESTING W REFLEX): HIV Screen 4th Generation wRfx: NONREACTIVE

## 2015-01-31 ENCOUNTER — Encounter: Payer: Self-pay | Admitting: Obstetrics & Gynecology

## 2015-01-31 ENCOUNTER — Ambulatory Visit (INDEPENDENT_AMBULATORY_CARE_PROVIDER_SITE_OTHER): Payer: Medicaid Other | Admitting: Obstetrics & Gynecology

## 2015-01-31 VITALS — BP 120/69 | HR 71 | Temp 98.5°F | Ht 64.0 in | Wt 219.5 lb

## 2015-01-31 DIAGNOSIS — Z3490 Encounter for supervision of normal pregnancy, unspecified, unspecified trimester: Secondary | ICD-10-CM | POA: Insufficient documentation

## 2015-01-31 DIAGNOSIS — Z3492 Encounter for supervision of normal pregnancy, unspecified, second trimester: Secondary | ICD-10-CM

## 2015-01-31 DIAGNOSIS — Z23 Encounter for immunization: Secondary | ICD-10-CM

## 2015-01-31 DIAGNOSIS — O219 Vomiting of pregnancy, unspecified: Secondary | ICD-10-CM | POA: Diagnosis not present

## 2015-01-31 LAB — POCT URINALYSIS DIP (DEVICE)
Bilirubin Urine: NEGATIVE
GLUCOSE, UA: NEGATIVE mg/dL
HGB URINE DIPSTICK: NEGATIVE
Leukocytes, UA: NEGATIVE
NITRITE: NEGATIVE
PROTEIN: 30 mg/dL — AB
Specific Gravity, Urine: 1.025 (ref 1.005–1.030)
Urobilinogen, UA: 0.2 mg/dL (ref 0.0–1.0)
pH: 6.5 (ref 5.0–8.0)

## 2015-01-31 MED ORDER — PANTOPRAZOLE SODIUM 40 MG PO TBEC: 40.0000 mg | DELAYED_RELEASE_TABLET | Freq: Every day | ORAL | Status: DC

## 2015-01-31 MED ORDER — DOXYLAMINE-PYRIDOXINE 10-10 MG PO TBEC: 1.0000 | DELAYED_RELEASE_TABLET | Freq: Every day | ORAL | Status: DC

## 2015-01-31 NOTE — Progress Notes (Signed)
Anatomy U/S 02/28/15 @ 10a with [facility].

## 2015-01-31 NOTE — Progress Notes (Signed)
Initial OB appt. New OB educational material given 1 hour gtt with new OB labs Flu vaccine.

## 2015-01-31 NOTE — Patient Instructions (Signed)
Second Trimester of Pregnancy The second trimester is from week 13 through week 28, months 4 through 6. The second trimester is often a time when you feel your best. Your body has also adjusted to being pregnant, and you begin to feel better physically. Usually, morning sickness has lessened or quit completely, you may have more energy, and you may have an increase in appetite. The second trimester is also a time when the fetus is growing rapidly. At the end of the sixth month, the fetus is about 9 inches long and weighs about 1 pounds. You will likely begin to feel the baby move (quickening) between 18 and 20 weeks of the pregnancy. BODY CHANGES Your body goes through many changes during pregnancy. The changes vary from woman to woman.   Your weight will continue to increase. You will notice your lower abdomen bulging out.  You may begin to get stretch marks on your hips, abdomen, and breasts.  You may develop headaches that can be relieved by medicines approved by your health care provider.  You may urinate more often because the fetus is pressing on your bladder.  You may develop or continue to have heartburn as a result of your pregnancy.  You may develop constipation because certain hormones are causing the muscles that push waste through your intestines to slow down.  You may develop hemorrhoids or swollen, bulging veins (varicose veins).  You may have back pain because of the weight gain and pregnancy hormones relaxing your joints between the bones in your pelvis and as a result of a shift in weight and the muscles that support your balance.  Your breasts will continue to grow and be tender.  Your gums may bleed and may be sensitive to brushing and flossing.  Dark spots or blotches (chloasma, mask of pregnancy) may develop on your face. This will likely fade after the baby is born.  A dark line from your belly button to the pubic area (linea nigra) may appear. This will likely fade  after the baby is born.  You may have changes in your hair. These can include thickening of your hair, rapid growth, and changes in texture. Some women also have hair loss during or after pregnancy, or hair that feels dry or thin. Your hair will most likely return to normal after your baby is born. WHAT TO EXPECT AT YOUR PRENATAL VISITS During a routine prenatal visit:  You will be weighed to make sure you and the fetus are growing normally.  Your blood pressure will be taken.  Your abdomen will be measured to track your baby's growth.  The fetal heartbeat will be listened to.  Any test results from the previous visit will be discussed. Your health care provider may ask you:  How you are feeling.  If you are feeling the baby move.  If you have had any abnormal symptoms, such as leaking fluid, bleeding, severe headaches, or abdominal cramping.  If you have any questions. Other tests that may be performed during your second trimester include:  Blood tests that check for:  Low iron levels (anemia).  Gestational diabetes (between 24 and 28 weeks).  Rh antibodies.  Urine tests to check for infections, diabetes, or protein in the urine.  An ultrasound to confirm the proper growth and development of the baby.  An amniocentesis to check for possible genetic problems.  Fetal screens for spina bifida and Down syndrome. HOME CARE INSTRUCTIONS   Avoid all smoking, herbs, alcohol, and unprescribed   drugs. These chemicals affect the formation and growth of the baby.  Follow your health care provider's instructions regarding medicine use. There are medicines that are either safe or unsafe to take during pregnancy.  Exercise only as directed by your health care provider. Experiencing uterine cramps is a good sign to stop exercising.  Continue to eat regular, healthy meals.  Wear a good support bra for breast tenderness.  Do not use hot tubs, steam rooms, or saunas.  Wear your  seat belt at all times when driving.  Avoid raw meat, uncooked cheese, cat litter boxes, and soil used by cats. These carry germs that can cause birth defects in the baby.  Take your prenatal vitamins.  Try taking a stool softener (if your health care provider approves) if you develop constipation. Eat more high-fiber foods, such as fresh vegetables or fruit and whole grains. Drink plenty of fluids to keep your urine clear or pale yellow.  Take warm sitz baths to soothe any pain or discomfort caused by hemorrhoids. Use hemorrhoid cream if your health care provider approves.  If you develop varicose veins, wear support hose. Elevate your feet for 15 minutes, 3-4 times a day. Limit salt in your diet.  Avoid heavy lifting, wear low heel shoes, and practice good posture.  Rest with your legs elevated if you have leg cramps or low back pain.  Visit your dentist if you have not gone yet during your pregnancy. Use a soft toothbrush to brush your teeth and be gentle when you floss.  A sexual relationship may be continued unless your health care provider directs you otherwise.  Continue to go to all your prenatal visits as directed by your health care provider. SEEK MEDICAL CARE IF:   You have dizziness.  You have mild pelvic cramps, pelvic pressure, or nagging pain in the abdominal area.  You have persistent nausea, vomiting, or diarrhea.  You have a bad smelling vaginal discharge.  You have pain with urination. SEEK IMMEDIATE MEDICAL CARE IF:   You have a fever.  You are leaking fluid from your vagina.  You have spotting or bleeding from your vagina.  You have severe abdominal cramping or pain.  You have rapid weight gain or loss.  You have shortness of breath with chest pain.  You notice sudden or extreme swelling of your face, hands, ankles, feet, or legs.  You have not felt your baby move in over an hour.  You have severe headaches that do not go away with  medicine.  You have vision changes. Document Released: 10/27/2001 Document Revised: 11/07/2013 Document Reviewed: 01/03/2013 ExitCare Patient Information 2015 [employer]. This information is not intended to replace advice given to you by your health care provider. Make sure you discuss any questions you have with your health care provider.  

## 2015-01-31 NOTE — Progress Notes (Signed)
New OB,G2P1001 4459w2d H/o PP preeclampsia  Subjective:new ob, dating by 9 week US    Kellie Earlene PlaterDavis is a G2P1001 5868w3d being seen today for her first obstetrical visit.  Her obstetrical history is significant for pre-eclampsia and last pregnancy. Patient does intend to breast feed. Pregnancy history fully reviewed.  Patient reports heartburn and nausea.  Filed Vitals:   01/31/15 0927 01/31/15 0929  BP: 120/69   Pulse: 71   Temp: 98.5 F (36.9 C)   Height:  5\' 4"  (1.626 m)  Weight: 219 lb 8 oz (99.565 kg)     HISTORY: OB History  Gravida Para Term Preterm AB SAB TAB Ectopic Multiple Living  2 1 1  0 0 0 0 0 0 1    # Outcome Date GA Lbr Len/2nd Weight Sex Delivery Anes PTL Lv  2 Current           1 Term 03/10/14 3866w1d 14:10 / 01:07 6 lb 3.7 oz (2.825 kg) F Vag-Spont EPI  Y     Comments: WNL     Past Medical History  Diagnosis Date  . No significant past medical history   . Loss of teeth due to extraction   . PCOS (polycystic ovarian syndrome)   . Anxiety   . Headache(784.0)   . Bronchitis   . Bacterial vaginosis     Pt stated she is prone to BV, "always comes and goes"  . Gestational diabetes 2015  . Pregnancy induced hypertension 2015   Past Surgical History  Procedure Laterality Date  . Mouth surgery     Family History  Problem Relation Age of Onset  . Cancer Other   . Diabetes Maternal Grandmother   . Hypertension Maternal Grandmother   . Cancer Maternal Grandfather      Exam    Uterus:     Pelvic Exam:    Perineum: No Hemorrhoids   Vulva: normal   Vagina:  normal mucosa   pH:     Cervix: no lesions   Adnexa: normal adnexa   Bony Pelvis: average  System: Breast:  normal appearance, no masses or tenderness   Skin: normal coloration and turgor, no rashes    Neurologic: oriented, normal mood   Extremities: normal strength, tone, and muscle mass   HEENT sclera clear, anicteric   Mouth/Teeth dental hygiene good   Neck supple   Cardiovascular:  regular rate and rhythm   Respiratory:  appears well, vitals normal, no respiratory distress, acyanotic, normal RR, neck free of mass or lymphadenopathy, chest clear, no wheezing, crepitations, rhonchi, normal symmetric air entry   Abdomen: soft, non-tender; bowel sounds normal; no masses,  no organomegaly   Urinary: urethral meatus normal      Assessment:    Pregnancy: G2P1001 Patient Active Problem List   Diagnosis Date Noted  . Supervision of low-risk pregnancy 01/31/2015  . Mental disorders of mother, antepartum(648.43) 09/06/2013        Plan:     Initial labs drawn. Prenatal vitamins. Problem list reviewed and updated. Genetic Screening discussed Quad Screen: requested.  Ultrasound discussed; fetal survey: 18 weeks.  Follow up in 4 weeks. 50% of 30 min visit spent on counseling and coordination of care.      Kellie Deleon 02/01/2015

## 2015-01-31 NOTE — Progress Notes (Signed)
Nutrition note: 1st visit consult Pt has h/o obesity. Pt has lost 0.5# @ 5244w2d. Pt reports she has been having a lot of N&V as well as heartburn. Pt reports only eating ~2x/d due to N&V. Pt is taking a PNV. Pt received verbal & written education on general nutrition during pregnancy. Discussed tips to decrease N&V and heartburn. Also, MD started pt today on meds for N&V and heartburn. Discussed wt gain goals of 11-20# or 0.5#/wk. Pt agrees to consume protein with all meals & snacks. Pt has WIC & plans to BF. F/u in 4-6 wks Blondell RevealLaura Amika Tassin, MS, RD, LDN, [facility] HospitalBCLC

## 2015-02-01 LAB — PRENATAL PROFILE (SOLSTAS)
ANTIBODY SCREEN: NEGATIVE
BASOS ABS: 0 10*3/uL (ref 0.0–0.1)
Basophils Relative: 0 % (ref 0–1)
EOS PCT: 0 % (ref 0–5)
Eosinophils Absolute: 0 10*3/uL (ref 0.0–0.7)
HEMATOCRIT: 39.5 % (ref 36.0–46.0)
HEMOGLOBIN: 13 g/dL (ref 12.0–15.0)
HIV: NONREACTIVE
Hepatitis B Surface Ag: NEGATIVE
LYMPHS PCT: 16 % (ref 12–46)
Lymphs Abs: 1.4 10*3/uL (ref 0.7–4.0)
MCH: 30.4 pg (ref 26.0–34.0)
MCHC: 32.9 g/dL (ref 30.0–36.0)
MCV: 92.3 fL (ref 78.0–100.0)
MONOS PCT: 5 % (ref 3–12)
MPV: 10.8 fL (ref 8.6–12.4)
Monocytes Absolute: 0.4 10*3/uL (ref 0.1–1.0)
Neutro Abs: 7 10*3/uL (ref 1.7–7.7)
Neutrophils Relative %: 79 % — ABNORMAL HIGH (ref 43–77)
Platelets: 270 10*3/uL (ref 150–400)
RBC: 4.28 MIL/uL (ref 3.87–5.11)
RDW: 13.4 % (ref 11.5–15.5)
Rh Type: POSITIVE
Rubella: 6.8 Index — ABNORMAL HIGH (ref <0.90)
WBC: 8.8 10*3/uL (ref 4.0–10.5)

## 2015-02-01 LAB — CULTURE, OB URINE: Colony Count: >=100000

## 2015-02-01 LAB — GLUCOSE TOLERANCE, 1 HOUR (50G) W/O FASTING: Glucose, 1 Hour GTT: 93 mg/dL (ref 70–140)

## 2015-02-02 LAB — PRESCRIPTION MONITORING PROFILE (19 PANEL)
Amphetamine/Meth: NEGATIVE ng/mL
BUPRENORPHINE, URINE: NEGATIVE ng/mL
Barbiturate Screen, Urine: NEGATIVE ng/mL
Benzodiazepine Screen, Urine: NEGATIVE ng/mL
CANNABINOID SCRN UR: NEGATIVE ng/mL
Carisoprodol, Urine: NEGATIVE ng/mL
Cocaine Metabolites: NEGATIVE ng/mL
Creatinine, Urine: 426.21 mg/dL (ref >20.0)
Fentanyl, Ur: NEGATIVE ng/mL
MDMA URINE: NEGATIVE ng/mL
METHADONE SCREEN, URINE: NEGATIVE ng/mL
Meperidine, Ur: NEGATIVE ng/mL
Methaqualone: NEGATIVE ng/mL
Nitrites, Initial: NEGATIVE ug/mL
Opiate Screen, Urine: NEGATIVE ng/mL
Oxycodone Screen, Ur: NEGATIVE ng/mL
PHENCYCLIDINE, UR: NEGATIVE ng/mL
PROPOXYPHENE: NEGATIVE ng/mL
Tapentadol, urine: NEGATIVE ng/mL
Tramadol Scrn, Ur: NEGATIVE ng/mL
Zolpidem, Urine: NEGATIVE ng/mL
pH, Initial: 6.4 pH (ref 4.5–8.9)

## 2015-02-07 ENCOUNTER — Encounter: Payer: Self-pay | Admitting: Family Medicine

## 2015-02-07 DIAGNOSIS — O09299 Supervision of pregnancy with other poor reproductive or obstetric history, unspecified trimester: Secondary | ICD-10-CM | POA: Insufficient documentation

## 2015-02-11 ENCOUNTER — Inpatient Hospital Stay (HOSPITAL_COMMUNITY)
Admission: AD | Admit: 2015-02-11 | Discharge: 2015-02-11 | Disposition: A | Discharge status: Home / Self Care | Payer: Medicaid Other | Source: Ambulatory Visit | Attending: Obstetrics & Gynecology | Admitting: Obstetrics & Gynecology

## 2015-02-11 ENCOUNTER — Encounter (HOSPITAL_COMMUNITY): Payer: Self-pay

## 2015-02-11 DIAGNOSIS — J069 Acute upper respiratory infection, unspecified: Secondary | ICD-10-CM | POA: Diagnosis not present

## 2015-02-11 DIAGNOSIS — E282 Polycystic ovarian syndrome: Secondary | ICD-10-CM | POA: Diagnosis not present

## 2015-02-11 DIAGNOSIS — Z87891 Personal history of nicotine dependence: Secondary | ICD-10-CM | POA: Diagnosis not present

## 2015-02-11 DIAGNOSIS — O9989 Other specified diseases and conditions complicating pregnancy, childbirth and the puerperium: Secondary | ICD-10-CM | POA: Diagnosis not present

## 2015-02-11 DIAGNOSIS — Z91040 Latex allergy status: Secondary | ICD-10-CM | POA: Diagnosis not present

## 2015-02-11 DIAGNOSIS — O99512 Diseases of the respiratory system complicating pregnancy, second trimester: Secondary | ICD-10-CM | POA: Diagnosis not present

## 2015-02-11 DIAGNOSIS — R05 Cough: Secondary | ICD-10-CM | POA: Diagnosis not present

## 2015-02-11 DIAGNOSIS — B9789 Other viral agents as the cause of diseases classified elsewhere: Secondary | ICD-10-CM

## 2015-02-11 DIAGNOSIS — O09292 Supervision of pregnancy with other poor reproductive or obstetric history, second trimester: Secondary | ICD-10-CM

## 2015-02-11 DIAGNOSIS — R0981 Nasal congestion: Secondary | ICD-10-CM | POA: Diagnosis present

## 2015-02-11 DIAGNOSIS — Z3A15 15 weeks gestation of pregnancy: Secondary | ICD-10-CM | POA: Insufficient documentation

## 2015-02-11 MED ORDER — IBUPROFEN 600 MG PO TABS
600.0000 mg | ORAL_TABLET | Freq: Once | ORAL | Status: AC
  Administered 2015-02-11: 600 mg via ORAL
  Filled 2015-02-11: qty 1

## 2015-02-11 MED ORDER — PSEUDOEPHEDRINE HCL 30 MG PO TABS
60.0000 mg | ORAL_TABLET | Freq: Once | ORAL | Status: AC
  Administered 2015-02-11: 60 mg via ORAL
  Filled 2015-02-11 (×2): qty 2

## 2015-02-11 NOTE — Discharge Instructions (Signed)
Upper Respiratory Infection, Adult An upper respiratory infection (URI) is also known as the common cold. It is often caused by a type of germ (virus). Colds are easily spread (contagious). You can pass it to others by kissing, coughing, sneezing, or drinking out of the same glass. Usually, you get better in 1 or 2 weeks.  HOME CARE   Only take medicine as told by your doctor.  Use a warm mist humidifier or breathe in steam from a hot shower.  Drink enough water and fluids to keep your pee (urine) clear or pale yellow.  Get plenty of rest.  Return to work when your temperature is back to normal or as told by your doctor. You may use a face mask and wash your hands to stop your cold from spreading. GET HELP RIGHT AWAY IF:   After the first few days, you feel you are getting worse.  You have questions about your medicine.  You have chills, shortness of breath, or brown or red spit (mucus).  You have yellow or brown snot (nasal discharge) or pain in the face, especially when you bend forward.  You have a fever, puffy (swollen) neck, pain when you swallow, or white spots in the back of your throat.  You have a bad headache, ear pain, sinus pain, or chest pain.  You have a high-pitched whistling sound when you breathe in and out (wheezing).  You have a lasting cough or cough up blood.  You have sore muscles or a stiff neck. MAKE SURE YOU:   Understand these instructions.  Will watch your condition.  Will get help right away if you are not doing well or get worse. Document Released: 04/20/2008 Document Revised: 01/25/2012 Document Reviewed: 02/07/2014 ExitCare Patient Information 2015 [employer]. This information is not intended to replace advice given to you by your health care provider. Make sure you discuss any questions you have with your health care provider.  

## 2015-02-11 NOTE — MAU Provider Note (Signed)
History     CSN: [id number]  Arrival date and time: 02/11/15 1312   First Provider Initiated Contact with Patient 02/11/15 1407      Chief Complaint  Patient presents with  . Nasal Congestion  . Sinusitis   HPI Comments: Kellie Deleon is a 24 y.o. G2P1001 at  2753w6d who presents today with congestion and headache x 4 days. She states that her daughter had a cold last week and she thinks she may have caught something from her. She states that her daughter was seen by the pediatrician and was not given any antibiotics. She states that her throat is sore from coughing.   Sinusitis This is a new problem. The current episode started in the past 7 days. The problem is unchanged. There has been no fever. Her pain is at a severity of 6/10. Associated symptoms include coughing and headaches. Pertinent negatives include no chills or shortness of breath. Past treatments include acetaminophen. The treatment provided no relief.    Past Medical History  Diagnosis Date  . No significant past medical history   . Loss of teeth due to extraction   . PCOS (polycystic ovarian syndrome)   . Anxiety   . Headache(784.0)   . Bronchitis   . Bacterial vaginosis     Pt stated she is prone to BV, "always comes and goes"  . Gestational diabetes 2015  . Pregnancy induced hypertension 2015    Past Surgical History  Procedure Laterality Date  . Mouth surgery      Family History  Problem Relation Age of Onset  . Cancer Other   . Diabetes Maternal Grandmother   . Hypertension Maternal Grandmother   . Cancer Maternal Grandfather     History  Substance Use Topics  . Smoking status: Former Smoker    Quit date: 10/27/2012  . Smokeless tobacco: Never Used  . Alcohol Use: No     Comment: Occassional Use    Allergies:  Allergies  Allergen Reactions  . Latex Rash and Other (See Comments)    Burning     Prescriptions prior to admission  Medication Sig Dispense Refill Last Dose  . acetaminophen  (TYLENOL) 500 MG tablet Take 1,000 mg by mouth every 6 (six) hours as needed for headache.   02/10/2015 at Unknown time  . Doxylamine-Pyridoxine (DICLEGIS) 10-10 MG TBEC Take 1 tablet by mouth at bedtime. 60 tablet 1 02/10/2015 at Unknown time  . Prenatal Vit-Fe Fumarate-FA (PRENATAL VITAMINS PLUS) 27-1 MG TABS Take 1 tablet by mouth daily. 30 tablet 11 Past Week at Unknown time  . nystatin cream (MYCOSTATIN) Apply to affected area 2 times daily (Patient not taking: Reported on 02/11/2015) 15 g 0 Taking  . pantoprazole (PROTONIX) 40 MG tablet Take 1 tablet (40 mg total) by mouth daily. (Patient not taking: Reported on 02/11/2015) 30 tablet 5   . triamcinolone cream (KENALOG) 0.1 % Apply 1 application topically 2 (two) times daily. (Patient not taking: Reported on 02/11/2015) 30 g 0 Not Taking    Review of Systems  Constitutional: Negative for fever and chills.  Respiratory: Positive for cough and sputum production. Negative for shortness of breath.   Gastrointestinal: Positive for nausea. Negative for vomiting, abdominal pain and diarrhea.  Neurological: Positive for headaches.   Physical Exam   Blood pressure 127/64, pulse 103, temperature 98.4 F (36.9 C), temperature source Oral, resp. rate 20, height 5\' 4"  (1.626 m), weight 99.791 kg (220 lb), SpO2 100 %, not currently breastfeeding.  Physical Exam  Nursing note and vitals reviewed. Constitutional: She is oriented to person, place, and time. She appears well-developed and well-nourished. No distress.  Cardiovascular: Normal rate.   Respiratory: Effort normal and breath sounds normal. No respiratory distress. She has no wheezes.  GI: Soft. There is no tenderness.  Neurological: She is alert and oriented to person, place, and time.  Skin: Skin is warm.  Psychiatric: She has a normal mood and affect.    MAU Course  Procedures  MDM Patient has had  ibuprofen and  sudafed   1529: Patient states that she is feeling better.  Headache and congestion have improved. Patient is normotensive today   Assessment and Plan   1. History of pre-eclampsia in prior pregnancy, currently pregnant, second trimester   2. Viral URI with cough    DC home Comfort measures reviewed Return to MAU as needed  Follow-up Information    Follow up with [facility].   Specialty:  Obstetrics and Gynecology   Why:  As scheduled   Contact information:   [address] [place] [address] [phone]       Tawnya Crook 02/11/2015, 2:11 PM

## 2015-02-11 NOTE — MAU Note (Addendum)
Headache feels like it is going to explode, frontal pain, sore throat, nasal congestion. Symptoms x4 day

## 2015-02-28 ENCOUNTER — Encounter (HOSPITAL_COMMUNITY): Payer: Self-pay

## 2015-02-28 ENCOUNTER — Ambulatory Visit (HOSPITAL_COMMUNITY): Payer: Medicaid Other

## 2015-02-28 ENCOUNTER — Ambulatory Visit (INDEPENDENT_AMBULATORY_CARE_PROVIDER_SITE_OTHER): Payer: Medicaid Other | Admitting: Family

## 2015-02-28 ENCOUNTER — Ambulatory Visit (HOSPITAL_COMMUNITY)
Admission: RE | Admit: 2015-02-28 | Discharge: 2015-02-28 | Disposition: A | Discharge status: Home / Self Care | Payer: Medicaid Other | Source: Ambulatory Visit | Attending: Obstetrics & Gynecology | Admitting: Obstetrics & Gynecology

## 2015-02-28 VITALS — BP 119/66 | HR 97 | Temp 98.0°F | Wt 219.9 lb

## 2015-02-28 VITALS — BP 121/65 | HR 90 | Wt 220.5 lb

## 2015-02-28 DIAGNOSIS — Z3A18 18 weeks gestation of pregnancy: Secondary | ICD-10-CM | POA: Diagnosis not present

## 2015-02-28 DIAGNOSIS — O09292 Supervision of pregnancy with other poor reproductive or obstetric history, second trimester: Secondary | ICD-10-CM

## 2015-02-28 DIAGNOSIS — Z3689 Encounter for other specified antenatal screening: Secondary | ICD-10-CM | POA: Insufficient documentation

## 2015-02-28 DIAGNOSIS — Z36 Encounter for antenatal screening of mother: Secondary | ICD-10-CM | POA: Insufficient documentation

## 2015-02-28 DIAGNOSIS — E669 Obesity, unspecified: Secondary | ICD-10-CM | POA: Diagnosis not present

## 2015-02-28 DIAGNOSIS — Z23 Encounter for immunization: Secondary | ICD-10-CM

## 2015-02-28 DIAGNOSIS — O219 Vomiting of pregnancy, unspecified: Secondary | ICD-10-CM

## 2015-02-28 DIAGNOSIS — O99212 Obesity complicating pregnancy, second trimester: Secondary | ICD-10-CM | POA: Insufficient documentation

## 2015-02-28 DIAGNOSIS — Z3492 Encounter for supervision of normal pregnancy, unspecified, second trimester: Secondary | ICD-10-CM

## 2015-02-28 LAB — POCT URINALYSIS DIP (DEVICE)
BILIRUBIN URINE: NEGATIVE
GLUCOSE, UA: NEGATIVE mg/dL
HGB URINE DIPSTICK: NEGATIVE
Ketones, ur: NEGATIVE mg/dL
NITRITE: NEGATIVE
Protein, ur: NEGATIVE mg/dL
SPECIFIC GRAVITY, URINE: 1.025 (ref 1.005–1.030)
Urobilinogen, UA: 0.2 mg/dL (ref 0.0–1.0)
pH: 6.5 (ref 5.0–8.0)

## 2015-02-28 NOTE — Progress Notes (Signed)
C/o" lower back pain" 

## 2015-02-28 NOTE — Progress Notes (Signed)
Ultrasound today at 10am.  Doing well.  Discussed taking baba ASA, not taking yet.  Plans to begin taking today.  Quad collected today.  Reports teeth pain, needs referral.  Given dentist letter.

## 2015-02-28 NOTE — Progress Notes (Signed)
UA showed trace ketones

## 2015-03-05 LAB — AFP, QUAD SCREEN
AFP: 27.4 ng/mL
Age Alone: 1:1060 {titer}
CURR GEST AGE: 18.2 wks.days
Down Syndrome Scr Risk Est: 1:323 {titer}
HCG, Total: 31.49 IU/mL
INH: 181.9 pg/mL
Interpretation-AFP: NEGATIVE
MoM for AFP: 0.66
MoM for INH: 1.26
MoM for hCG: 1.39
Open Spina bifida: NEGATIVE
Tri 18 Scr Risk Est: NEGATIVE
UE3 MOM: 0.62
UE3 VALUE: 0.78 ng/mL

## 2015-03-15 ENCOUNTER — Other Ambulatory Visit (HOSPITAL_COMMUNITY): Payer: Self-pay | Admitting: Maternal and Fetal Medicine

## 2015-03-15 DIAGNOSIS — O09299 Supervision of pregnancy with other poor reproductive or obstetric history, unspecified trimester: Secondary | ICD-10-CM

## 2015-03-15 DIAGNOSIS — O99212 Obesity complicating pregnancy, second trimester: Secondary | ICD-10-CM

## 2015-03-15 DIAGNOSIS — O352XX0 Maternal care for (suspected) hereditary disease in fetus, not applicable or unspecified: Secondary | ICD-10-CM

## 2015-03-20 ENCOUNTER — Inpatient Hospital Stay (HOSPITAL_COMMUNITY)
Admission: AD | Admit: 2015-03-20 | Discharge: 2015-03-20 | Disposition: A | Discharge status: Home / Self Care | Payer: Medicaid Other | Source: Ambulatory Visit | Attending: Obstetrics and Gynecology | Admitting: Obstetrics and Gynecology

## 2015-03-20 ENCOUNTER — Encounter (HOSPITAL_COMMUNITY): Payer: Self-pay | Admitting: *Deleted

## 2015-03-20 DIAGNOSIS — K5900 Constipation, unspecified: Secondary | ICD-10-CM | POA: Insufficient documentation

## 2015-03-20 DIAGNOSIS — O4692 Antepartum hemorrhage, unspecified, second trimester: Secondary | ICD-10-CM | POA: Diagnosis not present

## 2015-03-20 DIAGNOSIS — Z3A21 21 weeks gestation of pregnancy: Secondary | ICD-10-CM | POA: Insufficient documentation

## 2015-03-20 DIAGNOSIS — K648 Other hemorrhoids: Secondary | ICD-10-CM | POA: Diagnosis not present

## 2015-03-20 DIAGNOSIS — Z87891 Personal history of nicotine dependence: Secondary | ICD-10-CM | POA: Insufficient documentation

## 2015-03-20 DIAGNOSIS — K649 Unspecified hemorrhoids: Secondary | ICD-10-CM | POA: Insufficient documentation

## 2015-03-20 DIAGNOSIS — K644 Residual hemorrhoidal skin tags: Secondary | ICD-10-CM

## 2015-03-20 DIAGNOSIS — N898 Other specified noninflammatory disorders of vagina: Secondary | ICD-10-CM | POA: Diagnosis present

## 2015-03-20 LAB — URINALYSIS, ROUTINE W REFLEX MICROSCOPIC
Bilirubin Urine: NEGATIVE
GLUCOSE, UA: NEGATIVE mg/dL
Hgb urine dipstick: NEGATIVE
KETONES UR: 15 mg/dL — AB
LEUKOCYTES UA: NEGATIVE
NITRITE: NEGATIVE
PROTEIN: NEGATIVE mg/dL
Specific Gravity, Urine: >1.03 — ABNORMAL HIGH (ref 1.005–1.030)
Urobilinogen, UA: 0.2 mg/dL (ref 0.0–1.0)
pH: 6 (ref 5.0–8.0)

## 2015-03-20 MED ORDER — HYDROCORTISONE ACE-PRAMOXINE 1-1 % RE FOAM: 1.0000 | Freq: Two times a day (BID) | RECTAL | Status: DC

## 2015-03-20 MED ORDER — DOCUSATE SODIUM 100 MG PO CAPS: 100.0000 mg | ORAL_CAPSULE | Freq: Two times a day (BID) | ORAL | Status: DC | PRN

## 2015-03-20 MED ORDER — POLYETHYLENE GLYCOL 3350 17 G PO PACK: 17.0000 g | PACK | Freq: Every day | ORAL | Status: DC

## 2015-03-20 NOTE — MAU Note (Signed)
Pt reports for the past 2-3 weeks she has had some blood on the tissue. Pt states at night she sometimes has sharp pains in her lower abd. Reports occasional burning with urination.

## 2015-03-20 NOTE — Discharge Instructions (Signed)

## 2015-03-20 NOTE — MAU Note (Signed)
Pt states she has been having pain and bleeding for about 2wks.pt states bleeding started out looking stringy and it has turned into spots "bigger than drops"

## 2015-03-20 NOTE — MAU Provider Note (Signed)
History     CSN: [id number]  Arrival date and time: 03/20/15 [id number]   First Provider Initiated Contact with Patient 03/20/15 2051      No chief complaint on file.  Vaginal Bleeding The patient's primary symptoms include vaginal discharge. The patient's pertinent negatives include no genital itching or genital lesions. This is a recurrent problem. The current episode started 1 to 4 weeks ago. The problem occurs intermittently. The problem has been unchanged. The patient is experiencing no pain. Associated symptoms include constipation. Pertinent negatives include no abdominal pain, chills, diarrhea, fever, headaches, nausea or vomiting. The vaginal discharge was bloody. The vaginal bleeding is spotting. She has not been passing clots. She has not been passing tissue. Nothing aggravates the symptoms. She has tried nothing for the symptoms.   This is a 24 y.o. female at 1054w2d who presents with c/o vaginal bleeding in form of spots.  Was mucousy now drops of blood. Does have constipation and hemorrhoids. No cramping. States told provider two weeks ago at visit but they said it was normal.   RN Note: Pt states she has been having pain and bleeding for about 2wks.pt states bleeding started out looking stringy and it has turned into spots "bigger than drops"        OB History    Gravida Para Term Preterm AB TAB SAB Ectopic Multiple Living   2 1 1  0 0 0 0 0 0 1      Past Medical History  Diagnosis Date  . No significant past medical history   . Loss of teeth due to extraction   . PCOS (polycystic ovarian syndrome)   . Anxiety   . Headache(784.0)   . Bronchitis   . Bacterial vaginosis     Pt stated she is prone to BV, "always comes and goes"  . Gestational diabetes 2015  . Pregnancy induced hypertension 2015    Past Surgical History  Procedure Laterality Date  . Mouth surgery      Family History  Problem Relation Age of Onset  . Cancer Other   . Diabetes Maternal Grandmother    . Hypertension Maternal Grandmother   . Cancer Maternal Grandfather     History  Substance Use Topics  . Smoking status: Former Smoker    Quit date: 10/27/2012  . Smokeless tobacco: Never Used  . Alcohol Use: No     Comment: Occassional Use    Allergies:  Allergies  Allergen Reactions  . Latex Rash and Other (See Comments)    Burning     Prescriptions prior to admission  Medication Sig Dispense Refill Last Dose  . acetaminophen (TYLENOL) 500 MG tablet Take 1,000 mg by mouth every 6 (six) hours as needed for headache.   03/19/2015 at Unknown time  . aspirin EC 81 MG tablet Take 81 mg by mouth daily.   03/19/2015 at Unknown time  . pantoprazole (PROTONIX) 40 MG tablet Take 40 mg by mouth daily.   03/19/2015 at Unknown time  . Prenatal Vit-Fe Fumarate-FA (PRENATAL VITAMINS PLUS) 27-1 MG TABS Take 1 tablet by mouth daily. 30 tablet 11 03/19/2015 at Unknown time  . Doxylamine-Pyridoxine (DICLEGIS) 10-10 MG TBEC Take 1 tablet by mouth at bedtime. (Patient not taking: Reported on 03/20/2015) 60 tablet 1 Not Taking at Unknown time    Review of Systems  Constitutional: Negative for fever, chills and malaise/fatigue.  Gastrointestinal: Positive for constipation. Negative for nausea, vomiting, abdominal pain and diarrhea.  Genitourinary: Positive for vaginal bleeding and vaginal  discharge.  Neurological: Negative for dizziness and headaches.   Physical Exam   Blood pressure 134/75, pulse 85, temperature 98.3 F (36.8 C), temperature source Oral, resp. rate 18, height 5\' 5"  (1.651 m), weight 223 lb (101.152 kg), not currently breastfeeding.  Physical Exam  Constitutional: She is oriented to person, place, and time. She appears well-developed and well-nourished. No distress.  HENT:  Head: Normocephalic.  Cardiovascular: Normal rate.   Respiratory: Effort normal.  GI: Soft. She exhibits no distension. There is no tenderness. There is no rebound and no guarding.  Genitourinary: Vagina normal.  No vaginal discharge found.  Speculum exam No blood at all in vaginal vault Only scant white discharge No pink, red or brown Cervix long and closed  Musculoskeletal: Normal range of motion.  Neurological: She is alert and oriented to person, place, and time.  Skin: Skin is warm and dry.  Psychiatric: She has a normal mood and affect.    MAU Course  Procedures  MDM Discussed findings.  Patient states "it could be my hemorrhoid" Admits to constipation and hard stools   Assessment and Plan  A:  Bleeding in the second trimester       No evidence of vaginal bleeding       Probable hemorrhoidal bleeding       Constipation  P:  Discharge home       Rx Colace, Miralax and proctofoam hc       Follow up in clinic for prenatal visit       Come back if sees more bleeding   [facility]es[facility]ILLIAMS,Christianne Zacher 03/20/2015, 8:51 PM

## 2015-03-28 ENCOUNTER — Ambulatory Visit (INDEPENDENT_AMBULATORY_CARE_PROVIDER_SITE_OTHER): Payer: Medicaid Other | Admitting: Family

## 2015-03-28 VITALS — BP 108/71 | HR 104 | Temp 98.2°F | Wt 221.8 lb

## 2015-03-28 DIAGNOSIS — L299 Pruritus, unspecified: Secondary | ICD-10-CM

## 2015-03-28 DIAGNOSIS — Z3492 Encounter for supervision of normal pregnancy, unspecified, second trimester: Secondary | ICD-10-CM

## 2015-03-28 LAB — POCT URINALYSIS DIP (DEVICE)
BILIRUBIN URINE: NEGATIVE
GLUCOSE, UA: NEGATIVE mg/dL
HGB URINE DIPSTICK: NEGATIVE
Ketones, ur: NEGATIVE mg/dL
Leukocytes, UA: NEGATIVE
Nitrite: NEGATIVE
Protein, ur: NEGATIVE mg/dL
SPECIFIC GRAVITY, URINE: 1.025 (ref 1.005–1.030)
UROBILINOGEN UA: 0.2 mg/dL (ref 0.0–1.0)
pH: 6 (ref 5.0–8.0)

## 2015-03-28 NOTE — Progress Notes (Signed)
Pt doing well; reports itching on arms and abdomen x 2 weeks.  Denies itching on palms of hands and soles of feet.  Bile acids ordered.  Did not make it to dental appt.  Lost dental letter.  Pt given another copy.  Reviewed OB ultrasound - normal anatomy, limited views of face.  Next ultrasound scheduled for 04/11/15.  Taking ASA daily.

## 2015-03-30 LAB — BILE ACIDS, TOTAL: BILE ACIDS TOTAL: 6 umol/L (ref 0–19)

## 2015-04-11 ENCOUNTER — Encounter (HOSPITAL_COMMUNITY): Payer: Self-pay

## 2015-04-11 ENCOUNTER — Ambulatory Visit (HOSPITAL_COMMUNITY)
Admission: RE | Admit: 2015-04-11 | Discharge: 2015-04-11 | Disposition: A | Discharge status: Home / Self Care | Payer: Medicaid Other | Source: Ambulatory Visit | Attending: Maternal and Fetal Medicine | Admitting: Maternal and Fetal Medicine

## 2015-04-11 VITALS — BP 103/74 | HR 99 | Wt 226.0 lb

## 2015-04-11 DIAGNOSIS — Z36 Encounter for antenatal screening of mother: Secondary | ICD-10-CM | POA: Insufficient documentation

## 2015-04-11 DIAGNOSIS — O99212 Obesity complicating pregnancy, second trimester: Secondary | ICD-10-CM | POA: Diagnosis not present

## 2015-04-11 DIAGNOSIS — O352XX0 Maternal care for (suspected) hereditary disease in fetus, not applicable or unspecified: Secondary | ICD-10-CM

## 2015-04-11 DIAGNOSIS — O09299 Supervision of pregnancy with other poor reproductive or obstetric history, unspecified trimester: Secondary | ICD-10-CM

## 2015-04-11 DIAGNOSIS — O09292 Supervision of pregnancy with other poor reproductive or obstetric history, second trimester: Secondary | ICD-10-CM | POA: Diagnosis not present

## 2015-04-11 DIAGNOSIS — Z3A24 24 weeks gestation of pregnancy: Secondary | ICD-10-CM | POA: Diagnosis not present

## 2015-04-18 ENCOUNTER — Ambulatory Visit (INDEPENDENT_AMBULATORY_CARE_PROVIDER_SITE_OTHER): Payer: Medicaid Other | Admitting: Family

## 2015-04-18 VITALS — BP 107/65 | HR 85 | Wt 225.9 lb

## 2015-04-18 DIAGNOSIS — O09292 Supervision of pregnancy with other poor reproductive or obstetric history, second trimester: Secondary | ICD-10-CM

## 2015-04-18 DIAGNOSIS — Z3492 Encounter for supervision of normal pregnancy, unspecified, second trimester: Secondary | ICD-10-CM

## 2015-04-18 NOTE — Progress Notes (Signed)
Had some light spotting 2-3 days ago from vagina. Reports menstrual like cramps that become intense at times.

## 2015-04-18 NOTE — Progress Notes (Signed)
Reports some spotting two days ago.  +cramping.  No recent intercourse.  Speculum exam - no blood seen in vaginal vault or cervix.  Cervix closed.  Reviewed bleeding precautions with patient.  Reviewed ultrasound results - normal anatomy completed.  Urine results not available at discharge.

## 2015-04-26 NOTE — MAU Note (Signed)
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

## 2015-05-09 ENCOUNTER — Ambulatory Visit (HOSPITAL_COMMUNITY)
Admission: RE | Admit: 2015-05-09 | Discharge: 2015-05-09 | Disposition: A | Discharge status: Home / Self Care | Payer: Medicaid Other | Source: Ambulatory Visit | Attending: Family Medicine | Admitting: Family Medicine

## 2015-05-09 ENCOUNTER — Other Ambulatory Visit: Payer: Self-pay

## 2015-05-09 ENCOUNTER — Ambulatory Visit (INDEPENDENT_AMBULATORY_CARE_PROVIDER_SITE_OTHER): Payer: Medicaid Other | Admitting: Family

## 2015-05-09 VITALS — BP 123/72 | HR 94 | Temp 97.9°F | Wt 226.8 lb

## 2015-05-09 DIAGNOSIS — Z3493 Encounter for supervision of normal pregnancy, unspecified, third trimester: Secondary | ICD-10-CM

## 2015-05-09 DIAGNOSIS — R000 Tachycardia, unspecified: Secondary | ICD-10-CM | POA: Diagnosis not present

## 2015-05-09 DIAGNOSIS — Z23 Encounter for immunization: Secondary | ICD-10-CM

## 2015-05-09 DIAGNOSIS — Z3492 Encounter for supervision of normal pregnancy, unspecified, second trimester: Secondary | ICD-10-CM

## 2015-05-09 DIAGNOSIS — R0602 Shortness of breath: Secondary | ICD-10-CM | POA: Diagnosis not present

## 2015-05-09 LAB — POCT URINALYSIS DIP (DEVICE)
Bilirubin Urine: NEGATIVE
Glucose, UA: NEGATIVE mg/dL
Ketones, ur: NEGATIVE mg/dL
Nitrite: NEGATIVE
Protein, ur: NEGATIVE mg/dL
Specific Gravity, Urine: 1.02 (ref 1.005–1.030)
Urobilinogen, UA: 0.2 mg/dL (ref 0.0–1.0)
pH: 7 (ref 5.0–8.0)

## 2015-05-09 LAB — CBC
HEMATOCRIT: 36.5 % (ref 36.0–46.0)
Hemoglobin: 12.5 g/dL (ref 12.0–15.0)
MCH: 31.1 pg (ref 26.0–34.0)
MCHC: 34.2 g/dL (ref 30.0–36.0)
MCV: 90.8 fL (ref 78.0–100.0)
MPV: 11 fL (ref 8.6–12.4)
Platelets: 225 10*3/uL (ref 150–400)
RBC: 4.02 MIL/uL (ref 3.87–5.11)
RDW: 13.3 % (ref 11.5–15.5)
WBC: 10.6 10*3/uL — AB (ref 4.0–10.5)

## 2015-05-09 LAB — RPR

## 2015-05-09 MED ORDER — TETANUS-DIPHTH-ACELL PERTUSSIS 5-2.5-18.5 LF-MCG/0.5 IM SUSP
0.5000 mL | Freq: Once | INTRAMUSCULAR | Status: AC
  Administered 2015-05-09: 0.5 mL via INTRAMUSCULAR

## 2015-05-09 NOTE — Progress Notes (Signed)
28 wk labs with 1 hour gtt 28 wk educational material given Tdap vaccine Pt heart beating fast during sleep and shortness of breath

## 2015-05-09 NOTE — Progress Notes (Signed)
Subjective:  Kellie Deleon is a 24 y.o. G2P1001 at [redacted]w[redacted]d being seen today for ongoing prenatal care.  Patient reports racing of heart and shortness of breath.  Pt reports experiencing last night and today.  Sensation today is less than last night.  .Denies chest pain.  Contractions: Not present.  Vag. Bleeding: None. Movement: Present. Denies leaking of fluid.   The following portions of the patient's history were reviewed and updated as appropriate: allergies, current medications, past family history, past medical history, past social history, past surgical history and problem list.   Objective:   Filed Vitals:   05/09/15 0951  BP: 123/72  Pulse: 94  Temp: 97.9 F (36.6 C)  Weight: 226 lb 12.8 oz (102.876 kg)    Fetal Status: Fetal Heart Rate (bpm): 143 Fundal Height: 28 cm Movement: Present     General:  Alert, oriented and cooperative. Patient is in no acute distress.  Skin: Skin is warm and dry. No rash noted.   Cardiovascular: Maternal tachycardia; neg murmur or extra sounds  Respiratory: Effort and breath sounds normal, no problems with respiration noted  Abdomen: Soft, gravid, appropriate for gestational age. Pain/Pressure: Absent     Vaginal: Vag. Bleeding: None.       Cervix: Not evaluated        Extremities: Normal range of motion.  Edema: Trace  Mental Status: Normal mood and affect. Normal behavior. Normal judgment and thought content.   Urinalysis:      Assessment and Plan:  Pregnancy: G2P1001 at [redacted]w[redacted]d  1. Need for Tdap vaccination - Tdap (BOOSTRIX) injection 0.5 mL; Inject 0.5 mLs into the muscle once.  2. Prenatal care in third trimester  - Glucose Tolerance, 1 HR (50g) w/o Fasting - CBC - RPR - HIV antibody (with reflex)  3. Supervision of low-risk pregnancy, second trimester - BTL signed today  4. Tachycardia - EKG reviewed by Dr. Jolayne Panther > no further follow-up indicated   Preterm labor symptoms and general obstetric precautions including but not  limited to vaginal bleeding, contractions, leaking of fluid and fetal movement were reviewed in detail with the patient.  Please refer to After Visit Summary for other counseling recommendations.   Return in about 2 weeks (around 05/23/2015).   Eino Farber Kennith Gain, CNM

## 2015-05-10 LAB — HIV ANTIBODY (ROUTINE TESTING W REFLEX): HIV 1&2 Ab, 4th Generation: NONREACTIVE

## 2015-05-10 LAB — GLUCOSE TOLERANCE, 1 HOUR (50G) W/O FASTING: GLUCOSE 1 HOUR GTT: 137 mg/dL (ref 70–140)

## 2015-05-21 ENCOUNTER — Encounter: Payer: Self-pay | Admitting: *Deleted

## 2015-05-23 ENCOUNTER — Ambulatory Visit (INDEPENDENT_AMBULATORY_CARE_PROVIDER_SITE_OTHER): Payer: Medicaid Other | Admitting: Family Medicine

## 2015-05-23 VITALS — BP 116/73 | HR 95 | Temp 98.2°F | Wt 231.2 lb

## 2015-05-23 DIAGNOSIS — Z3493 Encounter for supervision of normal pregnancy, unspecified, third trimester: Secondary | ICD-10-CM

## 2015-05-23 DIAGNOSIS — O09292 Supervision of pregnancy with other poor reproductive or obstetric history, second trimester: Secondary | ICD-10-CM

## 2015-05-23 LAB — POCT URINALYSIS DIP (DEVICE)
BILIRUBIN URINE: NEGATIVE
GLUCOSE, UA: NEGATIVE mg/dL
Hgb urine dipstick: NEGATIVE
Ketones, ur: NEGATIVE mg/dL
LEUKOCYTES UA: NEGATIVE
NITRITE: NEGATIVE
Protein, ur: NEGATIVE mg/dL
Specific Gravity, Urine: 1.015 (ref 1.005–1.030)
Urobilinogen, UA: 0.2 mg/dL (ref 0.0–1.0)
pH: 7 (ref 5.0–8.0)

## 2015-05-23 NOTE — Progress Notes (Signed)
Breastfeeding tip of the week reviewed. 

## 2015-05-23 NOTE — Progress Notes (Signed)
Subjective:  Kellie Deleon is a 24 y.o. G2P1001 at 125w2d being seen today for ongoing prenatal care.  Patient reports pressure with full bladder.  Contractions: Not present.  Vag. Bleeding: None. Movement: Present. Denies leaking of fluid.   The following portions of the patient's history were reviewed and updated as appropriate: allergies, current medications, past family history, past medical history, past social history, past surgical history and problem list.   Objective:   Filed Vitals:   05/23/15 1022  BP: 116/73  Pulse: 95  Temp: 98.2 F (36.8 C)  Weight: 231 lb 3.2 oz (104.872 kg)    Fetal Status: Fetal Heart Rate (bpm): 136   Movement: Present     General:  Alert, oriented and cooperative. Patient is in no acute distress.  Skin: Skin is warm and dry. No rash noted.   Cardiovascular: Normal heart rate noted  Respiratory: Normal respiratory effort, no problems with respiration noted  Abdomen: Soft, gravid, appropriate for gestational age. Pain/Pressure: Present     Vaginal: Vag. Bleeding: None.    Vag D/C Character: White  Cervix: Not evaluated        Extremities: Normal range of motion.  Edema: Trace  Mental Status: Normal mood and affect. Normal behavior. Normal judgment and thought content.   Urinalysis:      Assessment and Plan:  Pregnancy: G2P1001 at 3425w2d  1. Supervision of low-risk pregnancy, third trimester Normal FHR, fundal height.  Failed 1hr.  Will get 3hr GTT  2. History of pre-eclampsia in prior pregnancy, currently pregnant, second trimester Continue asa    Preterm labor symptoms and general obstetric precautions including but not limited to vaginal bleeding, contractions, leaking of fluid and fetal movement were reviewed in detail with the patient.  Please refer to After Visit Summary for other counseling recommendations.   No Follow-up on file.   Levie HeritageJacob J Stinson, DO

## 2015-05-23 NOTE — Patient Instructions (Signed)
Third Trimester of Pregnancy The third trimester is from week 29 through week 42, months 7 through 9. The third trimester is a time when the fetus is growing rapidly. At the end of the ninth month, the fetus is about 20 inches in length and weighs 6-10 pounds.  BODY CHANGES Your body goes through many changes during pregnancy. The changes vary from woman to woman.   Your weight will continue to increase. You can expect to gain 25-35 pounds (11-16 kg) by the end of the pregnancy.  You may begin to get stretch marks on your hips, abdomen, and breasts.  You may urinate more often because the fetus is moving lower into your pelvis and pressing on your bladder.  You may develop or continue to have heartburn as a result of your pregnancy.  You may develop constipation because certain hormones are causing the muscles that push waste through your intestines to slow down.  You may develop hemorrhoids or swollen, bulging veins (varicose veins).  You may have pelvic pain because of the weight gain and pregnancy hormones relaxing your joints between the bones in your pelvis. Backaches may result from overexertion of the muscles supporting your posture.  You may have changes in your hair. These can include thickening of your hair, rapid growth, and changes in texture. Some women also have hair loss during or after pregnancy, or hair that feels dry or thin. Your hair will most likely return to normal after your baby is born.  Your breasts will continue to grow and be tender. A yellow discharge may leak from your breasts called colostrum.  Your belly button may stick out.  You may feel short of breath because of your expanding uterus.  You may notice the fetus "dropping," or moving lower in your abdomen.  You may have a bloody mucus discharge. This usually occurs a few days to a week before labor begins.  Your cervix becomes thin and soft (effaced) near your due date. WHAT TO EXPECT AT YOUR PRENATAL  EXAMS  You will have prenatal exams every 2 weeks until week 36. Then, you will have weekly prenatal exams. During a routine prenatal visit:  You will be weighed to make sure you and the fetus are growing normally.  Your blood pressure is taken.  Your abdomen will be measured to track your baby's growth.  The fetal heartbeat will be listened to.  Any test results from the previous visit will be discussed.  You may have a cervical check near your due date to see if you have effaced. At around 36 weeks, your caregiver will check your cervix. At the same time, your caregiver will also perform a test on the secretions of the vaginal tissue. This test is to determine if a type of bacteria, Group B streptococcus, is present. Your caregiver will explain this further. Your caregiver may ask you:  What your birth plan is.  How you are feeling.  If you are feeling the baby move.  If you have had any abnormal symptoms, such as leaking fluid, bleeding, severe headaches, or abdominal cramping.  If you have any questions. Other tests or screenings that may be performed during your third trimester include:  Blood tests that check for low iron levels (anemia).  Fetal testing to check the health, activity level, and growth of the fetus. Testing is done if you have certain medical conditions or if there are problems during the pregnancy. FALSE LABOR You may feel small, irregular contractions that   eventually go away. These are called Braxton Hicks contractions, or false labor. Contractions may last for hours, days, or even weeks before true labor sets in. If contractions come at regular intervals, intensify, or become painful, it is best to be seen by your caregiver.  SIGNS OF LABOR   Menstrual-like cramps.  Contractions that are 5 minutes apart or less.  Contractions that start on the top of the uterus and spread down to the lower abdomen and back.  A sense of increased pelvic pressure or back  pain.  A watery or bloody mucus discharge that comes from the vagina. If you have any of these signs before the 37th week of pregnancy, call your caregiver right away. You need to go to the hospital to get checked immediately. HOME CARE INSTRUCTIONS   Avoid all smoking, herbs, alcohol, and unprescribed drugs. These chemicals affect the formation and growth of the baby.  Follow your caregiver's instructions regarding medicine use. There are medicines that are either safe or unsafe to take during pregnancy.  Exercise only as directed by your caregiver. Experiencing uterine cramps is a good sign to stop exercising.  Continue to eat regular, healthy meals.  Wear a good support bra for breast tenderness.  Do not use hot tubs, steam rooms, or saunas.  Wear your seat belt at all times when driving.  Avoid raw meat, uncooked cheese, cat litter boxes, and soil used by cats. These carry germs that can cause birth defects in the baby.  Take your prenatal vitamins.  Try taking a stool softener (if your caregiver approves) if you develop constipation. Eat more high-fiber foods, such as fresh vegetables or fruit and whole grains. Drink plenty of fluids to keep your urine clear or pale yellow.  Take warm sitz baths to soothe any pain or discomfort caused by hemorrhoids. Use hemorrhoid cream if your caregiver approves.  If you develop varicose veins, wear support hose. Elevate your feet for 15 minutes, 3-4 times a day. Limit salt in your diet.  Avoid heavy lifting, wear low heal shoes, and practice good posture.  Rest a lot with your legs elevated if you have leg cramps or low back pain.  Visit your dentist if you have not gone during your pregnancy. Use a soft toothbrush to brush your teeth and be gentle when you floss.  A sexual relationship may be continued unless your caregiver directs you otherwise.  Do not travel far distances unless it is absolutely necessary and only with the approval  of your caregiver.  Take prenatal classes to understand, practice, and ask questions about the labor and delivery.  Make a trial run to the hospital.  Pack your hospital bag.  Prepare the baby's nursery.  Continue to go to all your prenatal visits as directed by your caregiver. SEEK MEDICAL CARE IF:  You are unsure if you are in labor or if your water has broken.  You have dizziness.  You have mild pelvic cramps, pelvic pressure, or nagging pain in your abdominal area.  You have persistent nausea, vomiting, or diarrhea.  You have a bad smelling vaginal discharge.  You have pain with urination. SEEK IMMEDIATE MEDICAL CARE IF:   You have a fever.  You are leaking fluid from your vagina.  You have spotting or bleeding from your vagina.  You have severe abdominal cramping or pain.  You have rapid weight loss or gain.  You have shortness of breath with chest pain.  You notice sudden or extreme swelling   of your face, hands, ankles, feet, or legs.  You have not felt your baby move in over an hour.  You have severe headaches that do not go away with medicine.  You have vision changes. Document Released: 10/27/2001 Document Revised: 11/07/2013 Document Reviewed: 01/03/2013 Exit[employer] Patient Information 2015 [employer]. This information is not intended to replace advice given to you by your health care provider. Make sure you discuss any questions you have with your health care provider.  

## 2015-05-24 ENCOUNTER — Other Ambulatory Visit: Payer: Medicaid Other

## 2015-05-24 DIAGNOSIS — R7309 Other abnormal glucose: Secondary | ICD-10-CM

## 2015-05-25 LAB — GLUCOSE TOLERANCE, 3 HOURS
Glucose Tolerance, 1 hour: 121 mg/dL (ref 70–189)
Glucose Tolerance, 2 hour: 122 mg/dL (ref 70–164)
Glucose Tolerance, Fasting: 82 mg/dL (ref 70–104)
Glucose, GTT - 3 Hour: 99 mg/dL (ref 70–144)

## 2015-06-01 ENCOUNTER — Encounter (HOSPITAL_COMMUNITY): Payer: Self-pay | Admitting: *Deleted

## 2015-06-01 ENCOUNTER — Inpatient Hospital Stay (HOSPITAL_COMMUNITY)
Admission: EM | Admit: 2015-06-01 | Discharge: 2015-06-01 | Disposition: A | Discharge status: Home / Self Care | Payer: Medicaid Other | Source: Ambulatory Visit | Attending: Obstetrics & Gynecology | Admitting: Obstetrics & Gynecology

## 2015-06-01 DIAGNOSIS — Z87891 Personal history of nicotine dependence: Secondary | ICD-10-CM | POA: Insufficient documentation

## 2015-06-01 DIAGNOSIS — Z3A31 31 weeks gestation of pregnancy: Secondary | ICD-10-CM | POA: Diagnosis not present

## 2015-06-01 DIAGNOSIS — O26899 Other specified pregnancy related conditions, unspecified trimester: Secondary | ICD-10-CM

## 2015-06-01 DIAGNOSIS — R1030 Lower abdominal pain, unspecified: Secondary | ICD-10-CM | POA: Diagnosis present

## 2015-06-01 DIAGNOSIS — O9989 Other specified diseases and conditions complicating pregnancy, childbirth and the puerperium: Secondary | ICD-10-CM | POA: Insufficient documentation

## 2015-06-01 DIAGNOSIS — R102 Pelvic and perineal pain: Secondary | ICD-10-CM | POA: Diagnosis not present

## 2015-06-01 DIAGNOSIS — N949 Unspecified condition associated with female genital organs and menstrual cycle: Secondary | ICD-10-CM

## 2015-06-01 LAB — URINALYSIS, ROUTINE W REFLEX MICROSCOPIC
Bilirubin Urine: NEGATIVE
Glucose, UA: NEGATIVE mg/dL
HGB URINE DIPSTICK: NEGATIVE
KETONES UR: NEGATIVE mg/dL
Leukocytes, UA: NEGATIVE
Nitrite: NEGATIVE
PROTEIN: NEGATIVE mg/dL
Specific Gravity, Urine: 1.01 (ref 1.005–1.030)
UROBILINOGEN UA: 0.2 mg/dL (ref 0.0–1.0)
pH: 6.5 (ref 5.0–8.0)

## 2015-06-01 NOTE — MAU Provider Note (Signed)
History  Chief Complaint:  Pelvic Pain  Kellie Deleon is a 24 y.o. 352P1001 female at 518w4d presenting w/ report of constant sharp RLQ/groin pain that began yesterday, worse when sleeping/turning sides, feels better when she lifts abd.   Reports active fetal movement, contractions: none, vaginal bleeding: none, membranes: intact. Denies uti s/s, abnormal/malodorous vag d/c or vulvovaginal itching/irritation.   [facility]  Next visit 7/21. Pregnancy complicated by anxiety, h/o pre-e taking baby asa daily.  Obstetrical History: OB History    Gravida Para Term Preterm AB TAB SAB Ectopic Multiple Living   2 1 1  0 0 0 0 0 0 1      Past Medical History: Past Medical History  Diagnosis Date  . No significant past medical history   . Loss of teeth due to extraction   . PCOS (polycystic ovarian syndrome)   . Anxiety   . Headache(784.0)   . Bronchitis   . Bacterial vaginosis     Pt stated she is prone to BV, "always comes and goes"  . Gestational diabetes 2015  . Pregnancy induced hypertension 2015    Past Surgical History: Past Surgical History  Procedure Laterality Date  . Mouth surgery      Social History: History   Social History  . Marital Status: Married    Spouse Name: N/A  . Number of Children: N/A  . Years of Education: N/A   Social History Main Topics  . Smoking status: Former Smoker    Quit date: 10/27/2012  . Smokeless tobacco: Never Used  . Alcohol Use: No     Comment: Occassional Use  . Drug Use: No     Comment: Marijuana use in 2013  . Sexual Activity: Yes    Birth Control/ Protection: None   Other Topics Concern  . None   Social History Narrative    Allergies: Allergies  Allergen Reactions  . Latex Rash and Other (See Comments)    Reaction:  Burning     Prescriptions prior to admission  Medication Sig Dispense Refill Last Dose  . acetaminophen (TYLENOL) 500 MG tablet Take 1,000 mg by mouth every 6 (six) hours as needed for mild  pain or headache.    Past Week at Unknown time  . aspirin EC 81 MG tablet Take 81 mg by mouth daily.   Past Week at Unknown time  . Prenatal Vit-Fe Fumarate-FA (PRENATAL VITAMINS PLUS) 27-1 MG TABS Take 1 tablet by mouth daily. 30 tablet 11 05/31/2015 at Unknown time  . polyethylene glycol (MIRALAX) packet Take 17 g by mouth daily. (Patient not taking: Reported on 06/01/2015) 14 each 0 Taking    Review of Systems  Pertinent pos/neg as indicated in HPI  Physical Exam  Blood pressure 118/69, pulse 116, temperature 98.1 F (36.7 C), temperature source Oral, resp. rate 18, height 5\' 4"  (1.626 m), weight 104.866 kg (231 lb 3 oz), not currently breastfeeding. General appearance: alert, cooperative and no distress Lungs: clear to auscultation bilaterally, normal effort Heart: regular rate and rhythm Abdomen: gravid, soft, non-tender Extremities: no edema DTR's 2+  Spec exam: n/a Cultures/Specimens: n/a    Fetal monitoring: FHR: 135 bpm, variability: moderate,  Accelerations: Present,  decelerations:  Absent Uterine activity: none  MAU Course  Exam UA  Labs:  Results for orders placed or performed during the hospital encounter of 06/01/15 (from the past 24 hour(s))  Urinalysis, Routine w reflex microscopic (not at [facility])     Status: None   Collection Time:  06/01/15  7:30 PM  Result Value Ref Range   Color, Urine YELLOW YELLOW   APPearance CLEAR CLEAR   Specific Gravity, Urine 1.010 1.005 - 1.030   pH 6.5 5.0 - 8.0   Glucose, UA NEGATIVE NEGATIVE mg/dL   Hgb urine dipstick NEGATIVE NEGATIVE   Bilirubin Urine NEGATIVE NEGATIVE   Ketones, ur NEGATIVE NEGATIVE mg/dL   Protein, ur NEGATIVE NEGATIVE mg/dL   Urobilinogen, UA 0.2 0.0 - 1.0 mg/dL   Nitrite NEGATIVE NEGATIVE   Leukocytes, UA NEGATIVE NEGATIVE    Imaging:  n/a  Assessment and Plan  A:  [redacted]w[redacted]d SIUP  G2P1001  Round ligament pain  Cat 1 FHR P:  D/C home  Reviewed RLP prevention/relief measures, to get pregnancy belt,  warm baths, apap prn  Keep next appt at [facility] on 7/21 as scheduled   Marge Duncans CNM,WHNP-BC 7/16/20169:04 PM

## 2015-06-01 NOTE — MAU Note (Signed)
Patient presents at 6031 weeks gestation with c/o severe pelvic pressure since yesterday morning. Fetus active. Denies bleeding but states that she has the "normal pregnancy discharge".

## 2015-06-01 NOTE — Discharge Instructions (Signed)
Call the clinic [phone]) or go [facility] if:  You begin to have strong, frequent contractions  Your water breaks.  Sometimes it is a big gush of fluid, sometimes it is just a trickle that keeps getting your panties wet or running down your legs  You have vaginal bleeding.  It is normal to have a small amount of spotting if your cervix was checked.   You don't feel your baby moving like normal.  If you don't, get you something to eat and drink and lay down and focus on feeling your baby move.  You should feel at least 10 movements in 2 hours.  If you don't, you should call the office or [facility].    For your lower back/round ligament pain pain you may:  Purchase a pregnancy belt from [employer], [employer], [employer], etc and wear it while you are up and about  Take warm baths  Use a heating pad to your lower back for no longer than 20 minutes at a time, and do not place near abdomen  Take tylenol as needed. Please follow directions on the bottle

## 2015-06-06 ENCOUNTER — Ambulatory Visit (INDEPENDENT_AMBULATORY_CARE_PROVIDER_SITE_OTHER): Payer: Medicaid Other | Admitting: Family

## 2015-06-06 VITALS — BP 130/73 | HR 85 | Wt 231.6 lb

## 2015-06-06 DIAGNOSIS — Z3493 Encounter for supervision of normal pregnancy, unspecified, third trimester: Secondary | ICD-10-CM

## 2015-06-06 MED ORDER — CYCLOBENZAPRINE HCL 10 MG PO TABS: 10.0000 mg | ORAL_TABLET | Freq: Two times a day (BID) | ORAL | Status: DC | PRN

## 2015-06-06 NOTE — Progress Notes (Signed)
Subjective:  Kellie Deleon is a 24 y.o. G2P1001 at [redacted]w[redacted]d being seen today for ongoing prenatal care.  Patient reports right sided abdomen pain with fetal movement.  Denies vaginal bleeding or leaking of fluid.  No contractions reported.  . Also reports occasional headaches that radiates to ear.  Contractions: Not present.  Vag. Bleeding: None. Movement: Present. Denies leaking of fluid.   The following portions of the patient's history were reviewed and updated as appropriate: allergies, current medications, past family history, past medical history, past social history, past surgical history and problem list.   Objective:   Filed Vitals:   06/06/15 1123  BP: 130/73  Pulse: 85  Weight: 231 lb 9.6 oz (105.053 kg)    Fetal Status: Fetal Heart Rate (bpm): 146   Movement: Present     General:  Alert, oriented and cooperative. Patient is in no acute distress.  Skin: Skin is warm and dry. No rash noted.   Cardiovascular: Normal heart rate noted  Respiratory: Normal respiratory effort, no problems with respiration noted  EENT Both ears wnl; TM gray, no redness noted  Abdomen: Soft, gravid, appropriate for gestational age. Pain/Pressure: Absent     Vaginal: Vag. Bleeding: None.       Cervix: Not evaluated        Extremities: Normal range of motion.  Edema: Trace  Mental Status: Normal mood and affect. Normal behavior. Normal judgment and thought content.   Urinalysis:      Assessment and Plan:  Pregnancy: G2P1001 at [redacted]w[redacted]d  1. Supervision of low-risk pregnancy, third trimester - US OB Follow Up; Future  2.  Headaches - RX flexeril  Preterm labor symptoms and general obstetric precautions including but not limited to vaginal bleeding, contractions, leaking of fluid and fetal movement were reviewed in detail with the patient. Please refer to After Visit Summary for other counseling recommendations.   Return in about 2 weeks (around 06/20/2015).   Eino Farber Kennith Gain, CNM

## 2015-06-06 NOTE — Progress Notes (Signed)
Ultrasound for growth scheduled for 06/19/2015 @ 8:15AM

## 2015-06-06 NOTE — Progress Notes (Signed)
Went to MAU for pelvic pain was told it was round ligament.  Pt also reports an earache in left ear.

## 2015-06-19 ENCOUNTER — Ambulatory Visit (HOSPITAL_COMMUNITY)
Admission: RE | Admit: 2015-06-19 | Discharge: 2015-06-19 | Disposition: A | Discharge status: Home / Self Care | Payer: Medicaid Other | Source: Ambulatory Visit | Attending: Family | Admitting: Family

## 2015-06-19 DIAGNOSIS — Z3493 Encounter for supervision of normal pregnancy, unspecified, third trimester: Secondary | ICD-10-CM | POA: Insufficient documentation

## 2015-06-20 ENCOUNTER — Ambulatory Visit (INDEPENDENT_AMBULATORY_CARE_PROVIDER_SITE_OTHER): Payer: Medicaid Other | Admitting: Family

## 2015-06-20 ENCOUNTER — Other Ambulatory Visit (HOSPITAL_COMMUNITY)
Admission: RE | Admit: 2015-06-20 | Discharge: 2015-06-20 | Disposition: A | Discharge status: Home / Self Care | Payer: Medicaid Other | Source: Ambulatory Visit | Attending: Family | Admitting: Family

## 2015-06-20 VITALS — BP 139/76 | HR 88 | Temp 98.3°F | Wt 235.4 lb

## 2015-06-20 DIAGNOSIS — Z3493 Encounter for supervision of normal pregnancy, unspecified, third trimester: Secondary | ICD-10-CM | POA: Diagnosis not present

## 2015-06-20 DIAGNOSIS — L918 Other hypertrophic disorders of the skin: Secondary | ICD-10-CM | POA: Diagnosis present

## 2015-06-20 LAB — POCT URINALYSIS DIP (DEVICE)
BILIRUBIN URINE: NEGATIVE
GLUCOSE, UA: NEGATIVE mg/dL
HGB URINE DIPSTICK: NEGATIVE
Ketones, ur: NEGATIVE mg/dL
Nitrite: NEGATIVE
PH: 7 (ref 5.0–8.0)
Protein, ur: NEGATIVE mg/dL
Specific Gravity, Urine: 1.025 (ref 1.005–1.030)
Urobilinogen, UA: 0.2 mg/dL (ref 0.0–1.0)

## 2015-06-20 NOTE — Progress Notes (Signed)
Edema- hands/feet   Pain/pressure- epigastric area  Educated on baby feeding cues

## 2015-06-20 NOTE — Progress Notes (Signed)
Subjective:  Kellie Deleon is a 24 y.o. G2P1001 at [redacted]w[redacted]d being seen today for ongoing prenatal care.  Patient reports increased pedal and hand swelling.  Pain at upper portion of uterus.  Denies contractions or leaking of fluid. .  Contractions: Not present.  Vag. Bleeding: None. Movement: Present. Denies leaking of fluid.   The following portions of the patient's history were reviewed and updated as appropriate: allergies, current medications, past family history, past medical history, past social history, past surgical history and problem list.   Objective:   Filed Vitals:   06/20/15 1036  BP: 139/76  Pulse: 88  Temp: 98.3 F (36.8 C)  Weight: 235 lb 6.4 oz (106.777 kg)    Fetal Status: Fetal Heart Rate (bpm): 137   Movement: Present     General:  Alert, oriented and cooperative. Patient is in no acute distress.  Skin: Skin is warm and dry. No rash noted.   Cardiovascular: Normal heart rate noted  Respiratory: Normal respiratory effort, no problems with respiration noted  Abdomen: Soft, gravid, appropriate for gestational age. Pain/Pressure: Present     Vaginal: Vag. Bleeding: None.    Vag D/C Character: White  Cervix: Not evaluated        Extremities: Normal range of motion.  Edema: Trace  Mental Status: Normal mood and affect. Normal behavior. Normal judgment and thought content.   Urinalysis:     Urine results not available at discharge.     Assessment and Plan:  Pregnancy: G2P1001 at [redacted]w[redacted]d  1.  Hx of Preeclampsia - Continue baby ASA; close monitoring   2.  Skin Lesion - skin tag removed, sent for biopsy  Preterm labor symptoms and general obstetric precautions including but not limited to vaginal bleeding, contractions, leaking of fluid and fetal movement were reviewed in detail with the patient. Please refer to After Visit Summary for other counseling recommendations.  Return in about 1 week (around 06/27/2015).   Eino Farber Kennith Gain, CNM

## 2015-06-27 ENCOUNTER — Encounter: Payer: Self-pay | Admitting: Family Medicine

## 2015-06-27 ENCOUNTER — Ambulatory Visit (INDEPENDENT_AMBULATORY_CARE_PROVIDER_SITE_OTHER): Payer: Medicaid Other | Admitting: Family Medicine

## 2015-06-27 VITALS — BP 121/78 | HR 102 | Wt 238.6 lb

## 2015-06-27 DIAGNOSIS — Z3493 Encounter for supervision of normal pregnancy, unspecified, third trimester: Secondary | ICD-10-CM

## 2015-06-27 DIAGNOSIS — A630 Anogenital (venereal) warts: Secondary | ICD-10-CM

## 2015-06-27 DIAGNOSIS — O09292 Supervision of pregnancy with other poor reproductive or obstetric history, second trimester: Secondary | ICD-10-CM

## 2015-06-27 LAB — POCT URINALYSIS DIP (DEVICE)
Glucose, UA: NEGATIVE mg/dL
HGB URINE DIPSTICK: NEGATIVE
Nitrite: NEGATIVE
PROTEIN: NEGATIVE mg/dL
Specific Gravity, Urine: 1.02 (ref 1.005–1.030)
Urobilinogen, UA: 0.2 mg/dL (ref 0.0–1.0)
pH: 7 (ref 5.0–8.0)

## 2015-06-27 NOTE — Patient Instructions (Signed)

## 2015-06-27 NOTE — Progress Notes (Signed)
Reviewed breast feeding tip of the week with patient.  

## 2015-06-27 NOTE — Progress Notes (Signed)
Subjective:  Teylor Wolven is a 24 y.o. G2P1001 at [redacted]w[redacted]d being seen today for ongoing prenatal care.  Patient reports no complaints.  Contractions: Not present.  Vag. Bleeding: None. Movement: Present. Denies leaking of fluid.   The following portions of the patient's history were reviewed and updated as appropriate: allergies, current medications, past family history, past medical history, past social history, past surgical history and problem list.   Objective:   Filed Vitals:   06/27/15 1027  BP: 121/78  Pulse: 102  Weight: 238 lb 9.6 oz (108.228 kg)    Fetal Status: Fetal Heart Rate (bpm): 132 Fundal Height: 34 cm Movement: Present     General:  Alert, oriented and cooperative. Patient is in no acute distress.  Skin: Skin is warm and dry. No rash noted.   Cardiovascular: Normal heart rate noted  Respiratory: Normal respiratory effort, no problems with respiration noted  Abdomen: Soft, gravid, appropriate for gestational age. Pain/Pressure: Absent     Pelvic: Vag. Bleeding: None Vag D/C Character: White   Cervical exam deferred        Extremities: Normal range of motion.  Edema: Trace  Mental Status: Normal mood and affect. Normal behavior. Normal judgment and thought content.   Urinalysis:      Assessment and Plan:  Pregnancy: G2P1001 at [redacted]w[redacted]d  1. Supervision of low-risk pregnancy, third trimester Continue routine prenatal care. Cultures next week.  2. History of pre-eclampsia in prior pregnancy, currently pregnant, second trimester BP is stable today  3. Condyloma acuminata On path  Term labor symptoms and general obstetric precautions including but not limited to vaginal bleeding, contractions, leaking of fluid and fetal movement were reviewed in detail with the patient. Please refer to After Visit Summary for other counseling recommendations.   Return in about 1 week (around 07/04/2015) for [facility].  Reva Bores, MD

## 2015-07-04 ENCOUNTER — Other Ambulatory Visit: Payer: Self-pay | Admitting: Obstetrics & Gynecology

## 2015-07-04 ENCOUNTER — Ambulatory Visit (INDEPENDENT_AMBULATORY_CARE_PROVIDER_SITE_OTHER): Payer: Medicaid Other | Admitting: Obstetrics & Gynecology

## 2015-07-04 VITALS — BP 123/81 | HR 92 | Temp 98.3°F | Wt 237.1 lb

## 2015-07-04 DIAGNOSIS — O09293 Supervision of pregnancy with other poor reproductive or obstetric history, third trimester: Secondary | ICD-10-CM

## 2015-07-04 DIAGNOSIS — Z3493 Encounter for supervision of normal pregnancy, unspecified, third trimester: Secondary | ICD-10-CM

## 2015-07-04 LAB — POCT URINALYSIS DIP (DEVICE)
Glucose, UA: NEGATIVE mg/dL
HGB URINE DIPSTICK: NEGATIVE
Ketones, ur: NEGATIVE mg/dL
LEUKOCYTES UA: NEGATIVE
NITRITE: NEGATIVE
PH: 6.5 (ref 5.0–8.0)
Protein, ur: 100 mg/dL — AB
Specific Gravity, Urine: 1.025 (ref 1.005–1.030)
UROBILINOGEN UA: 0.2 mg/dL (ref 0.0–1.0)

## 2015-07-04 LAB — OB RESULTS CONSOLE GBS: STREP GROUP B AG: POSITIVE

## 2015-07-04 NOTE — Progress Notes (Signed)
Subjective:  Kellie Deleon is a 24 y.o. G2P1001 at [redacted]w[redacted]d being seen today for ongoing prenatal care.  Patient reports no complaints.  Contractions: Irritability.  Vag. Bleeding: None. Movement: Present. Denies leaking of fluid.   The following portions of the patient's history were reviewed and updated as appropriate: allergies, current medications, past family history, past medical history, past social history, past surgical history and problem list.   Objective:   Filed Vitals:   07/04/15 1024  BP: 123/81  Pulse: 92  Temp: 98.3 F (36.8 C)  Weight: 237 lb 1.6 oz (107.548 kg)    Fetal Status: Fetal Heart Rate (bpm): 150 Fundal Height: 37 cm Movement: Present  Presentation: Vertex  General:  Alert, oriented and cooperative. Patient is in no acute distress.  Skin: Skin is warm and dry. No rash noted.   Cardiovascular: Normal heart rate noted  Respiratory: Normal respiratory effort, no problems with respiration noted  Abdomen: Soft, gravid, appropriate for gestational age. Pain/Pressure: Present     Pelvic: Vag. Bleeding: None Vag D/C Character: White   Cervical exam deferred Dilation: 1 Effacement (%): 50 Station: -3  Extremities: Normal range of motion.  Edema: Trace  Mental Status: Normal mood and affect. Normal behavior. Normal judgment and thought content.   Urinalysis: Urine Protein: Negative Urine Glucose: Negative  Assessment and Plan:  Pregnancy: G2P1001 at [redacted]w[redacted]d  1. History of pre-eclampsia in prior pregnancy, currently pregnant, third trimester Stable BP.  Continue ASA. - Culture, beta strep (group b only) - GC/Chlamydia Probe Amp  2. Supervision of low-risk pregnancy, third trimester Preterm labor symptoms and general obstetric precautions including but not limited to vaginal bleeding, contractions, leaking of fluid and fetal movement were reviewed in detail with the patient. Please refer to After Visit Summary for other counseling recommendations.  Return in about  1 week (around 07/11/2015) for OB Visit.   Tereso Newcomer, MD

## 2015-07-04 NOTE — Patient Instructions (Signed)
Return to clinic for any obstetric concerns or go to MAU for evaluation  

## 2015-07-05 LAB — GC/CHLAMYDIA PROBE AMP
CT Probe RNA: NEGATIVE
GC Probe RNA: NEGATIVE

## 2015-07-06 LAB — CULTURE, BETA STREP (GROUP B ONLY)

## 2015-07-09 ENCOUNTER — Encounter (HOSPITAL_COMMUNITY): Payer: Self-pay | Admitting: *Deleted

## 2015-07-09 ENCOUNTER — Inpatient Hospital Stay (HOSPITAL_COMMUNITY)
Admission: AD | Admit: 2015-07-09 | Discharge: 2015-07-09 | Disposition: A | Discharge status: Home / Self Care | Payer: Medicaid Other | Source: Ambulatory Visit | Attending: Obstetrics & Gynecology | Admitting: Obstetrics & Gynecology

## 2015-07-09 DIAGNOSIS — Z79899 Other long term (current) drug therapy: Secondary | ICD-10-CM | POA: Diagnosis not present

## 2015-07-09 DIAGNOSIS — Z3A37 37 weeks gestation of pregnancy: Secondary | ICD-10-CM | POA: Insufficient documentation

## 2015-07-09 DIAGNOSIS — R197 Diarrhea, unspecified: Secondary | ICD-10-CM | POA: Diagnosis not present

## 2015-07-09 DIAGNOSIS — O1203 Gestational edema, third trimester: Secondary | ICD-10-CM

## 2015-07-09 DIAGNOSIS — O26893 Other specified pregnancy related conditions, third trimester: Secondary | ICD-10-CM | POA: Insufficient documentation

## 2015-07-09 DIAGNOSIS — O1200 Gestational edema, unspecified trimester: Secondary | ICD-10-CM

## 2015-07-09 DIAGNOSIS — E282 Polycystic ovarian syndrome: Secondary | ICD-10-CM | POA: Insufficient documentation

## 2015-07-09 LAB — COMPREHENSIVE METABOLIC PANEL
ALT: 11 U/L — AB (ref 14–54)
AST: 16 U/L (ref 15–41)
Albumin: 3.4 g/dL — ABNORMAL LOW (ref 3.5–5.0)
Alkaline Phosphatase: 156 U/L — ABNORMAL HIGH (ref 38–126)
Anion gap: 9 (ref 5–15)
BUN: 7 mg/dL (ref 6–20)
CHLORIDE: 107 mmol/L (ref 101–111)
CO2: 22 mmol/L (ref 22–32)
CREATININE: 0.65 mg/dL (ref 0.44–1.00)
Calcium: 9.1 mg/dL (ref 8.9–10.3)
GFR calc Af Amer: >60 mL/min (ref >60)
GFR calc non Af Amer: >60 mL/min (ref >60)
Glucose, Bld: 73 mg/dL (ref 65–99)
Potassium: 4.1 mmol/L (ref 3.5–5.1)
SODIUM: 138 mmol/L (ref 135–145)
Total Bilirubin: 0.6 mg/dL (ref 0.3–1.2)
Total Protein: 6.6 g/dL (ref 6.5–8.1)

## 2015-07-09 LAB — URINALYSIS, ROUTINE W REFLEX MICROSCOPIC
BILIRUBIN URINE: NEGATIVE
Glucose, UA: NEGATIVE mg/dL
Hgb urine dipstick: NEGATIVE
Ketones, ur: NEGATIVE mg/dL
LEUKOCYTES UA: NEGATIVE
NITRITE: NEGATIVE
Protein, ur: NEGATIVE mg/dL
SPECIFIC GRAVITY, URINE: 1.02 (ref 1.005–1.030)
UROBILINOGEN UA: 0.2 mg/dL (ref 0.0–1.0)
pH: 7.5 (ref 5.0–8.0)

## 2015-07-09 LAB — CBC
HCT: 38.7 % (ref 36.0–46.0)
Hemoglobin: 12.8 g/dL (ref 12.0–15.0)
MCH: 31 pg (ref 26.0–34.0)
MCHC: 33.1 g/dL (ref 30.0–36.0)
MCV: 93.7 fL (ref 78.0–100.0)
PLATELETS: 179 10*3/uL (ref 150–400)
RBC: 4.13 MIL/uL (ref 3.87–5.11)
RDW: 13 % (ref 11.5–15.5)
WBC: 11.6 10*3/uL — AB (ref 4.0–10.5)

## 2015-07-09 LAB — PROTEIN / CREATININE RATIO, URINE
Creatinine, Urine: 149 mg/dL
Protein Creatinine Ratio: 0.15 mg/mg{Cre} (ref 0.00–0.15)
Total Protein, Urine: 23 mg/dL

## 2015-07-09 NOTE — Discharge Instructions (Signed)
If your notice that your blood pressure persistently stays above 140 (the top number) or 90 (the bottom number), seek medical attention that same day. Also seek medical attention if you develop headache that won't go away, vision changes, pain in your upper right side, or difficulty breathing.

## 2015-07-09 NOTE — MAU Provider Note (Signed)
MAU HISTORY AND PHYSICAL  Chief Complaint:  Diarrhea   Kellie Deleon is a 24 y.o.  G2P1001 with IUP at [redacted]w[redacted]d presenting for diarrhea and elevated BP. Patient has hx preeclampsia. She noted a BP today of diastolic 93. She has also had foot and hand swelling. She has had two days watery diarrhea, no blood or mucous. No nausea or vomiting, no fevers, no sick contacts, tolerating liquids. Denies headache, vision change, ruq pain, or difficulty breathing. NO recent hospitalization or antibiotics. No vaginal bleeding, no contractions, no leakage of fluid. Feeling baby move normally.  Menstrual History: OB History    Gravida Para Term Preterm AB TAB SAB Ectopic Multiple Living   0 0 0 0 0 0 1      No LMP recorded. Patient is pregnant.      Past Medical History  Diagnosis Date  . No significant past medical history   . Loss of teeth due to extraction   . PCOS (polycystic ovarian syndrome)   . Anxiety   . Headache(784.0)   . Bronchitis   . Bacterial vaginosis     Pt stated she is prone to BV, "always comes and goes"  . Gestational diabetes 2015  . Pregnancy induced hypertension 2015    Past Surgical History  Procedure Laterality Date  . Mouth surgery      Family History  Problem Relation Age of Onset  . Cancer Other   . Diabetes Maternal Grandmother   . Hypertension Maternal Grandmother   . Cancer Maternal Grandfather     Social History  Substance Use Topics  . Smoking status: Former Smoker    Quit date: 10/27/2012  . Smokeless tobacco: Never Used  . Alcohol Use: No     Comment: Occassional Use      Allergies  Allergen Reactions  . Latex Rash and Other (See Comments)    Reaction:  Burning     Prescriptions prior to admission  Medication Sig Dispense Refill Last Dose  . acetaminophen (TYLENOL) 500 MG tablet Take 1,000 mg by mouth every 6 (six) hours as needed for mild pain or headache.    prn at Unknown time  . aspirin EC 81 MG tablet Take 81 mg by mouth  daily.   07/08/2015 at Unknown time  . pantoprazole (PROTONIX) 40 MG tablet Take 40 mg by mouth daily.   07/08/2015 at Unknown time  . Prenatal Vit-Fe Fumarate-FA (PRENATAL VITAMINS PLUS) 27-1 MG TABS Take 1 tablet by mouth daily. 30 tablet 11 07/08/2015 at Unknown time  . cyclobenzaprine (FLEXERIL) 10 MG tablet Take 1 tablet (10 mg total) by mouth 2 (two) times daily as needed (heaaches). (Patient not taking: Reported on 07/09/2015) 30 tablet 0 Taking  . polyethylene glycol (MIRALAX) packet Take 17 g by mouth daily. (Patient not taking: Reported on 06/27/2015) 14 each 0 Not Taking    Review of Systems - Negative except for what is mentioned in HPI.  Physical Exam  Blood pressure 117/67, pulse 88, temperature 98.1 F (36.7 C), temperature source Oral, resp. rate 16, not currently breastfeeding. GENERAL: Well-developed, well-nourished female in no acute distress.  LUNGS: Clear to auscultation bilaterally.  HEART: Regular rate and rhythm. ABDOMEN: Soft, nontender, nondistended, gravid.  EXTREMITIES: Nontender, no edema, 2+ distal pulses. Cervical Exam: deferred FHT:  125/mod/+a/-d Contractions: quiet   Labs: Results for orders placed or performed during the hospital encounter of 07/09/15 (from the past 24 hour(s))  Urinalysis, Routine w reflex microscopic (not at [facility])  Collection Time: 07/09/15  1:00 PM  Result Value Ref Range   Color, Urine YELLOW YELLOW   APPearance CLEAR CLEAR   Specific Gravity, Urine 1.020 1.005 - 1.030   pH 7.5 5.0 - 8.0   Glucose, UA NEGATIVE NEGATIVE mg/dL   Hgb urine dipstick NEGATIVE NEGATIVE   Bilirubin Urine NEGATIVE NEGATIVE   Ketones, ur NEGATIVE NEGATIVE mg/dL   Protein, ur NEGATIVE NEGATIVE mg/dL   Urobilinogen, UA 0.2 0.0 - 1.0 mg/dL   Nitrite NEGATIVE NEGATIVE   Leukocytes, UA NEGATIVE NEGATIVE  Protein / creatinine ratio, urine   Collection Time: 07/09/15  1:00 PM  Result Value Ref Range   Creatinine, Urine 149.00 mg/dL   Total Protein, Urine  23 mg/dL   Protein Creatinine Ratio 0.15 0.00 - 0.15 mg/mg[Cre]  CBC   Collection Time: 07/09/15  2:58 PM  Result Value Ref Range   WBC 11.6 (H) 4.0 - 10.5 K/uL   RBC 4.13 3.87 - 5.11 MIL/uL   Hemoglobin 12.8 12.0 - 15.0 g/dL   HCT 45.4 09.8 - 11.9 %   MCV 93.7 78.0 - 100.0 fL   MCH 31.0 26.0 - 34.0 pg   MCHC 33.1 30.0 - 36.0 g/dL   RDW 14.7 82.9 - 56.2 %   Platelets 179 150 - 400 K/uL  Comprehensive metabolic panel   Collection Time: 07/09/15  2:58 PM  Result Value Ref Range   Sodium 138 135 - 145 mmol/L   Potassium 4.1 3.5 - 5.1 mmol/L   Chloride 107 101 - 111 mmol/L   CO2 22 22 - 32 mmol/L   Glucose, Bld 73 65 - 99 mg/dL   BUN 7 6 - 20 mg/dL   Creatinine, Ser 1.30 0.44 - 1.00 mg/dL   Calcium 9.1 8.9 - 86.5 mg/dL   Total Protein 6.6 6.5 - 8.1 g/dL   Albumin 3.4 (L) 3.5 - 5.0 g/dL   AST 16 15 - 41 U/L   ALT 11 (L) 14 - 54 U/L   Alkaline Phosphatase 156 (H) 38 - 126 U/L   Total Bilirubin 0.6 0.3 - 1.2 mg/dL   GFR calc non Af Amer >60 >60 mL/min   GFR calc Af Amer >60 >60 mL/min   Anion gap 9 5 - 15    Imaging Studies:  US Ob Follow Up  06/19/2015   OBSTETRICAL ULTRASOUND: This exam was performed within a [place] [facility]. The OB US report was generated in the AS system, and faxed to the ordering physician.   This report is available in the [employer]. See the AS Obstetric US report via the Image Link.   Assessment: Kellie Deleon is  24 y.o. G2P1001 at [redacted]w[redacted]d presents with diarrhea and concern for preeclampsia.  Plan:  # Diarrhea: present for two days, no blood or mucous or fever or significant abdominal pain, tolerating PO, no signs dehydration. Afebrile, no significant leukocytosis. This likely viral, instructed to push fluids, monitor for blood, fever, worsening abdominal pain, or failure to resolve in the next few days.  # Elevated BP: One elevated reading at home. Here monitored repeatedly over course of 3 hours, had one mildly elevated diastolic  and was otherwise wnl. No preeclampsia symptoms, and preeclampsia labs all unremarkable. - stop daily aspirin as is at 37 weeks - instructed in proper BP recording technique; if persistently elevated to seek medical attention right away - preeclampsia return precautions - kick counts - OB f/u as planned  Silvano Bilis 8/23/20164:03 PM

## 2015-07-09 NOTE — MAU Note (Addendum)
Pt C/O swelling in feet since Friday, BP 133/91 in [facility] yesterday, has not been checked today.  Started having green diarrhea - 4 stools in last 24 hours. Pt denies pain or bleeding, states she has had wet underwear since her cervical exam last week.

## 2015-07-15 ENCOUNTER — Encounter: Payer: Self-pay | Admitting: Obstetrics and Gynecology

## 2015-07-15 ENCOUNTER — Ambulatory Visit (INDEPENDENT_AMBULATORY_CARE_PROVIDER_SITE_OTHER): Payer: Medicaid Other | Admitting: Obstetrics and Gynecology

## 2015-07-15 VITALS — BP 129/79 | HR 92 | Temp 97.9°F | Wt 242.5 lb

## 2015-07-15 DIAGNOSIS — O09293 Supervision of pregnancy with other poor reproductive or obstetric history, third trimester: Secondary | ICD-10-CM

## 2015-07-15 DIAGNOSIS — Z3493 Encounter for supervision of normal pregnancy, unspecified, third trimester: Secondary | ICD-10-CM

## 2015-07-15 LAB — POCT URINALYSIS DIP (DEVICE)
Bilirubin Urine: NEGATIVE
Glucose, UA: NEGATIVE mg/dL
KETONES UR: NEGATIVE mg/dL
Leukocytes, UA: NEGATIVE
Nitrite: NEGATIVE
PH: 7 (ref 5.0–8.0)
PROTEIN: NEGATIVE mg/dL
SPECIFIC GRAVITY, URINE: 1.02 (ref 1.005–1.030)
UROBILINOGEN UA: 0.2 mg/dL (ref 0.0–1.0)

## 2015-07-15 NOTE — Progress Notes (Signed)
Subjective:  Kellie Deleon is a 24 y.o. G2P1001 at [redacted]w[redacted]d being seen today for ongoing prenatal care.  Patient reports no complaints.  Contractions: Irritability.  Vag. Bleeding: None. Movement: Present. Denies leaking of fluid.   The following portions of the patient's history were reviewed and updated as appropriate: allergies, current medications, past family history, past medical history, past social history, past surgical history and problem list.   Objective:   Filed Vitals:   07/15/15 0920  BP: 129/79  Pulse: 92  Temp: 97.9 F (36.6 C)  Weight: 242 lb 8 oz (109.997 kg)    Fetal Status: Fetal Heart Rate (bpm): 133 Fundal Height: 37 cm Movement: Present     General:  Alert, oriented and cooperative. Patient is in no acute distress.  Skin: Skin is warm and dry. No rash noted.   Cardiovascular: Normal heart rate noted  Respiratory: Normal respiratory effort, no problems with respiration noted  Abdomen: Soft, gravid, appropriate for gestational age. Pain/Pressure: Present     Pelvic: Vag. Bleeding: None Vag D/C Character: White   Cervical exam deferred        Extremities: Normal range of motion.  Edema: Mild pitting, slight indentation  Mental Status: Normal mood and affect. Normal behavior. Normal judgment and thought content.   Urinalysis: Urine Protein: Negative Urine Glucose: Negative  Assessment and Plan:  Pregnancy: G2P1001 at [redacted]w[redacted]d  1. Supervision of low-risk pregnancy, third trimester Patient is doing well without complaints   2. History of pre-eclampsia in prior pregnancy, currently pregnant, third trimester Continue ASA  Term labor symptoms and general obstetric precautions including but not limited to vaginal bleeding, contractions, leaking of fluid and fetal movement were reviewed in detail with the patient. Please refer to After Visit Summary for other counseling recommendations.  Return in about 1 week (around 07/22/2015).   Catalina Antigua, MD

## 2015-07-15 NOTE — Progress Notes (Signed)
Edema- hands/feet  

## 2015-07-16 ENCOUNTER — Encounter: Payer: Self-pay | Admitting: Obstetrics & Gynecology

## 2015-07-16 DIAGNOSIS — O99820 Streptococcus B carrier state complicating pregnancy: Secondary | ICD-10-CM | POA: Insufficient documentation

## 2015-07-18 ENCOUNTER — Encounter (HOSPITAL_COMMUNITY): Payer: Self-pay | Admitting: *Deleted

## 2015-07-18 ENCOUNTER — Inpatient Hospital Stay (HOSPITAL_COMMUNITY)
Admission: AD | Admit: 2015-07-18 | Discharge: 2015-07-18 | Disposition: A | Discharge status: Home / Self Care | Payer: Medicaid Other | Source: Ambulatory Visit | Attending: Obstetrics & Gynecology | Admitting: Obstetrics & Gynecology

## 2015-07-18 DIAGNOSIS — Z87891 Personal history of nicotine dependence: Secondary | ICD-10-CM | POA: Insufficient documentation

## 2015-07-18 DIAGNOSIS — O1203 Gestational edema, third trimester: Secondary | ICD-10-CM | POA: Insufficient documentation

## 2015-07-18 DIAGNOSIS — Z3A38 38 weeks gestation of pregnancy: Secondary | ICD-10-CM | POA: Insufficient documentation

## 2015-07-18 DIAGNOSIS — O99820 Streptococcus B carrier state complicating pregnancy: Secondary | ICD-10-CM

## 2015-07-18 DIAGNOSIS — O09293 Supervision of pregnancy with other poor reproductive or obstetric history, third trimester: Secondary | ICD-10-CM

## 2015-07-18 DIAGNOSIS — O169 Unspecified maternal hypertension, unspecified trimester: Secondary | ICD-10-CM | POA: Diagnosis not present

## 2015-07-18 LAB — CBC
HCT: 36.7 % (ref 36.0–46.0)
Hemoglobin: 12.3 g/dL (ref 12.0–15.0)
MCH: 31.3 pg (ref 26.0–34.0)
MCHC: 33.5 g/dL (ref 30.0–36.0)
MCV: 93.4 fL (ref 78.0–100.0)
PLATELETS: 165 10*3/uL (ref 150–400)
RBC: 3.93 MIL/uL (ref 3.87–5.11)
RDW: 13.2 % (ref 11.5–15.5)
WBC: 9.8 10*3/uL (ref 4.0–10.5)

## 2015-07-18 LAB — COMPREHENSIVE METABOLIC PANEL
ALK PHOS: 158 U/L — AB (ref 38–126)
ALT: 10 U/L — ABNORMAL LOW (ref 14–54)
ANION GAP: 7 (ref 5–15)
AST: 17 U/L (ref 15–41)
Albumin: 3.1 g/dL — ABNORMAL LOW (ref 3.5–5.0)
BUN: 8 mg/dL (ref 6–20)
CALCIUM: 8.8 mg/dL — AB (ref 8.9–10.3)
CHLORIDE: 104 mmol/L (ref 101–111)
CO2: 23 mmol/L (ref 22–32)
CREATININE: 0.74 mg/dL (ref 0.44–1.00)
Glucose, Bld: 88 mg/dL (ref 65–99)
Potassium: 3.8 mmol/L (ref 3.5–5.1)
SODIUM: 134 mmol/L — AB (ref 135–145)
Total Bilirubin: 0.6 mg/dL (ref 0.3–1.2)
Total Protein: 6.1 g/dL — ABNORMAL LOW (ref 6.5–8.1)

## 2015-07-18 LAB — URINALYSIS, ROUTINE W REFLEX MICROSCOPIC
BILIRUBIN URINE: NEGATIVE
Glucose, UA: NEGATIVE mg/dL
Hgb urine dipstick: NEGATIVE
KETONES UR: NEGATIVE mg/dL
NITRITE: NEGATIVE
PROTEIN: NEGATIVE mg/dL
Specific Gravity, Urine: 1.02 (ref 1.005–1.030)
UROBILINOGEN UA: 0.2 mg/dL (ref 0.0–1.0)
pH: 7 (ref 5.0–8.0)

## 2015-07-18 LAB — PROTEIN / CREATININE RATIO, URINE
CREATININE, URINE: 213 mg/dL
PROTEIN CREATININE RATIO: 0.15 mg/mg{creat} (ref 0.00–0.15)
TOTAL PROTEIN, URINE: 32 mg/dL

## 2015-07-18 LAB — URINE MICROSCOPIC-ADD ON

## 2015-07-18 MED ORDER — ACETAMINOPHEN 500 MG PO TABS
1000.0000 mg | ORAL_TABLET | Freq: Once | ORAL | Status: AC
  Administered 2015-07-18: 1000 mg via ORAL
  Filled 2015-07-18: qty 2

## 2015-07-18 NOTE — MAU Note (Signed)
Increased swelling in hands and feet.  Checked BP at [facility] 156/108. Slight headache, denies visual changes or epigastric pain.

## 2015-07-18 NOTE — Discharge Instructions (Signed)
Continue to check your blood pressure, and seek medical attention for a blood pressure greater than 140 (top number) or 90 (bottom number) that is persistent. Also seek medical attention for severe headache, vision changes, pain in your upper right side, trouble bleeding, or for any other significant concern.

## 2015-07-18 NOTE — MAU Provider Note (Signed)
MAU HISTORY AND PHYSICAL  Chief Complaint:  Concern for preeclampsia  Kellie Deleon is a 24 y.o.  G2P1001 with IUP at [redacted]w[redacted]d presenting for the above.  Patient has hx preeclampsia in previous pregnancy and has been compliant with daily aspirin. She reports one elevted BP at a clinic visit a few weeks ago, otherwise BPs wnl. She reports that she checked her BP at a drug store earlier today and found it to be elevated to a systolic in the 150s. She reports on-and-off headache throughout pregnancy, presente today, mild and unchanged from priors. No vision change, no SOB, no ruq pain. Does report finger and foot swelling.   Positive fetal movement, has had a few weeks of intermittent mild contractions, no leakage of fluid or vaginal bleeding.  Menstrual History: OB History    Gravida Para Term Preterm AB TAB SAB Ectopic Multiple Living   0 0 0 0 0 0 1      No LMP recorded. Patient is pregnant.      Past Medical History  Diagnosis Date  . No significant past medical history   . Loss of teeth due to extraction   . PCOS (polycystic ovarian syndrome)   . Anxiety   . Headache(784.0)   . Bronchitis   . Bacterial vaginosis     Pt stated she is prone to BV, "always comes and goes"  . Gestational diabetes 2015  . Pregnancy induced hypertension 2015    Past Surgical History  Procedure Laterality Date  . Mouth surgery      Family History  Problem Relation Age of Onset  . Cancer Other   . Diabetes Maternal Grandmother   . Hypertension Maternal Grandmother   . Cancer Maternal Grandfather     Social History  Substance Use Topics  . Smoking status: Former Smoker    Quit date: 10/27/2012  . Smokeless tobacco: Never Used  . Alcohol Use: No     Comment: Occassional Use      Allergies  Allergen Reactions  . Latex Rash and Other (See Comments)    Reaction:  Burning     No prescriptions prior to admission    Review of Systems - Negative except for what is mentioned in  HPI.  Physical Exam  Blood pressure 131/86, pulse 78, temperature 98.3 F (36.8 C), temperature source Oral, resp. rate 18, not currently breastfeeding. GENERAL: Well-developed, well-nourished female in no acute distress.  LUNGS: Clear to auscultation bilaterally.  HEART: Regular rate and rhythm. ABDOMEN: Soft, nontender, nondistended, gravid.  EXTREMITIES: Nontender, edema of feet and fingers, 2+ distal pulses. Neuro: DTRs 1+ FHT:  130/mod/+a/-d Contractions: infrequent and irreguular   Labs: Results for orders placed or performed during the hospital encounter of 07/18/15 (from the past 24 hour(s))  Urinalysis, Routine w reflex microscopic (not at [facility])   Collection Time: 07/18/15  1:15 PM  Result Value Ref Range   Color, Urine YELLOW YELLOW   APPearance HAZY (A) CLEAR   Specific Gravity, Urine 1.020 1.005 - 1.030   pH 7.0 5.0 - 8.0   Glucose, UA NEGATIVE NEGATIVE mg/dL   Hgb urine dipstick NEGATIVE NEGATIVE   Bilirubin Urine NEGATIVE NEGATIVE   Ketones, ur NEGATIVE NEGATIVE mg/dL   Protein, ur NEGATIVE NEGATIVE mg/dL   Urobilinogen, UA 0.2 0.0 - 1.0 mg/dL   Nitrite NEGATIVE NEGATIVE   Leukocytes, UA SMALL (A) NEGATIVE  Urine microscopic-add on   Collection Time: 07/18/15  1:15 PM  Result Value Ref Range  Squamous Epithelial / LPF MANY (A) RARE   WBC, UA 7-10 <3 WBC/hpf   Bacteria, UA FEW (A) RARE   Urine-Other MUCOUS PRESENT   Protein / creatinine ratio, urine   Collection Time: 07/18/15  1:15 PM  Result Value Ref Range   Creatinine, Urine 213.00 mg/dL   Total Protein, Urine 32 mg/dL   Protein Creatinine Ratio 0.15 0.00 - 0.15 mg/mg[Cre]  CBC   Collection Time: 07/18/15  2:38 PM  Result Value Ref Range   WBC 9.8 4.0 - 10.5 K/uL   RBC 3.93 3.87 - 5.11 MIL/uL   Hemoglobin 12.3 12.0 - 15.0 g/dL   HCT 09.8 11.9 - 14.7 %   MCV 93.4 78.0 - 100.0 fL   MCH 31.3 26.0 - 34.0 pg   MCHC 33.5 30.0 - 36.0 g/dL   RDW 82.9 56.2 - 13.0 %   Platelets 165 150 - 400 K/uL   Comprehensive metabolic panel   Collection Time: 07/18/15  2:38 PM  Result Value Ref Range   Sodium 134 (L) 135 - 145 mmol/L   Potassium 3.8 3.5 - 5.1 mmol/L   Chloride 104 101 - 111 mmol/L   CO2 23 22 - 32 mmol/L   Glucose, Bld 88 65 - 99 mg/dL   BUN 8 6 - 20 mg/dL   Creatinine, Ser 8.65 0.44 - 1.00 mg/dL   Calcium 8.8 (L) 8.9 - 10.3 mg/dL   Total Protein 6.1 (L) 6.5 - 8.1 g/dL   Albumin 3.1 (L) 3.5 - 5.0 g/dL   AST 17 15 - 41 U/L   ALT 10 (L) 14 - 54 U/L   Alkaline Phosphatase 158 (H) 38 - 126 U/L   Total Bilirubin 0.6 0.3 - 1.2 mg/dL   GFR calc non Af Amer >60 >60 mL/min   GFR calc Af Amer >60 >60 mL/min   Anion gap 7 5 - 15    Imaging Studies:  US Ob Follow Up  06/19/2015   OBSTETRICAL ULTRASOUND: This exam was performed within a [place] Ultrasound Department. The OB US report was generated in the AS system, and faxed to the ordering physician.   This report is available in the [employer]. See the AS Obstetric US report via the Image Link.   Assessment: Kellie Deleon is  24 y.o. G2P1001 at [redacted]w[redacted]d presents with concern for preeclampsia. Here initial systolic 141; all subsequent BPs have been wnl and was monitored for 4 hours. We gave tylenol for headache and headache completely resolved. Otherwise no preeclampsia symptoms. Preeclampsia laboratory w/u negative. On 4 hours fetal monitoring there was one early decel that may have been maternal heart rate. AFter that no decels, moderate variability, and frequent accels, so overall reassuring FHTs.  Plan: - d/c home with preeclampsia and labor return precautions - continue aspirin - daily kick counts - OB f/u one week as planned  Silvano Bilis 9/1/20165:56 PM

## 2015-07-24 ENCOUNTER — Inpatient Hospital Stay (HOSPITAL_COMMUNITY): Payer: Medicaid Other | Admitting: Anesthesiology

## 2015-07-24 ENCOUNTER — Encounter (HOSPITAL_COMMUNITY): Payer: Self-pay | Admitting: *Deleted

## 2015-07-24 ENCOUNTER — Inpatient Hospital Stay (HOSPITAL_COMMUNITY)
Admission: AD | Admit: 2015-07-24 | Discharge: 2015-07-26 | DRG: 767 | Disposition: A | Discharge status: Home / Self Care | Payer: Medicaid Other | Source: Ambulatory Visit | Attending: Family Medicine | Admitting: Family Medicine

## 2015-07-24 DIAGNOSIS — R609 Edema, unspecified: Secondary | ICD-10-CM | POA: Diagnosis present

## 2015-07-24 DIAGNOSIS — O09293 Supervision of pregnancy with other poor reproductive or obstetric history, third trimester: Secondary | ICD-10-CM

## 2015-07-24 DIAGNOSIS — O9952 Diseases of the respiratory system complicating childbirth: Secondary | ICD-10-CM | POA: Diagnosis present

## 2015-07-24 DIAGNOSIS — K219 Gastro-esophageal reflux disease without esophagitis: Secondary | ICD-10-CM | POA: Diagnosis present

## 2015-07-24 DIAGNOSIS — O4292 Full-term premature rupture of membranes, unspecified as to length of time between rupture and onset of labor: Principal | ICD-10-CM | POA: Diagnosis present

## 2015-07-24 DIAGNOSIS — O99344 Other mental disorders complicating childbirth: Secondary | ICD-10-CM | POA: Diagnosis present

## 2015-07-24 DIAGNOSIS — O09299 Supervision of pregnancy with other poor reproductive or obstetric history, unspecified trimester: Secondary | ICD-10-CM

## 2015-07-24 DIAGNOSIS — Z302 Encounter for sterilization: Secondary | ICD-10-CM

## 2015-07-24 DIAGNOSIS — E282 Polycystic ovarian syndrome: Secondary | ICD-10-CM | POA: Diagnosis present

## 2015-07-24 DIAGNOSIS — O9962 Diseases of the digestive system complicating childbirth: Secondary | ICD-10-CM | POA: Diagnosis present

## 2015-07-24 DIAGNOSIS — O99324 Drug use complicating childbirth: Secondary | ICD-10-CM | POA: Diagnosis present

## 2015-07-24 DIAGNOSIS — F329 Major depressive disorder, single episode, unspecified: Secondary | ICD-10-CM | POA: Diagnosis present

## 2015-07-24 DIAGNOSIS — O139 Gestational [pregnancy-induced] hypertension without significant proteinuria, unspecified trimester: Secondary | ICD-10-CM | POA: Diagnosis present

## 2015-07-24 DIAGNOSIS — Z809 Family history of malignant neoplasm, unspecified: Secondary | ICD-10-CM | POA: Diagnosis not present

## 2015-07-24 DIAGNOSIS — Z87891 Personal history of nicotine dependence: Secondary | ICD-10-CM

## 2015-07-24 DIAGNOSIS — F419 Anxiety disorder, unspecified: Secondary | ICD-10-CM | POA: Diagnosis present

## 2015-07-24 DIAGNOSIS — Z3A39 39 weeks gestation of pregnancy: Secondary | ICD-10-CM

## 2015-07-24 DIAGNOSIS — O24429 Gestational diabetes mellitus in childbirth, unspecified control: Secondary | ICD-10-CM | POA: Diagnosis not present

## 2015-07-24 DIAGNOSIS — R51 Headache: Secondary | ICD-10-CM | POA: Diagnosis present

## 2015-07-24 DIAGNOSIS — Z7982 Long term (current) use of aspirin: Secondary | ICD-10-CM | POA: Diagnosis not present

## 2015-07-24 DIAGNOSIS — F1290 Cannabis use, unspecified, uncomplicated: Secondary | ICD-10-CM | POA: Diagnosis present

## 2015-07-24 DIAGNOSIS — O4290 Premature rupture of membranes, unspecified as to length of time between rupture and onset of labor, unspecified weeks of gestation: Secondary | ICD-10-CM | POA: Diagnosis present

## 2015-07-24 DIAGNOSIS — Z8249 Family history of ischemic heart disease and other diseases of the circulatory system: Secondary | ICD-10-CM | POA: Diagnosis not present

## 2015-07-24 DIAGNOSIS — Z833 Family history of diabetes mellitus: Secondary | ICD-10-CM | POA: Diagnosis not present

## 2015-07-24 DIAGNOSIS — J40 Bronchitis, not specified as acute or chronic: Secondary | ICD-10-CM | POA: Diagnosis present

## 2015-07-24 DIAGNOSIS — O99820 Streptococcus B carrier state complicating pregnancy: Secondary | ICD-10-CM

## 2015-07-24 DIAGNOSIS — O8613 Vaginitis following delivery: Secondary | ICD-10-CM | POA: Diagnosis not present

## 2015-07-24 LAB — GLUCOSE, CAPILLARY: Glucose-Capillary: 96 mg/dL (ref 65–99)

## 2015-07-24 LAB — CBC
HEMATOCRIT: 38.8 % (ref 36.0–46.0)
Hemoglobin: 13.2 g/dL (ref 12.0–15.0)
MCH: 31.7 pg (ref 26.0–34.0)
MCHC: 34 g/dL (ref 30.0–36.0)
MCV: 93 fL (ref 78.0–100.0)
PLATELETS: 175 10*3/uL (ref 150–400)
RBC: 4.17 MIL/uL (ref 3.87–5.11)
RDW: 13.4 % (ref 11.5–15.5)
WBC: 9.7 10*3/uL (ref 4.0–10.5)

## 2015-07-24 LAB — TYPE AND SCREEN
ABO/RH(D): O POS
ANTIBODY SCREEN: NEGATIVE

## 2015-07-24 LAB — POCT FERN TEST: POCT Fern Test: POSITIVE

## 2015-07-24 MED ORDER — CITRIC ACID-SODIUM CITRATE 334-500 MG/5ML PO SOLN: 30.0000 mL | ORAL | Status: DC | PRN

## 2015-07-24 MED ORDER — SIMETHICONE 80 MG PO CHEW: 80.0000 mg | CHEWABLE_TABLET | ORAL | Status: DC | PRN

## 2015-07-24 MED ORDER — ACETAMINOPHEN 325 MG PO TABS
650.0000 mg | ORAL_TABLET | ORAL | Status: DC | PRN
  Administered 2015-07-24: 650 mg via ORAL

## 2015-07-24 MED ORDER — LIDOCAINE HCL (PF) 1 % IJ SOLN
30.0000 mL | INTRAMUSCULAR | Status: DC | PRN
  Filled 2015-07-24: qty 30

## 2015-07-24 MED ORDER — WITCH HAZEL-GLYCERIN EX PADS: 1.0000 "application " | MEDICATED_PAD | CUTANEOUS | Status: DC | PRN

## 2015-07-24 MED ORDER — ONDANSETRON HCL 4 MG/2ML IJ SOLN: 4.0000 mg | Freq: Four times a day (QID) | INTRAMUSCULAR | Status: DC | PRN

## 2015-07-24 MED ORDER — LACTATED RINGERS IV SOLN: 500.0000 mL | INTRAVENOUS | Status: DC | PRN

## 2015-07-24 MED ORDER — OXYTOCIN BOLUS FROM INFUSION: 500.0000 mL | INTRAVENOUS | Status: DC

## 2015-07-24 MED ORDER — PRENATAL MULTIVITAMIN CH: 1.0000 | ORAL_TABLET | Freq: Every day | ORAL | Status: DC

## 2015-07-24 MED ORDER — ONDANSETRON HCL 4 MG PO TABS: 4.0000 mg | ORAL_TABLET | ORAL | Status: DC | PRN

## 2015-07-24 MED ORDER — DIBUCAINE 1 % RE OINT: 1.0000 "application " | TOPICAL_OINTMENT | RECTAL | Status: DC | PRN

## 2015-07-24 MED ORDER — MISOPROSTOL 50MCG HALF TABLET: 50.0000 ug | ORAL_TABLET | ORAL | Status: DC | PRN

## 2015-07-24 MED ORDER — INFLUENZA VAC SPLIT QUAD 0.5 ML IM SUSY: 0.5000 mL | PREFILLED_SYRINGE | INTRAMUSCULAR | Status: DC

## 2015-07-24 MED ORDER — OXYTOCIN 40 UNITS IN LACTATED RINGERS INFUSION - SIMPLE MED
62.5000 mL/h | INTRAVENOUS | Status: DC
  Administered 2015-07-24: 999 mL/h via INTRAVENOUS

## 2015-07-24 MED ORDER — PENICILLIN G POTASSIUM 5000000 UNITS IJ SOLR
5.0000 10*6.[IU] | Freq: Once | INTRAVENOUS | Status: AC
  Administered 2015-07-24: 5 10*6.[IU] via INTRAVENOUS
  Filled 2015-07-24: qty 5

## 2015-07-24 MED ORDER — OXYTOCIN 40 UNITS IN LACTATED RINGERS INFUSION - SIMPLE MED
1.0000 m[IU]/min | INTRAVENOUS | Status: DC
  Filled 2015-07-24: qty 1000

## 2015-07-24 MED ORDER — PHENYLEPHRINE 40 MCG/ML (10ML) SYRINGE FOR IV PUSH (FOR BLOOD PRESSURE SUPPORT)
80.0000 ug | PREFILLED_SYRINGE | INTRAVENOUS | Status: DC | PRN
  Filled 2015-07-24: qty 20
  Filled 2015-07-24: qty 2

## 2015-07-24 MED ORDER — DOCUSATE SODIUM 100 MG PO CAPS
100.0000 mg | ORAL_CAPSULE | Freq: Two times a day (BID) | ORAL | Status: DC
  Administered 2015-07-24 – 2015-07-25 (×2): 100 mg via ORAL
  Filled 2015-07-24 (×2): qty 1

## 2015-07-24 MED ORDER — LANOLIN HYDROUS EX OINT: TOPICAL_OINTMENT | CUTANEOUS | Status: DC | PRN

## 2015-07-24 MED ORDER — IBUPROFEN 600 MG PO TABS
600.0000 mg | ORAL_TABLET | Freq: Four times a day (QID) | ORAL | Status: DC
  Administered 2015-07-24 – 2015-07-26 (×6): 600 mg via ORAL
  Filled 2015-07-24 (×6): qty 1

## 2015-07-24 MED ORDER — OXYTOCIN 40 UNITS IN LACTATED RINGERS INFUSION - SIMPLE MED: 62.5000 mL/h | INTRAVENOUS | Status: DC

## 2015-07-24 MED ORDER — PENICILLIN G POTASSIUM 5000000 UNITS IJ SOLR
2.5000 10*6.[IU] | INTRAVENOUS | Status: DC
  Administered 2015-07-24: 2.5 10*6.[IU] via INTRAVENOUS
  Filled 2015-07-24 (×5): qty 2.5

## 2015-07-24 MED ORDER — FENTANYL 2.5 MCG/ML BUPIVACAINE 1/10 % EPIDURAL INFUSION (WH - ANES)
14.0000 mL/h | INTRAMUSCULAR | Status: DC | PRN
  Administered 2015-07-24: 14 mL/h via EPIDURAL
  Filled 2015-07-24: qty 125

## 2015-07-24 MED ORDER — OXYCODONE-ACETAMINOPHEN 5-325 MG PO TABS: 1.0000 | ORAL_TABLET | ORAL | Status: DC | PRN

## 2015-07-24 MED ORDER — LACTATED RINGERS IV SOLN
INTRAVENOUS | Status: DC
  Administered 2015-07-25: 10:00:00 via INTRAVENOUS

## 2015-07-24 MED ORDER — OXYTOCIN 40 UNITS IN LACTATED RINGERS INFUSION - SIMPLE MED: 62.5000 mL/h | INTRAVENOUS | Status: DC | PRN

## 2015-07-24 MED ORDER — ACETAMINOPHEN 325 MG PO TABS
650.0000 mg | ORAL_TABLET | ORAL | Status: DC | PRN
  Filled 2015-07-24: qty 2

## 2015-07-24 MED ORDER — BENZOCAINE-MENTHOL 20-0.5 % EX AERO
1.0000 "application " | INHALATION_SPRAY | CUTANEOUS | Status: DC | PRN
  Administered 2015-07-24: 1 via TOPICAL
  Filled 2015-07-24 (×2): qty 56

## 2015-07-24 MED ORDER — LACTATED RINGERS IV SOLN
500.0000 mL | INTRAVENOUS | Status: DC | PRN
  Administered 2015-07-24: 1000 mL via INTRAVENOUS

## 2015-07-24 MED ORDER — LACTATED RINGERS IV SOLN
INTRAVENOUS | Status: DC
  Administered 2015-07-24: 09:00:00 via INTRAVENOUS

## 2015-07-24 MED ORDER — OXYCODONE-ACETAMINOPHEN 5-325 MG PO TABS: 2.0000 | ORAL_TABLET | ORAL | Status: DC | PRN

## 2015-07-24 MED ORDER — FAMOTIDINE 20 MG PO TABS
40.0000 mg | ORAL_TABLET | Freq: Once | ORAL | Status: AC
  Administered 2015-07-25: 40 mg via ORAL
  Filled 2015-07-24: qty 2

## 2015-07-24 MED ORDER — LACTATED RINGERS IV SOLN: INTRAVENOUS | Status: DC

## 2015-07-24 MED ORDER — ONDANSETRON HCL 4 MG/2ML IJ SOLN: 4.0000 mg | INTRAMUSCULAR | Status: DC | PRN

## 2015-07-24 MED ORDER — OXYCODONE-ACETAMINOPHEN 5-325 MG PO TABS
1.0000 | ORAL_TABLET | ORAL | Status: DC | PRN
  Administered 2015-07-25 – 2015-07-26 (×2): 1 via ORAL
  Filled 2015-07-24 (×2): qty 1

## 2015-07-24 MED ORDER — LIDOCAINE HCL (PF) 1 % IJ SOLN
INTRAMUSCULAR | Status: DC | PRN
  Administered 2015-07-24 (×2): 5 mL via EPIDURAL

## 2015-07-24 MED ORDER — CITRIC ACID-SODIUM CITRATE 334-500 MG/5ML PO SOLN
30.0000 mL | ORAL | Status: DC | PRN
Start: 2015-07-24 — End: 2015-07-24

## 2015-07-24 MED ORDER — DIPHENHYDRAMINE HCL 25 MG PO CAPS: 25.0000 mg | ORAL_CAPSULE | Freq: Four times a day (QID) | ORAL | Status: DC | PRN

## 2015-07-24 MED ORDER — DIPHENHYDRAMINE HCL 50 MG/ML IJ SOLN: 12.5000 mg | INTRAMUSCULAR | Status: DC | PRN

## 2015-07-24 MED ORDER — ACETAMINOPHEN 325 MG PO TABS: 650.0000 mg | ORAL_TABLET | ORAL | Status: DC | PRN

## 2015-07-24 MED ORDER — TERBUTALINE SULFATE 1 MG/ML IJ SOLN
0.2500 mg | Freq: Once | INTRAMUSCULAR | Status: DC | PRN
  Filled 2015-07-24: qty 1

## 2015-07-24 MED ORDER — METOCLOPRAMIDE HCL 10 MG PO TABS
10.0000 mg | ORAL_TABLET | Freq: Once | ORAL | Status: AC
  Administered 2015-07-25: 10 mg via ORAL
  Filled 2015-07-24: qty 1

## 2015-07-24 MED ORDER — EPHEDRINE 5 MG/ML INJ
10.0000 mg | INTRAVENOUS | Status: DC | PRN
  Filled 2015-07-24: qty 2

## 2015-07-24 NOTE — Anesthesia Procedure Notes (Signed)
Epidural Patient location during procedure: OB Start time: 07/24/2015 1:45 PM End time: 07/24/2015 1:53 PM  Staffing Anesthesiologist: Shona Simpson D Performed by: anesthesiologist   Preanesthetic Checklist Completed: patient identified, site marked, surgical consent, pre-op evaluation, timeout performed, IV checked, risks and benefits discussed and monitors and equipment checked  Epidural Patient position: sitting Prep: DuraPrep Patient monitoring: heart rate, continuous pulse ox and blood pressure Approach: midline Location: L4-L5 Injection technique: LOR saline  Needle:  Needle type: Tuohy  Needle gauge: 18 G Needle length: 9 cm and 9 Catheter type: closed end flexible Catheter size: 20 Guage Test dose: negative and Other  Assessment Events: blood not aspirated, injection not painful, no injection resistance, negative IV test and no paresthesia  Additional Notes LOR @ 6.5  Patient tolerated the insertion well without complications.Reason for block:procedure for pain

## 2015-07-24 NOTE — Anesthesia Preprocedure Evaluation (Addendum)
Anesthesia Evaluation  Patient identified by MRN, date of birth, ID band Patient awake    Reviewed: Allergy & Precautions, NPO status , Patient's Chart, lab work & pertinent test results  Airway Mallampati: II  TM Distance: >3 FB Neck ROM: Full    Dental  (+) Teeth Intact   Pulmonary former smoker,    breath sounds clear to auscultation       Cardiovascular hypertension,  Rhythm:Regular Rate:Normal     Neuro/Psych  Headaches, PSYCHIATRIC DISORDERS Anxiety    GI/Hepatic GERD  Medicated,  Endo/Other  diabetes, Type 2  Renal/GU   negative genitourinary   Musculoskeletal   Abdominal   Peds negative pediatric ROS (+)  Hematology negative hematology ROS (+)   Anesthesia Other Findings   Reproductive/Obstetrics                            Lab Results  Component Value Date   WBC 9.7 07/24/2015   HGB 13.2 07/24/2015   HCT 38.8 07/24/2015   MCV 93.0 07/24/2015   PLT 175 07/24/2015     Anesthesia Physical Anesthesia Plan  ASA: III  Anesthesia Plan: Epidural   Post-op Pain Management:    Induction:   Airway Management Planned:   Additional Equipment:   Intra-op Plan:   Post-operative Plan:   Informed Consent: I have reviewed the patients History and Physical, chart, labs and discussed the procedure including the risks, benefits and alternatives for the proposed anesthesia with the patient or authorized representative who has indicated his/her understanding and acceptance.     Plan Discussed with:   Anesthesia Plan Comments:         Anesthesia Quick Evaluation

## 2015-07-24 NOTE — H&P (Signed)
OBSTETRIC ADMISSION HISTORY AND PHYSICAL  Kellie Deleon is a 24 y.o. female G2P1001 with IUP at [redacted]w[redacted]d by 1st trimester u/s presenting for PROM. She reports +FMs, +LOF, no VB, +blurry vision that started this AM, + mild headach this AM and minimal peripheral edema, but no RUQ pain.  She plans on breast feeding. She request BTL for birth control.  Dating: By 1st trimester u/s --->  Estimated Date of Delivery: 07/30/15  Sono:  Anatomy u/s at 18 wks WNL, rpt u/s of face WNL due to limited view on prior  Clinic  [facility] Prenatal Labs  Dating 12/28/14 [place] 9.3 weeks Blood type: O/POS/-- (02/10 1553)   Genetic Screen 1 Screen:    AFP:     Quad: Neg     NIPS: Antibody:NEG (02/10 1553)  Anatomic [place]  Nml at 18 wks; limited view of face > rescan on 04/11/15 > normal Rubella: 5.90 (02/10 1553)  GTT Early:93       Third trimester: 137.  3hr:  82, 121, 122, 99 > nml RPR: Non Reactive (03/15 1218)   Flu vaccine  01/31/15 HBsAg: NEGATIVE (02/10 1553)   TDaP vaccine     05/09/15                                       HIV: NONREACTIVE (02/10 1553)   GBS  Positive                                      GBS: positive  Contraception  BTL > sign tubal consent at 28 wks (completed) Pap:nl 08/2013  Baby Food  Breast   Circumcision  Undecided   Pediatrician  [facility]   Support Person  Greig Castilla (Husband)     Prenatal History/Complications:  Past Medical History: Past Medical History  Diagnosis Date   No significant past medical history    Loss of teeth due to extraction    PCOS (polycystic ovarian syndrome)    Anxiety    Headache(784.0)    Bronchitis    Bacterial vaginosis     Pt stated she is prone to BV, "always comes and goes"   Gestational diabetes 2015   Pregnancy induced hypertension 2015    Past Surgical History: Past Surgical History  Procedure Laterality Date   Mouth surgery      Obstetrical History: OB History    Gravida Para Term Preterm AB TAB SAB Ectopic Multiple Living    2 1 1  0 0 0 0 0 0 1      Social History: Social History   Social History   Marital Status: Married    Spouse Name: N/A   Number of Children: N/A   Years of Education: N/A   Social History Main Topics   Smoking status: Former Smoker    Quit date: 10/27/2012   Smokeless tobacco: Never Used   Alcohol Use: No     Comment: Occassional Use   Drug Use: No     Comment: Marijuana use in 2013   Sexual Activity: Yes    Birth Control/ Protection: None   Other Topics Concern   None   Social History Narrative    Family History: Family History  Problem Relation Age of Onset   Cancer Other    Diabetes Maternal Grandmother    Hypertension Maternal Grandmother  Cancer Maternal Grandfather     Allergies: Allergies  Allergen Reactions   Latex Rash and Other (See Comments)    Reaction:  Burning     Prescriptions prior to admission  Medication Sig Dispense Refill Last Dose   acetaminophen (TYLENOL) 500 MG tablet Take 1,000 mg by mouth every 6 (six) hours as needed for mild pain or headache.    07/23/2015 at Unknown time   aspirin EC 81 MG tablet Take 81 mg by mouth at bedtime.    Past Week at Unknown time   cyclobenzaprine (FLEXERIL) 10 MG tablet Take 1 tablet (10 mg total) by mouth 2 (two) times daily as needed (heaaches). 30 tablet 0 07/23/2015 at Unknown time   pantoprazole (PROTONIX) 40 MG tablet Take 40 mg by mouth daily.   07/23/2015 at Unknown time   Prenatal Vit-Fe Fumarate-FA (PRENATAL VITAMINS PLUS) 27-1 MG TABS Take 1 tablet by mouth daily. (Patient taking differently: Take 1 tablet by mouth at bedtime. ) 30 tablet 11 07/23/2015 at Unknown time     Review of Systems   All systems reviewed and negative except as stated in HPI  Blood pressure 139/89, pulse 101, temperature 98.2 F (36.8 C), temperature source Oral, resp. rate 18, not currently breastfeeding. General appearance: alert, cooperative, appears stated age and no distress Lungs: clear to  auscultation bilaterally Heart: regular rate and rhythm Abdomen: soft, non-tender; bowel sounds normal Pelvic: 3/70/-2 cervix soft, mid Extremities: Homans sign is negative, no sign of DVT DTR's 2/4 Presentation: cephalic Fetal monitoringBaseline: 140 bpm, Variability: Good {> 6 bpm), Accelerations: Reactive and Decelerations: Absent Uterine activityFrequency: Every 2-6 minutes, variable, pt not feeling them Dilation: 1 Effacement (%): 70 Station: -3 Exam by:: Sarajane Marek, RNC   Prenatal labs: ABO, Rh: O/POS/-- (03/17 1348) Antibody: NEG (03/17 1348) Rubella:  immune RPR: NON REAC (06/23 1605)  HBsAg: NEGATIVE (03/17 1348)  HIV: NONREACTIVE (06/23 1605)  GBS: Positive (08/18 0000)  1 hr Glucola: 92 Genetic screening  AFP, quad screen negative Anatomy US 18wks WNL  Prenatal Transfer Tool  Maternal Diabetes: No Genetic Screening: Normal Maternal Ultrasounds/Referrals: Normal Fetal Ultrasounds or other Referrals:  None Maternal Substance Abuse:  No Significant Maternal Medications:  None Significant Maternal Lab Results: None  Results for orders placed or performed during the hospital encounter of 07/24/15 (from the past 24 hour(s))  Fern Test   Collection Time: 07/24/15  8:37 AM  Result Value Ref Range   POCT Fern Test Positive = ruptured amniotic membanes     Patient Active Problem List   Diagnosis Date Noted   Group B Streptococcus carrier, +RV culture, currently pregnant 07/16/2015   Condyloma acuminata 06/27/2015   History of pre-eclampsia in prior pregnancy, currently pregnant 02/07/2015   Supervision of low-risk pregnancy 01/31/2015   Mental disorders of mother, antepartum 09/06/2013    Assessment: Kellie Deleon is a 24 y.o. G2P1001 at [redacted]w[redacted]d here for PROM  #Labor: Progressing well, favorable cervix, bishop score 6, start pitocin #Pain: Plan for epidural #FWB: Category 1 tracing.  #ID:  PCN for GBS+ #MOF: breast #MOC: BTL #Circ:  Plan for outpt  circ  Maryjo Rochester, DO Resident Physician, PGY2

## 2015-07-24 NOTE — MAU Note (Signed)
States water broke @ 0745, clear. No contractions. States she has a headache. States BP has been up and down, had preeclampsia with last pregnancy.

## 2015-07-25 ENCOUNTER — Encounter (HOSPITAL_COMMUNITY)
Admission: AD | Disposition: A | Discharge status: Home / Self Care | Payer: Self-pay | Source: Ambulatory Visit | Attending: Family Medicine

## 2015-07-25 ENCOUNTER — Encounter (HOSPITAL_COMMUNITY): Payer: Self-pay | Admitting: Certified Registered Nurse Anesthetist

## 2015-07-25 ENCOUNTER — Inpatient Hospital Stay (HOSPITAL_COMMUNITY): Payer: Medicaid Other | Admitting: Anesthesiology

## 2015-07-25 ENCOUNTER — Encounter: Payer: Medicaid Other | Admitting: Obstetrics & Gynecology

## 2015-07-25 DIAGNOSIS — Z302 Encounter for sterilization: Secondary | ICD-10-CM | POA: Diagnosis not present

## 2015-07-25 HISTORY — PX: TUBAL LIGATION: SHX77

## 2015-07-25 LAB — RPR: RPR Ser Ql: NONREACTIVE

## 2015-07-25 LAB — CBC
HEMATOCRIT: 37.3 % (ref 36.0–46.0)
HEMOGLOBIN: 12.4 g/dL (ref 12.0–15.0)
MCH: 31.6 pg (ref 26.0–34.0)
MCHC: 33.2 g/dL (ref 30.0–36.0)
MCV: 94.9 fL (ref 78.0–100.0)
Platelets: 165 10*3/uL (ref 150–400)
RBC: 3.93 MIL/uL (ref 3.87–5.11)
RDW: 13.4 % (ref 11.5–15.5)
WBC: 11 10*3/uL — ABNORMAL HIGH (ref 4.0–10.5)

## 2015-07-25 LAB — HIV ANTIBODY (ROUTINE TESTING W REFLEX): HIV Screen 4th Generation wRfx: NONREACTIVE

## 2015-07-25 SURGERY — LIGATION, FALLOPIAN TUBE, POSTPARTUM
Anesthesia: Epidural | Site: Abdomen | Laterality: Bilateral

## 2015-07-25 MED ORDER — DEXAMETHASONE SODIUM PHOSPHATE 4 MG/ML IJ SOLN
INTRAMUSCULAR | Status: AC
  Filled 2015-07-25: qty 1

## 2015-07-25 MED ORDER — ENALAPRIL MALEATE 10 MG PO TABS
10.0000 mg | ORAL_TABLET | Freq: Every day | ORAL | Status: DC
  Filled 2015-07-25 (×2): qty 1

## 2015-07-25 MED ORDER — PROPOFOL 10 MG/ML IV BOLUS
INTRAVENOUS | Status: AC
  Filled 2015-07-25: qty 20

## 2015-07-25 MED ORDER — MIDAZOLAM HCL 2 MG/2ML IJ SOLN
INTRAMUSCULAR | Status: AC
  Filled 2015-07-25: qty 4

## 2015-07-25 MED ORDER — FENTANYL CITRATE (PF) 100 MCG/2ML IJ SOLN
INTRAMUSCULAR | Status: AC
  Filled 2015-07-25: qty 4

## 2015-07-25 MED ORDER — SODIUM BICARBONATE 8.4 % IV SOLN
INTRAVENOUS | Status: DC | PRN
  Administered 2015-07-25 (×5): 5 mL via EPIDURAL

## 2015-07-25 MED ORDER — LIDOCAINE-EPINEPHRINE (PF) 2 %-1:200000 IJ SOLN
INTRAMUSCULAR | Status: AC
  Filled 2015-07-25: qty 20

## 2015-07-25 MED ORDER — FENTANYL CITRATE (PF) 100 MCG/2ML IJ SOLN
INTRAMUSCULAR | Status: DC | PRN
  Administered 2015-07-25 (×3): 50 ug via INTRAVENOUS

## 2015-07-25 MED ORDER — BUPIVACAINE HCL (PF) 0.25 % IJ SOLN
INTRAMUSCULAR | Status: DC | PRN
  Administered 2015-07-25: 10 mL

## 2015-07-25 MED ORDER — MIDAZOLAM HCL 2 MG/2ML IJ SOLN
INTRAMUSCULAR | Status: DC | PRN
  Administered 2015-07-25 (×4): 1 mg via INTRAVENOUS

## 2015-07-25 MED ORDER — ONDANSETRON HCL 4 MG/2ML IJ SOLN
INTRAMUSCULAR | Status: AC
  Filled 2015-07-25: qty 2

## 2015-07-25 MED ORDER — SCOPOLAMINE 1 MG/3DAYS TD PT72
1.0000 | MEDICATED_PATCH | TRANSDERMAL | Status: DC
  Administered 2015-07-25: 1.5 mg via TRANSDERMAL

## 2015-07-25 MED ORDER — FENTANYL CITRATE (PF) 100 MCG/2ML IJ SOLN
INTRAMUSCULAR | Status: AC
  Filled 2015-07-25: qty 2

## 2015-07-25 MED ORDER — ONDANSETRON HCL 4 MG/2ML IJ SOLN
INTRAMUSCULAR | Status: DC | PRN
  Administered 2015-07-25: 4 mg via INTRAVENOUS

## 2015-07-25 MED ORDER — LIDOCAINE HCL (CARDIAC) 20 MG/ML IV SOLN
INTRAVENOUS | Status: AC
  Filled 2015-07-25: qty 5

## 2015-07-25 MED ORDER — DEXAMETHASONE SODIUM PHOSPHATE 4 MG/ML IJ SOLN
INTRAMUSCULAR | Status: DC | PRN
  Administered 2015-07-25: 4 mg via INTRAVENOUS

## 2015-07-25 MED ORDER — BUPIVACAINE HCL (PF) 0.25 % IJ SOLN
INTRAMUSCULAR | Status: AC
  Filled 2015-07-25: qty 30

## 2015-07-25 MED ORDER — FENTANYL CITRATE (PF) 100 MCG/2ML IJ SOLN
25.0000 ug | INTRAMUSCULAR | Status: DC | PRN
  Administered 2015-07-25 (×2): 50 ug via INTRAVENOUS

## 2015-07-25 SURGICAL SUPPLY — 20 items
APPLICATOR COTTON TIP 6IN STRL (MISCELLANEOUS) ×16 IMPLANT
CHLORAPREP W/TINT 26ML (MISCELLANEOUS) ×3 IMPLANT
CLIP FILSHIE TUBAL LIGA STRL (Clip) ×3 IMPLANT
CLOTH BEACON ORANGE TIMEOUT ST (SAFETY) ×3 IMPLANT
DRSG OPSITE POSTOP 3X4 (GAUZE/BANDAGES/DRESSINGS) ×3 IMPLANT
GLOVE BIOGEL PI IND STRL 7.5 (GLOVE) ×1 IMPLANT
GLOVE BIOGEL PI INDICATOR 7.5 (GLOVE) ×2
GLOVE ECLIPSE 7.5 STRL STRAW (GLOVE) ×3 IMPLANT
GOWN STRL REUS W/TWL LRG LVL3 (GOWN DISPOSABLE) ×6 IMPLANT
LIQUID BAND (GAUZE/BANDAGES/DRESSINGS) ×2 IMPLANT
NEEDLE HYPO 22GX1.5 SAFETY (NEEDLE) ×3 IMPLANT
NS IRRIG 1000ML POUR BTL (IV SOLUTION) ×3 IMPLANT
PACK ABDOMINAL MINOR (CUSTOM PROCEDURE TRAY) ×3 IMPLANT
SPONGE LAP 4X18 X RAY DECT (DISPOSABLE) IMPLANT
SUT VICRYL 0 UR6 27IN ABS (SUTURE) ×3 IMPLANT
SUT VICRYL 4-0 PS2 18IN ABS (SUTURE) ×3 IMPLANT
SYR CONTROL 10ML LL (SYRINGE) ×3 IMPLANT
TOWEL OR 17X24 6PK STRL BLUE (TOWEL DISPOSABLE) ×6 IMPLANT
TRAY FOLEY CATH SILVER 14FR (SET/KITS/TRAYS/PACK) ×3 IMPLANT
WATER STERILE IRR 1000ML POUR (IV SOLUTION) ×3 IMPLANT

## 2015-07-25 NOTE — Anesthesia Preprocedure Evaluation (Signed)
Anesthesia Evaluation  Patient identified by MRN, date of birth, ID band Patient awake    Reviewed: Allergy & Precautions, NPO status , Patient's Chart, lab work & pertinent test results  Airway Mallampati: II  TM Distance: >3 FB Neck ROM: Full    Dental  (+) Teeth Intact   Pulmonary former smoker,    breath sounds clear to auscultation       Cardiovascular hypertension,  Rhythm:Regular Rate:Normal     Neuro/Psych  Headaches, PSYCHIATRIC DISORDERS    GI/Hepatic GERD  Medicated,  Endo/Other  diabetes, Type 2  Renal/GU      Musculoskeletal   Abdominal   Peds  Hematology negative hematology ROS (+)   Anesthesia Other Findings   Reproductive/Obstetrics                             Anesthesia Physical Anesthesia Plan  ASA: III  Anesthesia Plan: Epidural   Post-op Pain Management:    Induction:   Airway Management Planned:   Additional Equipment:   Intra-op Plan:   Post-operative Plan:   Informed Consent: I have reviewed the patients History and Physical, chart, labs and discussed the procedure including the risks, benefits and alternatives for the proposed anesthesia with the patient or authorized representative who has indicated his/her understanding and acceptance.     Plan Discussed with:   Anesthesia Plan Comments:         Anesthesia Quick Evaluation

## 2015-07-25 NOTE — Anesthesia Postprocedure Evaluation (Signed)
  Anesthesia Post-op Note  Patient: Kellie Deleon  Procedure(s) Performed: * No procedures listed *  Patient Location: Mother/Baby  Anesthesia Type:Epidural  Level of Consciousness: awake, alert  and oriented  Airway and Oxygen Therapy: Patient Spontanous Breathing  Post-op Pain: none  Post-op Assessment: Post-op Vital signs reviewed and Patient's Cardiovascular Status Stable              Post-op Vital Signs: Reviewed and stable  Last Vitals:  Filed Vitals:   07/25/15 0555  BP: 151/86  Pulse: 93  Temp: 36.7 C  Resp: 20    Complications: No apparent anesthesia complications

## 2015-07-25 NOTE — Transfer of Care (Signed)
Immediate Anesthesia Transfer of Care Note  Patient: Kellie Deleon  Procedure(s) Performed: Procedure(s): POST PARTUM TUBAL LIGATION (Bilateral)  Patient Location: PACU  Anesthesia Type:Epidural  Level of Consciousness: awake, alert , oriented and patient cooperative  Airway & Oxygen Therapy: Patient Spontanous Breathing  Post-op Assessment: Report given to RN and Post -op Vital signs reviewed and stable  Post vital signs: Reviewed and stable  Last Vitals:  Filed Vitals:   07/25/15 1102  BP: 122/69  Pulse: 86  Temp: 36.7 C  Resp: 18    Complications: No apparent anesthesia complications

## 2015-07-25 NOTE — Lactation Note (Signed)
This note was copied from the chart of Kellie Talynn Lebon. Lactation Consultation Note Mom's 2nd baby, didn't BF her first. Excited this baby is doing well. Mom has short shaft everted nipples. Baby latches on, may need chin tug. Hand expression taught w/good easy flow colostrum.  Assisted mom in football position, she states she likes that position. She did go to a BF class. Has supportive FOB at bedside. Baby latched on. Suckling well. Adjusted to wider flange. Her swallows.  Mom encouraged to feed baby 8-12 times/24 hours and with feeding cues. Educated about newborn behavior. I&O, cluster feeding, supply and demand. Mom encouraged to do skin-to-skin.Mom encouraged to waken baby for feeds. Referred to Baby and Me Book in Breastfeeding section Pg. 22-23 for position options and Proper latch demonstration.[facility]/LC brochure given w/resources, support groups and LC services. Patient Name: Kellie Deleon [id number] Date: 07/25/2015 Reason for consult: Initial assessment   Maternal Data Has patient been taught Hand Expression?: Yes Does the patient have breastfeeding experience prior to this delivery?: No  Feeding Feeding Type: Breast Fed Length of feed: 10 min  LATCH Score/Interventions Latch: Grasps breast easily, tongue down, lips flanged, rhythmical sucking. Intervention(s): Adjust position;Assist with latch;Breast massage;Breast compression  Audible Swallowing: A few with stimulation Intervention(s): Skin to skin;Hand expression;Alternate breast massage  Type of Nipple: Everted at rest and after stimulation  Comfort (Breast/Nipple): Soft / non-tender     Hold (Positioning): Assistance needed to correctly position infant at breast and maintain latch. Intervention(s): Breastfeeding basics reviewed;Support Pillows;Position options;Skin to skin  LATCH Score: 8  Lactation Tools Discussed/Used WIC Program: Yes   Consult Status Consult Status: Follow-up Date: 07/26/15 Follow-up  type: In-patient    Charyl Dancer 07/25/2015, 7:21 AM

## 2015-07-25 NOTE — Anesthesia Postprocedure Evaluation (Signed)
  Anesthesia Post-op Note  Patient: Kellie Deleon  Procedure(s) Performed: Procedure(s): POST PARTUM TUBAL LIGATION (Bilateral)  Patient is awake, responsive, moving her legs, and has signs of resolution of her numbness. Pain and nausea are reasonably well controlled. Vital signs are stable and clinically acceptable. Oxygen saturation is clinically acceptable. There are no apparent anesthetic complications at this time. Patient is ready for discharge.

## 2015-07-25 NOTE — Progress Notes (Signed)
When I went in to do the pt's assessment at 0310 the pt was complaining that her epidural site was uncomfortable and that she could feel the catheter in her back when she moved. She stated that it was sore and it felt more uncomfortable when she moved. Upon inspection there was some dried blood around the site but no swelling or redness and it seemed to be in placed. I called L&D and asked if one of the RN's could come and look at it and she did. She felt that it was okay but that if the patient was uncomfortable that it would not hurt to get the CRNA to look at it so she called her. The CRNA went to the patients room to assess the site at 0525.

## 2015-07-25 NOTE — Progress Notes (Signed)
Patient ID: Analise Glotfelty, female   DOB: 01/30/1991, 24 y.o.   MRN: [id number]  Risks of procedure discussed with patient including but not limited to: risk of regret, permanence of method, bleeding, infection, injury to surrounding organs and need for additional procedures.  Failure risk of 1 -2 % with increased risk of ectopic gestation if pregnancy occurs was also discussed with patient.    Levie Heritage, DO 07/25/2015 11:36 AM

## 2015-07-25 NOTE — Progress Notes (Signed)
Post Partum Day 1 Subjective:  Kellie Deleon is a 24 y.o. [id number] [redacted]w[redacted]d s/p VD induced due to PROM.  No acute events overnight.  Pt denies problems with ambulating, voiding or po intake.  She denies nausea or vomiting.  Pain is well controlled.  She has had flatus. She has not had bowel movement.  Lochia Moderate.  Plan for birth control is bilateral tubal ligation.  Method of Feeding: Breast  Objective: Blood pressure 151/86, pulse 93, temperature 98.1 F (36.7 C), temperature source Oral, resp. rate 20, height  (1.6 m), weight 245 lb (111.131 kg), SpO2 100 %, unknown if currently breastfeeding.  Physical Exam:  General: alert, cooperative and no distress Lochia:normal flow Chest: CTAB Heart: RRR no m/r/g Abdomen: +BS, soft, nontender,  Uterine Fundus: firm, at level of umbilicus DVT Evaluation: No evidence of DVT seen on physical exam. Extremities: no edema   Recent Labs  07/24/15 0910  HGB 13.2  HCT 38.8    Assessment/Plan:  ASSESSMENT: Kellie Deleon is a 24 y.o. [id number] [redacted]w[redacted]d s/p IVD for PROM. She has been having elevated blood pressures and we will start patient on enalapril  PO daily. Will need BP check within 1 week.  Discharge home later today after BTL.   LOS: 1 day   Durenda Hurt 07/25/2015, 9:30 AM

## 2015-07-26 ENCOUNTER — Encounter (HOSPITAL_COMMUNITY): Payer: Self-pay | Admitting: Family Medicine

## 2015-07-26 MED ORDER — IBUPROFEN 600 MG PO TABS: 600.0000 mg | ORAL_TABLET | Freq: Four times a day (QID) | ORAL | Status: DC

## 2015-07-26 MED ORDER — OXYCODONE-ACETAMINOPHEN 5-325 MG PO TABS: 1.0000 | ORAL_TABLET | ORAL | Status: DC | PRN

## 2015-07-26 NOTE — Anesthesia Postprocedure Evaluation (Signed)
  Anesthesia Post-op Note  Patient: Kellie Deleon  Procedure(s) Performed: Procedure(s): POST PARTUM TUBAL LIGATION (Bilateral)  Patient Location: Mother/Baby  Anesthesia Type:Epidural  Level of Consciousness: awake and alert   Airway and Oxygen Therapy: Patient Spontanous Breathing  Post-op Pain: mild  Post-op Assessment: Post-op Vital signs reviewed, Patient's Cardiovascular Status Stable, Respiratory Function Stable, No signs of Nausea or vomiting, Pain level controlled, No headache, Spinal receding and Patient able to bend at knees  Post-op Vital Signs: Reviewed  Last Vitals:  Filed Vitals:   07/26/15 0550  BP: 125/61  Pulse: 91  Temp: 37.1 C  Resp: 18    Complications: No apparent anesthesia complications

## 2015-07-26 NOTE — Addendum Note (Signed)
Addendum  created 07/26/15 0758 by Angela Adam, CRNA   Modules edited: Notes Section   Notes Section:  File: [id number]

## 2015-07-26 NOTE — Discharge Summary (Signed)
Obstetric Discharge Summary Reason for Admission: rupture of membranes Prenatal Procedures: none Intrapartum Procedures: spontaneous vaginal delivery Postpartum Procedures: P.P. tubal ligation Complications-Operative and Postpartum: none HEMOGLOBIN  Date Value Ref Range Status  07/25/2015 12.4 12.0 - 15.0 g/dL Final   HCT  Date Value Ref Range Status  07/25/2015 37.3 36.0 - 46.0 % Final   Kellie Deleon is a 24yo G2P1001 admitted @ 39.1wks w/ PROM. With Pitocin for augmentation she progressed to SVD by that same afternoon. On 9/8 she had a BTL. By PPD#2/POD#1 she was doing well and was deemed to have received the full benefit of her hospital stay and will be discharged home. She is breastfeeding. Will need a 2hr glucola at her PP visit due to GDM (note to clinic).   Physical Exam:  General: alert, cooperative and no distress  Heart: RRR Lungs: nl effort Lochia: appropriate Uterine Fundus: firm Incision: healing well; open to air DVT Evaluation: No evidence of DVT seen on physical exam.  Discharge Diagnoses: Term Pregnancy-delivered and PP BTL  Discharge Information: Date: 07/26/2015 Activity: pelvic rest Diet: routine Medications: PNV, Ibuprofen and Percocet Condition: stable Instructions: refer to practice specific booklet Discharge to: home Follow-up Information    Follow up with [facility]. Schedule an appointment as soon as possible for a visit in 6 weeks.   Why:  For your postpartum appointment. You will also need a test for diabetes at your postpartum visit.   Contact information:   [address] [place] [address] [phone]      Newborn Data: Live born female  Birth Weight: 7 lb 5.3 oz (3325 g) APGAR: 9, 9  Home with mother.  Cam Hai CNM 07/26/2015, 9:17 AM

## 2015-07-26 NOTE — Lactation Note (Signed)
This note was copied from the chart of Kellie Deleon. Lactation Consultation Note Follow up at 44 hours of age.  Mom reports baby is doing well, she supplemented over night but does not plan to keep giving formula.  Mom last pumped yesterday and didn't get anything.  Explained need to stimulate breast and work on hand expression after to collect mls for baby.  Baby has 7%weight loss with good feedings and output.  Mom plans to breastfeed on demand, document intake and output and supplement only if needed.  Mom plans to buy a pump on the way home.  Assisted with latch, mom initially allows shallow latch, encouraged mom to wait for wide open mouth and explained need for deep latch and supply and demand.  Colostrum easily expressed and mom denies pain with latches.  Mom then able to independently latch baby deeply with good strong rhythmic sucking noted.  Baby has weight check on Sunday.  Mom has syringe to finger feed as supplement as needed, but plan is she wont need it.  Breasts are filling and warm to touch.  Mom denies concerns at this time and questions answered.  Family is ready for discharge.     Patient Name: Kellie Deleon [id number] Date: 07/26/2015 Reason for consult: Follow-up assessment   Maternal Data    Feeding Feeding Type: Breast Fed Length of feed:  (10 minutes observed)  LATCH Score/Interventions Latch: Grasps breast easily, tongue down, lips flanged, rhythmical sucking. Intervention(s): Adjust position;Assist with latch;Breast massage;Breast compression  Audible Swallowing: A few with stimulation  Type of Nipple: Everted at rest and after stimulation  Comfort (Breast/Nipple): Soft / non-tender     Hold (Positioning): Assistance needed to correctly position infant at breast and maintain latch. Intervention(s): Breastfeeding basics reviewed;Support Pillows;Position options;Skin to skin  LATCH Score: 8  Lactation Tools Discussed/Used     Consult Status Consult  Status: Complete    Seymour Pavlak, Arvella Merles 07/26/2015, 11:35 AM

## 2015-07-26 NOTE — Clinical Social Work Maternal (Signed)
  CLINICAL SOCIAL WORK MATERNAL/CHILD NOTE  Patient Details  Name: Kellie Deleon MRN: [id number] Date of Birth: 01/18/1991  Date:  07/26/2015  Clinical Social Worker Initiating Note:  Lopez Dentinger, LCSW Date/ Time Initiated:  07/26/15/0900     Child's Name:  Kellie Deleon   Legal Guardian:  Iline and Andrew Lysne  Need for Interpreter:  None   Date of Referral:  07/24/15     Reason for Referral:  History of anxiety   Referral Source:  Central Nursery   Address:  [address] [place], [place] [address]  Phone number:  [phone]   Household Members:  Minor Children, Significant Other   Natural Supports (not living in the home):  Extended Family, Immediate Family   Professional Supports: None identified  Education:    N/A  Financial Resources:  Medicaid   Other Resources:  WIC   Cultural/Religious Considerations Which May Impact Care: None reported  Strengths:  Ability to meet basic needs , Home prepared for child , Pediatrician chosen    Risk Factors/Current Problems:   1) Remote history of anxiety.  Cognitive State:  Able to Concentrate , Alert , Goal Oriented , Linear Thinking , Insightful    Mood/Affect:  Happy  Calm , Comfortable    CSW Assessment:  CSW received request for consult due to MOB presenting with a history of anxiety. MOB presented as easily engaged and receptive to the visit.  MOB was observed to be caring for the infant during the assessment. She presented in a pleasant mood and displayed a full range in affect. No anxiety noted in MOB's presentation or thought process.   MOB reported eagerness and readiness to be discharged home. She endorsed strong family support, and shared that she is looking forward to transitioning to caring for two children. MOB denied questions, concerns, or needs as she transitions home. MOB did not identify any psychosocial stressors that may negatively impact her transition postpartum. MOB unable to  recall when she first received diagnosis of anxiety. She stated that it has "been awhile", and stated that it was likely more than 3 years ago. MOB stated that she is unable to remember when she last experienced symptoms of anxiety, and shared belief that anxiety was prior to becoming a mother and was "situational".  MOB denied any anxiety during her first pregnancy, and denied perinatal mood and anxiety disorders.  MOB shared that she has noted no symptoms during this pregnancy, but expressed intention to contact her medical provider if she notes onset of symptoms.    CSW Plan/Description:   1)Patient/Family Education: Perinatal mood and anxiety disorders 2)No Further Intervention Required/No Barriers to Discharge    Timtohy Broski N, LCSW 07/26/2015, 9:39 AM  

## 2015-07-26 NOTE — Discharge Instructions (Signed)

## 2015-07-26 NOTE — Progress Notes (Signed)
UR chart review completed.  

## 2015-07-27 ENCOUNTER — Encounter (HOSPITAL_COMMUNITY): Payer: Self-pay | Admitting: *Deleted

## 2015-07-27 ENCOUNTER — Inpatient Hospital Stay (HOSPITAL_COMMUNITY)
Admission: AD | Admit: 2015-07-27 | Discharge: 2015-07-27 | Disposition: A | Discharge status: Home / Self Care | Payer: Medicaid Other | Source: Ambulatory Visit | Attending: Obstetrics & Gynecology | Admitting: Obstetrics & Gynecology

## 2015-07-27 DIAGNOSIS — Z87891 Personal history of nicotine dependence: Secondary | ICD-10-CM | POA: Diagnosis not present

## 2015-07-27 DIAGNOSIS — N6459 Other signs and symptoms in breast: Secondary | ICD-10-CM

## 2015-07-27 DIAGNOSIS — R109 Unspecified abdominal pain: Secondary | ICD-10-CM | POA: Diagnosis present

## 2015-07-27 DIAGNOSIS — G8918 Other acute postprocedural pain: Secondary | ICD-10-CM | POA: Diagnosis not present

## 2015-07-27 DIAGNOSIS — O9279 Other disorders of lactation: Secondary | ICD-10-CM | POA: Diagnosis not present

## 2015-07-27 DIAGNOSIS — K5900 Constipation, unspecified: Secondary | ICD-10-CM | POA: Diagnosis not present

## 2015-07-27 LAB — CBC
HCT: 38.1 % (ref 36.0–46.0)
Hemoglobin: 12.7 g/dL (ref 12.0–15.0)
MCH: 31.7 pg (ref 26.0–34.0)
MCHC: 33.3 g/dL (ref 30.0–36.0)
MCV: 95 fL (ref 78.0–100.0)
Platelets: 180 10*3/uL (ref 150–400)
RBC: 4.01 MIL/uL (ref 3.87–5.11)
RDW: 13.5 % (ref 11.5–15.5)
WBC: 14.1 10*3/uL — ABNORMAL HIGH (ref 4.0–10.5)

## 2015-07-27 MED ORDER — ALUM & MAG HYDROXIDE-SIMETH 200-200-20 MG/5ML PO SUSP
30.0000 mL | Freq: Once | ORAL | Status: AC
  Administered 2015-07-27: 30 mL via ORAL
  Filled 2015-07-27: qty 30

## 2015-07-27 MED ORDER — IBUPROFEN 800 MG PO TABS
800.0000 mg | ORAL_TABLET | Freq: Once | ORAL | Status: AC
  Administered 2015-07-27: 800 mg via ORAL
  Filled 2015-07-27: qty 1

## 2015-07-27 MED ORDER — MAGNESIUM HYDROXIDE 400 MG/5ML PO SUSP
30.0000 mL | Freq: Once | ORAL | Status: AC
  Administered 2015-07-27: 30 mL via ORAL
  Filled 2015-07-27: qty 30

## 2015-07-27 MED ORDER — MEPERIDINE HCL 50 MG PO TABS
50.0000 mg | ORAL_TABLET | Freq: Once | ORAL | Status: AC
  Administered 2015-07-27: 50 mg via ORAL

## 2015-07-27 MED ORDER — PROMETHAZINE HCL 25 MG PO TABS
25.0000 mg | ORAL_TABLET | Freq: Once | ORAL | Status: AC
  Administered 2015-07-27: 25 mg via ORAL
  Filled 2015-07-27: qty 1

## 2015-07-27 NOTE — Progress Notes (Signed)
Kellie Deleon CNM notified of pt's admission and status. Will see pt 

## 2015-07-27 NOTE — MAU Note (Addendum)
SVD 9/7 and BTL 9/8. Having severe pain R lower abd. Breasts engorged. Tried to pump but doesn't get anything out. Hand expressed alittle milk. Difficult for baby to latch on due to engorgement.

## 2015-07-27 NOTE — MAU Note (Signed)
Kellie Deleon, [facility] [employer] contacted to help with obtaining lactation consult.

## 2015-07-27 NOTE — MAU Note (Signed)
Incision at umbilicus clean and dry with slight redness. Abd soft. Breasts slightly firm with slight redness

## 2015-07-27 NOTE — MAU Provider Note (Signed)
History     CSN: [id number]  Arrival date and time: 07/27/15 3086   First Provider Initiated Contact with Patient 07/27/15 [phone]      Chief Complaint  Patient presents with  . Abdominal Pain   HPI  Kellie Deleon 24 y.o. [id number] status post SVD with pp tubal; discharge yesterday. Has not taken any pain medicine since discharge and is having shoulder pain, no bowel movement and breast engorgement.  Past Medical History  Diagnosis Date  . No significant past medical history   . Loss of teeth due to extraction   . PCOS (polycystic ovarian syndrome)   . Anxiety   . Headache(784.0)   . Bronchitis   . Bacterial vaginosis     Pt stated she is prone to BV, "always comes and goes"  . Gestational diabetes 2015  . Pregnancy induced hypertension 2015    Past Surgical History  Procedure Laterality Date  . Mouth surgery    . Tubal ligation Bilateral 07/25/2015    Procedure: POST PARTUM TUBAL LIGATION;  Surgeon: Levie Heritage, DO;  Location: [facility];  Service: Gynecology;  Laterality: Bilateral;    Family History  Problem Relation Age of Onset  . Cancer Other   . Diabetes Maternal Grandmother   . Hypertension Maternal Grandmother   . Cancer Maternal Grandfather     Social History  Substance Use Topics  . Smoking status: Former Smoker    Quit date: 10/27/2012  . Smokeless tobacco: Never Used  . Alcohol Use: No     Comment: Occassional Use    Allergies:  Allergies  Allergen Reactions  . Latex Rash and Other (See Comments)    Reaction:  Burning     Prescriptions prior to admission  Medication Sig Dispense Refill Last Dose  . acetaminophen (TYLENOL) 500 MG tablet Take 500 mg by mouth every 6 (six) hours as needed for mild pain or headache.   Past Week at Unknown time  . ibuprofen (ADVIL,MOTRIN) 600 MG tablet Take 1 tablet (600 mg total) by mouth every 6 (six) hours. 30 tablet 0 07/26/2015 at Unknown time  . oxyCODONE-acetaminophen (PERCOCET/ROXICET) 5-325 MG per tablet  Take 1 tablet by mouth every 4 (four) hours as needed (for pain scale 4-7). 20 tablet 0 07/26/2015 at Unknown time  . Prenatal Vit-Fe Fumarate-FA (PRENATAL VITAMINS PLUS) 27-1 MG TABS Take 1 tablet by mouth daily. (Patient taking differently: Take 1 tablet by mouth at bedtime. ) 30 tablet 11 Past Week at Unknown time   Results for orders placed or performed during the hospital encounter of 07/27/15 (from the past 48 hour(s))  CBC     Status: Abnormal   Collection Time: 07/27/15  7:35 AM  Result Value Ref Range   WBC 14.1 (H) 4.0 - 10.5 K/uL   RBC 4.01 3.87 - 5.11 MIL/uL   Hemoglobin 12.7 12.0 - 15.0 g/dL   HCT 84.1 32.4 - 40.1 %   MCV 95.0 78.0 - 100.0 fL   MCH 31.7 26.0 - 34.0 pg   MCHC 33.3 30.0 - 36.0 g/dL   RDW 02.7 25.3 - 66.4 %   Platelets 180 150 - 400 K/uL    Review of Systems  Constitutional: Negative for fever.  Eyes: Negative for blurred vision.  Gastrointestinal: Positive for abdominal pain and constipation. Negative for nausea and vomiting.       Bloated; pain on lower right side of abdomen  Neurological: Negative for headaches.  Psychiatric/Behavioral: The patient is nervous/anxious.  Hx of anxiety   Physical Exam   Blood pressure 125/84, pulse 100, temperature 98.3 F (36.8 C), resp. rate 18, height  (1.6 m), weight 104.237 kg (229 lb 12.8 oz), unknown if currently breastfeeding.  Physical Exam  Nursing note and vitals reviewed. Constitutional: She appears well-developed and well-nourished. No distress.  HENT:  Head: Normocephalic.  Neck: Normal range of motion.  Respiratory: Effort normal. No respiratory distress.  GI: Soft. She exhibits distension. She exhibits no mass. There is tenderness in the right lower quadrant. There is no rebound and no guarding.    MAU Course  Procedures  MDM  Pt has a history of anxiety and  Was discharged from the hospital yesterday. She exhibits abdominal distention and she has not taken any pain medicine since she  went home. She is expereincing initial breast engorgement and has not had the baby latch or pump since she went home. Pain meds ordered; Lactation consult pending. Signed over care to J. Rasch, NP  Discussed HPI>Labs and physical exam with Dr. Debroah Loop Patient had significant improvement following pain medication. Patient has minimal-generalized tenderness on abdominal exam. + bowel sounds in all four quadrants. Denies N/V, Denies fever at home.   Lactation consultant with the patient at the bedside.   Assessment and Plan   A:  1. Post-op pain   2. Constipation, unspecified constipation type   3. Breast engorgement    P:  Discharge home in stable condition Return to MAU with any Fever, N/V or worsening symptoms Discussed meds to use over the counter for BM Increase PO fluid intake Warm/ cool compresses to the breast Follow up in the [facility] as scheduled Instructed to use pain medication as directed on the bottle for pain.   Clemmons,Lori Grissett 07/27/2015, 7:31 AM   Duane Lope, NP 07/27/2015 , 10:13 AM

## 2015-07-27 NOTE — MAU Note (Signed)
Lactation consultant in to see patient. 

## 2015-07-27 NOTE — Progress Notes (Signed)
Mother complaining of engorgement. Breasts are filling. Mother states she has started giving baby pacifier because he seems fussy and has supplemented w/ some formula with bottle nipple. Provided educated about cluster feeding, feeding on demand, sucks and swallows, breast massage and engorgement care.  Observed feeding. Suggest baby be brought to nipple height with pillows. Encouraged mother to wait until baby opens wide to latch.  Demonstrated how to perform chin tug for more depth and massage full areas of breast to empty during feeding.  Rhythmical sucks and swallows observed and showed parents how to watch for swallows. Provided support to feed baby on demand 8-12 times a day. Discussed applying ice packs after feeding for 10-15 min if needed and post pump  for 5 min to soften breast and provided mother w/ engorgement care information sheet.  Suggest she call for further help w/ OP appointment and come to support group.

## 2015-07-27 NOTE — MAU Note (Signed)
Have been calling ext. [phone] with no success since 0730 to arrange lactation consult as this was the contact number provided. Called ext. [phone] and left a message for consultation. Awaiting reply.

## 2015-07-28 ENCOUNTER — Encounter (HOSPITAL_COMMUNITY): Payer: Self-pay | Admitting: *Deleted

## 2015-07-28 ENCOUNTER — Inpatient Hospital Stay (HOSPITAL_COMMUNITY)
Admission: AD | Admit: 2015-07-28 | Discharge: 2015-07-28 | Disposition: A | Discharge status: Home / Self Care | Payer: Medicaid Other | Source: Ambulatory Visit | Attending: Obstetrics & Gynecology | Admitting: Obstetrics & Gynecology

## 2015-07-28 NOTE — MAU Provider Note (Signed)
History    G2P2 delivered on Wednesday also had BTL in with c/o passing clot at home. States only changed pads 3 times today. CSN: [id number]  Arrival date and time: 07/28/15 2214   None     No chief complaint on file.  HPI  OB History    Gravida Para Term Preterm AB TAB SAB Ectopic Multiple Living   2 2 2  0 0 0 0 0 0 2      Past Medical History  Diagnosis Date  . No significant past medical history   . Loss of teeth due to extraction   . PCOS (polycystic ovarian syndrome)   . Anxiety   . Headache(784.0)   . Bronchitis   . Bacterial vaginosis     Pt stated she is prone to BV, "always comes and goes"  . Gestational diabetes 2015  . Pregnancy induced hypertension 2015    Past Surgical History  Procedure Laterality Date  . Mouth surgery    . Tubal ligation Bilateral 07/25/2015    Procedure: POST PARTUM TUBAL LIGATION;  Surgeon: Levie Heritage, DO;  Location: [facility];  Service: Gynecology;  Laterality: Bilateral;    Family History  Problem Relation Age of Onset  . Cancer Other   . Diabetes Maternal Grandmother   . Hypertension Maternal Grandmother   . Cancer Maternal Grandfather     Social History  Substance Use Topics  . Smoking status: Former Smoker    Quit date: 10/27/2012  . Smokeless tobacco: Never Used  . Alcohol Use: No     Comment: Occassional Use    Allergies:  Allergies  Allergen Reactions  . Latex Rash and Other (See Comments)    Reaction:  Burning     Prescriptions prior to admission  Medication Sig Dispense Refill Last Dose  . ibuprofen (ADVIL,MOTRIN) 600 MG tablet Take 1 tablet (600 mg total) by mouth every 6 (six) hours. 30 tablet 0 07/26/2015 at Unknown time  . oxyCODONE-acetaminophen (PERCOCET/ROXICET) 5-325 MG per tablet Take 1 tablet by mouth every 4 (four) hours as needed (for pain scale 4-7). 20 tablet 0 07/26/2015 at Unknown time  . Prenatal Vit-Fe Fumarate-FA (PRENATAL VITAMINS PLUS) 27-1 MG TABS Take 1 tablet by mouth daily. (Patient  taking differently: Take 1 tablet by mouth at bedtime. ) 30 tablet 11 Past Week at Unknown time    Review of Systems  Constitutional: Negative.   HENT: Negative.   Eyes: Negative.   Respiratory: Negative.   Cardiovascular: Negative.   Gastrointestinal: Negative.   Genitourinary: Negative.   Musculoskeletal: Negative.   Skin: Negative.   Neurological: Negative.   Endo/Heme/Allergies: Negative.   Psychiatric/Behavioral: Negative.    Physical Exam   Blood pressure 134/89, pulse 66, temperature 98.6 F (37 C), temperature source Oral, resp. rate 18, height 5\' 5"  (1.651 m), weight 229 lb 12.8 oz (104.237 kg), SpO2 99 %, unknown if currently breastfeeding.  Physical Exam  Constitutional: She is oriented to person, place, and time. She appears well-developed and well-nourished.  HENT:  Head: Normocephalic.  Neck: Normal range of motion.  Cardiovascular: Normal rate, regular rhythm, normal heart sounds and intact distal pulses.   Respiratory: Effort normal and breath sounds normal.  GI: Soft. Bowel sounds are normal.  Genitourinary: Vagina normal and uterus normal.  Musculoskeletal: Normal range of motion.  Neurological: She is alert and oriented to person, place, and time. She has normal reflexes.  Skin: Skin is warm and dry.  Psychiatric: She has a normal mood and  affect. Her behavior is normal. Judgment and thought content normal.    MAU Course  Procedures  MDM Normal findings  Assessment and Plan  Fundus firm, lochia sm amt dark, uterus non tender. D/c home  Kellie Deleon 07/28/2015, 10:51 PM

## 2015-07-28 NOTE — MAU Note (Signed)
Pt passed a large clot 30 mins ago that was the size of a golf ball. States she is wearing depends and that she changes that about 3 times/day. Seen 2 days ago for pelvic pain. Was prescribed oxycodone and ibuprofen. Pain is still the same.

## 2015-07-28 NOTE — Op Note (Signed)
Janara Klett 07/24/2015 - 07/25/2015  PREOPERATIVE DIAGNOSIS:  Undesired fertility  POSTOPERATIVE DIAGNOSIS:  Undesired fertility  PROCEDURE:  Postpartum Bilateral Tubal Sterilization using Filshie Clips   SURGEON:  Dr Candelaria Celeste  ASSISTANT: Dr Lyndel Safe  ANESTHESIA:  Epidural  COMPLICATIONS:  None immediate.  ESTIMATED BLOOD LOSS:  Less than 20cc.  FLUIDS: 700 cc LR.  URINE OUTPUT:  600 cc of clear urine.  INDICATIONS: 24 y.o. yo G2P2002  with undesired fertility,status post vaginal delivery, desires permanent sterilization. Risks and benefits of procedure discussed with patient including permanence of method, bleeding, infection, injury to surrounding organs and need for additional procedures. Risk failure of 0.5-1% with increased risk of ectopic gestation if pregnancy occurs was also discussed with patient.   FINDINGS:  Normal uterus, tubes, and ovaries.  TECHNIQUE:  The patient was taken to the operating room where her epidural anesthesia was dosed up to surgical level and found to be adequate.  She was then placed in the dorsal supine position and prepped and draped in sterile fashion.  After an adequate timeout was performed, attention was turned to the patient's abdomen where a small transverse skin incision was made under the umbilical fold. The incision was taken down to the layer of fascia using the scalpel, and fascia was incised, and extended bilaterally using Mayo scissors. The peritoneum was entered in a sharp fashion. Attention was then turned to the patient's uterus, and left fallopian tube was identified and followed out to the fimbriated end.  A Filshie clip was placed on the left fallopian tube about 2 cm from the cornual attachment, with care given to incorporate the underlying mesosalpinx.  A similar process was carried out on the rightl side allowing for bilateral tubal sterilization.  Good hemostasis was noted overall.  Local analgesia was drizzled on both  operative sites.The instruments were then removed from the patient's abdomen and the fascial incision was repaired with 0 Vicryl, and the skin was closed with a 3-0 Monocryl subcuticular stitch. The patient tolerated the procedure well.  Sponge, lap, and needle counts were correct times two.  The patient was then taken to the recovery room awake, extubated and in stable condition.   Levie Heritage, DO 07/28/2015 6:34 PM

## 2015-09-02 ENCOUNTER — Encounter: Payer: Self-pay | Admitting: Obstetrics and Gynecology

## 2015-09-02 ENCOUNTER — Ambulatory Visit (INDEPENDENT_AMBULATORY_CARE_PROVIDER_SITE_OTHER): Payer: Medicaid Other | Admitting: Obstetrics and Gynecology

## 2015-09-02 LAB — POCT URINALYSIS DIP (DEVICE)
BILIRUBIN URINE: NEGATIVE
GLUCOSE, UA: NEGATIVE mg/dL
KETONES UR: NEGATIVE mg/dL
LEUKOCYTES UA: NEGATIVE
Nitrite: NEGATIVE
Protein, ur: NEGATIVE mg/dL
SPECIFIC GRAVITY, URINE: 1.015 (ref 1.005–1.030)
UROBILINOGEN UA: 0.2 mg/dL (ref 0.0–1.0)
pH: 6 (ref 5.0–8.0)

## 2015-09-02 MED ORDER — DOCUSATE SODIUM 100 MG PO CAPS: 100.0000 mg | ORAL_CAPSULE | Freq: Two times a day (BID) | ORAL | Status: DC

## 2015-09-02 NOTE — Addendum Note (Signed)
Addended by: Catalina AntiguaONSTANT, Petros Ahart on: 09/02/2015 04:11 PM   Modules accepted: Orders

## 2015-09-02 NOTE — Progress Notes (Signed)
Subjective:     Kellie Deleon is a 24 y.o. female who presents for a postpartum visit. She is 5 weeks postpartum following a spontaneous vaginal delivery. I have fully reviewed the prenatal and intrapartum course. The delivery was at 39 gestational weeks. Outcome: spontaneous vaginal delivery. Anesthesia: epidural. Postpartum course has been uncomplicated. Baby's course has been uncomplicated. Baby is feeding by breast. Bleeding staining only. Bowel function is abnormal: constipated and hemorrhoids. Bladder function is abnormal: pressure and burning with urination. Patient is sexually active. Contraception method is tubal ligation. Postpartum depression screening: negative.     Review of Systems Pertinent items are noted in HPI.   Objective:    There were no vitals taken for this visit.  General:  alert, cooperative and no distress   Breasts:  inspection negative, no nipple discharge or bleeding, no masses or nodularity palpable  Lungs: clear to auscultation bilaterally  Heart:  regular rate and rhythm  Abdomen: soft, non-tender; bowel sounds normal; no masses,  no organomegaly   Vulva:  normal, small 0.5 cm hemorrhoid, soft, non tender to touch  Vagina: normal vagina, no discharge, exudate, lesion, or erythema  Cervix:  multiparous appearance  Corpus: normal size, contour, position, consistency, mobility, non-tender  Adnexa:  normal adnexa and no mass, fullness, tenderness  Rectal Exam: Not performed.        Assessment:     Normal postpartum exam. Pap smear not done at today's visit.   Plan:    1. Contraception: tubal ligation 2. Patient is medically cleared to resume all activities of daily living 3. Rx Colace provided, advised patient to use preparation H and other over the counter remedies. Advised to increase water and fiber intake 4. Urine culture sent. Patient will be contacted with any abnormal results 5. Follow up in: October 2017 for annual exam or as needed.

## 2015-09-03 LAB — URINE CULTURE
Colony Count: NO GROWTH
ORGANISM ID, BACTERIA: NO GROWTH

## 2015-09-05 ENCOUNTER — Encounter: Payer: Self-pay | Admitting: *Deleted

## 2016-01-03 ENCOUNTER — Telehealth (HOSPITAL_COMMUNITY): Payer: Self-pay | Admitting: Lactation Services

## 2016-01-03 NOTE — Telephone Encounter (Signed)
Mom called - concerned about low milk supply. Baby now 32 months old and she has not nursed him for 3 months. Has been pumping about 6 times/day obtaining about 1/2 oz each pumping. Suggested power pumping, Mother Love tea and offering the breast as much as possible when baby is not real hungry.    By Pamelia Hoit, RN

## 2016-01-07 ENCOUNTER — Encounter: Payer: Self-pay | Admitting: *Deleted

## 2016-12-19 ENCOUNTER — Emergency Department (HOSPITAL_COMMUNITY)
Admission: EM | Admit: 2016-12-19 | Discharge: 2016-12-19 | Disposition: A | Discharge status: Home / Self Care | Payer: Medicaid Other | Attending: Emergency Medicine | Admitting: Emergency Medicine

## 2016-12-19 ENCOUNTER — Encounter (HOSPITAL_COMMUNITY): Payer: Self-pay

## 2016-12-19 DIAGNOSIS — Z87891 Personal history of nicotine dependence: Secondary | ICD-10-CM | POA: Insufficient documentation

## 2016-12-19 DIAGNOSIS — Z91040 Latex allergy status: Secondary | ICD-10-CM | POA: Insufficient documentation

## 2016-12-19 DIAGNOSIS — Z7982 Long term (current) use of aspirin: Secondary | ICD-10-CM | POA: Insufficient documentation

## 2016-12-19 DIAGNOSIS — N898 Other specified noninflammatory disorders of vagina: Secondary | ICD-10-CM | POA: Insufficient documentation

## 2016-12-19 LAB — URINALYSIS, ROUTINE W REFLEX MICROSCOPIC
Bilirubin Urine: NEGATIVE
Glucose, UA: NEGATIVE mg/dL
HGB URINE DIPSTICK: NEGATIVE
Ketones, ur: 80 mg/dL — AB
LEUKOCYTES UA: NEGATIVE
NITRITE: NEGATIVE
PROTEIN: NEGATIVE mg/dL
Specific Gravity, Urine: 1.025 (ref 1.005–1.030)
pH: 6 (ref 5.0–8.0)

## 2016-12-19 LAB — WET PREP, GENITAL
Clue Cells Wet Prep HPF POC: NONE SEEN
Sperm: NONE SEEN
Trich, Wet Prep: NONE SEEN
YEAST WET PREP: NONE SEEN

## 2016-12-19 LAB — I-STAT BETA HCG BLOOD, ED (MC, WL, AP ONLY): I-stat hCG, quantitative: <5 m[IU]/mL (ref <5)

## 2016-12-19 NOTE — ED Provider Notes (Signed)
[facility]EM[facility]ENCY DEPT Provider Note   CSN: [id number] Arrival date & time: 12/19/16  1125     History   Chief Complaint Chief Complaint  Patient presents with  . Vaginal Discharge   HPI  Blood pressure 118/78, pulse 83, temperature 98.6 F (37 C), temperature source Oral, resp. rate 16, height 5\' 4"  (1.626 m), weight 93.1 kg, last menstrual period 11/16/2016, SpO2 99 %, unknown if currently breastfeeding.  Kellie Deleon is a 26 y.o. female complaining of Intermittent vaginal itching and discharge over the course of several months she states that she gets bacterial vaginosis intermittently but she's not sure if this is a yeast infection, she has not tried to treat herself for yeast infection at home. [facility] but has not made an appointment. Cannot explain why she chose to have this evaluated today she states the symptoms are not worsening. She denies abdominal pain, rash, lesion, dysuria, hematuria, fever chills.   Past Medical History:  Diagnosis Date  . Anxiety   . Bacterial vaginosis    Pt stated she is prone to BV, "always comes and goes"  . Bronchitis   . Gestational diabetes 2015  . Headache(784.0)   . Loss of teeth due to extraction   . No significant past medical history   . PCOS (polycystic ovarian syndrome)   . Pregnancy induced hypertension 2015    Patient Active Problem List   Diagnosis Date Noted  . Labor and delivery, indication for care 07/24/2015  . PROM (premature rupture of membranes) 07/24/2015  . Group B Streptococcus carrier, +RV culture, currently pregnant 07/16/2015  . Condyloma acuminata 06/27/2015  . History of pre-eclampsia in prior pregnancy, currently pregnant 02/07/2015  . Supervision of low-risk pregnancy 01/31/2015  . Mental disorders of mother, antepartum 09/06/2013    Past Surgical History:  Procedure Laterality Date  . MOUTH SURGERY    . TUBAL LIGATION Bilateral 07/25/2015   Procedure: POST PARTUM TUBAL LIGATION;   Surgeon: Levie Heritage, DO;  Location: [facility];  Service: Gynecology;  Laterality: Bilateral;    OB History    Gravida Para Term Preterm AB Living   2 2 2  0 0 2   SAB TAB Ectopic Multiple Live Births   0 0 0 0 2       Home Medications    Prior to Admission medications   Medication Sig Start Date End Date Taking? Authorizing Provider  aspirin-acetaminophen-caffeine (EXCEDRIN MIGRAINE) [phone] MG tablet Take 1 tablet by mouth every 6 (six) hours as needed for headache.   Yes Historical Provider, MD  docusate sodium (COLACE) 100 MG capsule Take 1 capsule (100 mg total) by mouth 2 (two) times daily. Patient not taking: Reported on 12/19/2016 09/02/15   Catalina Antigua, MD  ibuprofen (ADVIL,MOTRIN) 600 MG tablet Take 1 tablet (600 mg total) by mouth every 6 (six) hours. Patient not taking: Reported on 09/02/2015 07/26/15   Arabella Merles, CNM  oxyCODONE-acetaminophen (PERCOCET/ROXICET) 5-325 MG per tablet Take 1 tablet by mouth every 4 (four) hours as needed (for pain scale 4-7). Patient not taking: Reported on 09/02/2015 07/26/15   Arabella Merles, CNM  Prenatal Vit-Fe Fumarate-FA (PRENATAL VITAMINS PLUS) 27-1 MG TABS Take 1 tablet by mouth daily. Patient not taking: Reported on 12/19/2016 12/26/14   Willodean Rosenthal, MD    Family History Family History  Problem Relation Age of Onset  . Cancer Other   . Diabetes Maternal Grandmother   . Hypertension Maternal Grandmother   . Cancer Maternal  Grandfather     Social History Social History  Substance Use Topics  . Smoking status: Former Smoker    Types: Cigarettes    Quit date: 10/27/2012  . Smokeless tobacco: Never Used  . Alcohol use Yes     Comment: Occassional Use     Allergies   Latex   Review of Systems Review of Systems  10 systems reviewed and found to be negative, except as noted in the HPI.  Physical Exam Updated Vital Signs BP 121/85 (BP Location: Right Arm)   Pulse 83   Temp 98.5 F (36.9 C) (Oral)    Resp 16   Ht 5\' 4"  (1.626 m)   Wt 93.1 kg   LMP 11/16/2016   SpO2 100%   BMI 35.22 kg/m   Physical Exam  Constitutional: She is oriented to person, place, and time. She appears well-developed and well-nourished. No distress.  HENT:  Head: Normocephalic and atraumatic.  Mouth/Throat: Oropharynx is clear and moist.  Eyes: Conjunctivae and EOM are normal. Pupils are equal, round, and reactive to light.  Neck: Normal range of motion.  Cardiovascular: Normal rate, regular rhythm and intact distal pulses.   Pulmonary/Chest: Effort normal and breath sounds normal. No stridor.  Abdominal: Soft. Bowel sounds are normal. She exhibits no distension and no mass. There is no tenderness. There is no rebound and no guarding.  Genitourinary:  Genitourinary Comments: No CVA tenderness to percussion bilaterally  Pelvic exam a chaperoned by technician: No rashes or lesions, scant, white, homogenous, non-foul-smelling vaginal discharge. No cervical motion or adnexal tenderness.    Musculoskeletal: Normal range of motion.  Neurological: She is alert and oriented to person, place, and time.  Skin: She is not diaphoretic.  Psychiatric: She has a normal mood and affect.  Nursing note and vitals reviewed.    ED Treatments / Results  Labs (all labs ordered are listed, but only abnormal results are displayed) Labs Reviewed  WET PREP, GENITAL - Abnormal; Notable for the following:       Result Value   WBC, Wet Prep HPF POC FEW (*)    All other components within normal limits  URINALYSIS, ROUTINE W REFLEX MICROSCOPIC - Abnormal; Notable for the following:    APPearance HAZY (*)    Ketones, ur 80 (*)    All other components within normal limits  RPR  HIV ANTIBODY (ROUTINE TESTING)  I-STAT BETA HCG BLOOD, ED ([facility], [facility], [facility] ONLY)  GC/CHLAMYDIA PROBE AMP ([place]) NOT AT [facility]    EKG  EKG Interpretation None       Radiology No results found.  Procedures Procedures (including critical care  time)  Medications Ordered in ED Medications - No data to display   Initial Impression / Assessment and Plan / ED Course  I have reviewed the triage vital signs and the nursing notes.  Pertinent labs & imaging results that were available during my care of the patient were reviewed by me and considered in my medical decision making (see chart for details).     Vitals:   12/19/16 1300 12/19/16 1330 12/19/16 1345 12/19/16 1608  BP: 125/79 126/86 127/73 121/85  Pulse: 96 76 68 83  Resp:    16  Temp:    98.5 F (36.9 C)  TempSrc:    Oral  SpO2: 100% 100% 100% 100%  Weight:      Height:        Medications - No data to display  Kellie Deleon is 25 y.o. female  presenting with Vaginal irritation intermittently over the course of several months, physical exam is not consistent with PID, wet prep reassuring. Urinalysis without signs of infection. Advised to follow-up [facility].   Evaluation does not show pathology that would require ongoing emergent intervention or inpatient treatment. Pt is hemodynamically stable and mentating appropriately. Discussed findings and plan with patient/guardian, who agrees with care plan. All questions answered. Return precautions discussed and outpatient follow up given.   Final Clinical Impressions(s) / ED Diagnoses   Final diagnoses:  Vaginal discharge    New Prescriptions Discharge Medication List as of 12/19/2016  3:25 PM       Wynetta EmeryNicole Agustine Rossitto, PA-C 12/20/16 0732    Jacalyn LefevreJulie Haviland, MD 12/20/16 [phone]

## 2016-12-19 NOTE — ED Triage Notes (Signed)
Onset 2 months off and on vaginal itching, vaginal discharge- yellow, thick, odor, and vaginal swelling.

## 2016-12-19 NOTE — Discharge Instructions (Signed)
Please follow with your primary care doctor in the next 2 days for a check-up. They must obtain records for further management.  ° °Do not hesitate to return to the Emergency Department for any new, worsening or concerning symptoms.  ° °

## 2016-12-19 NOTE — ED Notes (Signed)
RN attempted to collect blood work

## 2016-12-20 LAB — RPR: RPR Ser Ql: NONREACTIVE

## 2016-12-20 LAB — HIV ANTIBODY (ROUTINE TESTING W REFLEX): HIV SCREEN 4TH GENERATION: NONREACTIVE

## 2016-12-21 ENCOUNTER — Encounter: Payer: Self-pay | Admitting: Obstetrics & Gynecology

## 2016-12-21 ENCOUNTER — Ambulatory Visit (INDEPENDENT_AMBULATORY_CARE_PROVIDER_SITE_OTHER): Payer: Self-pay | Admitting: Obstetrics & Gynecology

## 2016-12-21 VITALS — BP 124/84 | HR 94 | Wt 206.9 lb

## 2016-12-21 DIAGNOSIS — B3731 Acute candidiasis of vulva and vagina: Secondary | ICD-10-CM

## 2016-12-21 DIAGNOSIS — B373 Candidiasis of vulva and vagina: Secondary | ICD-10-CM

## 2016-12-21 LAB — GC/CHLAMYDIA PROBE AMP (~~LOC~~) NOT AT ARMC
Chlamydia: NEGATIVE
Neisseria Gonorrhea: NEGATIVE

## 2016-12-21 MED ORDER — FLUCONAZOLE 150 MG PO TABS: 150.0000 mg | ORAL_TABLET | Freq: Once | ORAL | 0 refills | Status: AC

## 2016-12-21 NOTE — Progress Notes (Signed)
Subjective:     Patient ID: Kellie Deleon, female   DOB: [birth year], 26 y.o.   MRN: [id number] Cc: vaginal irritation d/c and itch [id number] Patient's last menstrual period was 11/17/2016 (approximate).  She was seen in ED 2 days ago for the above sx and no dx was made and no treatment offered.  Allergies  Allergen Reactions  . Latex Rash and Other (See Comments)    Reaction:  Burning    Current Outpatient Prescriptions on File Prior to Visit  Medication Sig Dispense Refill  . aspirin-acetaminophen-caffeine (EXCEDRIN MIGRAINE) 250-250-65 MG tablet Take 1 tablet by mouth every 6 (six) hours as needed for headache.    . docusate sodium (COLACE) 100 MG capsule Take 1 capsule (100 mg total) by mouth 2 (two) times daily. (Patient not taking: Reported on 12/19/2016) 30 capsule 2  . ibuprofen (ADVIL,MOTRIN) 600 MG tablet Take 1 tablet (600 mg total) by mouth every 6 (six) hours. (Patient not taking: Reported on 09/02/2015) 30 tablet 0  . oxyCODONE-acetaminophen (PERCOCET/ROXICET) 5-325 MG per tablet Take 1 tablet by mouth every 4 (four) hours as needed (for pain scale 4-7). (Patient not taking: Reported on 09/02/2015) 20 tablet 0  . Prenatal Vit-Fe Fumarate-FA (PRENATAL VITAMINS PLUS) 27-1 MG TABS Take 1 tablet by mouth daily. (Patient not taking: Reported on 12/19/2016) 30 tablet 11   No current facility-administered medications on file prior to visit.      Review of Systems  Genitourinary: Positive for vaginal discharge. Negative for dysuria, menstrual problem, pelvic pain and vaginal bleeding.       Objective:   Physical Exam  Constitutional: No distress.  Genitourinary: Vaginal discharge (vaginal erythema and white d/c c/w yeast, no odor) found.       Assessment:     Yeast vaginitis    Plan:     Diflucan 150 mg PO x 1  Adam PhenixJames G Khadejah Son, MD 12/21/2016

## 2017-01-26 ENCOUNTER — Emergency Department (HOSPITAL_COMMUNITY): Payer: Self-pay

## 2017-01-26 ENCOUNTER — Encounter (HOSPITAL_COMMUNITY): Payer: Self-pay | Admitting: Emergency Medicine

## 2017-01-26 ENCOUNTER — Emergency Department (HOSPITAL_COMMUNITY)
Admission: EM | Admit: 2017-01-26 | Discharge: 2017-01-26 | Disposition: A | Discharge status: Home / Self Care | Payer: Self-pay | Attending: Emergency Medicine | Admitting: Emergency Medicine

## 2017-01-26 DIAGNOSIS — Z87891 Personal history of nicotine dependence: Secondary | ICD-10-CM | POA: Insufficient documentation

## 2017-01-26 DIAGNOSIS — Z91040 Latex allergy status: Secondary | ICD-10-CM | POA: Insufficient documentation

## 2017-01-26 DIAGNOSIS — K8020 Calculus of gallbladder without cholecystitis without obstruction: Secondary | ICD-10-CM | POA: Insufficient documentation

## 2017-01-26 DIAGNOSIS — Z7982 Long term (current) use of aspirin: Secondary | ICD-10-CM | POA: Insufficient documentation

## 2017-01-26 LAB — URINALYSIS, ROUTINE W REFLEX MICROSCOPIC
BILIRUBIN URINE: NEGATIVE
Glucose, UA: NEGATIVE mg/dL
Hgb urine dipstick: NEGATIVE
Ketones, ur: NEGATIVE mg/dL
Leukocytes, UA: NEGATIVE
NITRITE: NEGATIVE
PH: 5 (ref 5.0–8.0)
Protein, ur: NEGATIVE mg/dL
SPECIFIC GRAVITY, URINE: 1.019 (ref 1.005–1.030)

## 2017-01-26 LAB — CBC
HEMATOCRIT: 42.2 % (ref 36.0–46.0)
HEMOGLOBIN: 13.5 g/dL (ref 12.0–15.0)
MCH: 29.9 pg (ref 26.0–34.0)
MCHC: 32 g/dL (ref 30.0–36.0)
MCV: 93.4 fL (ref 78.0–100.0)
Platelets: 242 10*3/uL (ref 150–400)
RBC: 4.52 MIL/uL (ref 3.87–5.11)
RDW: 12.1 % (ref 11.5–15.5)
WBC: 6.7 10*3/uL (ref 4.0–10.5)

## 2017-01-26 LAB — COMPREHENSIVE METABOLIC PANEL
ALBUMIN: 4.1 g/dL (ref 3.5–5.0)
ALT: 14 U/L (ref 14–54)
ANION GAP: 8 (ref 5–15)
AST: 17 U/L (ref 15–41)
Alkaline Phosphatase: 56 U/L (ref 38–126)
BUN: 7 mg/dL (ref 6–20)
CO2: 25 mmol/L (ref 22–32)
Calcium: 9.3 mg/dL (ref 8.9–10.3)
Chloride: 107 mmol/L (ref 101–111)
Creatinine, Ser: 0.8 mg/dL (ref 0.44–1.00)
GFR calc non Af Amer: >60 mL/min (ref >60)
GLUCOSE: 110 mg/dL — AB (ref 65–99)
POTASSIUM: 3.6 mmol/L (ref 3.5–5.1)
SODIUM: 140 mmol/L (ref 135–145)
TOTAL PROTEIN: 6.8 g/dL (ref 6.5–8.1)
Total Bilirubin: 1 mg/dL (ref 0.3–1.2)

## 2017-01-26 LAB — LIPASE, BLOOD: Lipase: 18 U/L (ref 11–51)

## 2017-01-26 LAB — POC URINE PREG, ED: PREG TEST UR: NEGATIVE

## 2017-01-26 MED ORDER — ONDANSETRON HCL 4 MG PO TABS: 4.0000 mg | ORAL_TABLET | Freq: Four times a day (QID) | ORAL | 0 refills | Status: DC

## 2017-01-26 MED ORDER — HYDROCODONE-ACETAMINOPHEN 5-325 MG PO TABS: 1.0000 | ORAL_TABLET | Freq: Four times a day (QID) | ORAL | 0 refills | Status: DC | PRN

## 2017-01-26 MED ORDER — MORPHINE SULFATE (PF) 4 MG/ML IV SOLN
4.0000 mg | Freq: Once | INTRAVENOUS | Status: AC
  Administered 2017-01-26: 4 mg via INTRAVENOUS
  Filled 2017-01-26: qty 1

## 2017-01-26 MED ORDER — ONDANSETRON HCL 4 MG/2ML IJ SOLN
4.0000 mg | Freq: Once | INTRAMUSCULAR | Status: AC
  Administered 2017-01-26: 4 mg via INTRAVENOUS
  Filled 2017-01-26: qty 2

## 2017-01-26 NOTE — ED Notes (Signed)
Pt ambulated to restroom, pt had no complaints.  

## 2017-01-26 NOTE — ED Provider Notes (Signed)
[facility] Provider Note   CSN: [id number] Arrival date & time: 01/26/17  [id number]     History   Chief Complaint Chief Complaint  Patient presents with  . Abdominal Pain  . Constipation    HPI Kellie Deleon is a 26 y.o. female.  HPI Kellie Deleon is a 26 y.o. female with hx of anxiety, PCOS, presents to ED with complaint of abdominal pain. Pt states pain started at 2 am and woke her up from sleep. Pain is sharp, in right upper abdomen, radiating into right flank. Reports associated nausea, no vomiting. No urinary symptoms. No vaginal discharge or bleeding. Denies pregnancy. No hx of similar pain in the past. Did not take any medications prior to coming in. She states she felt like she needed to have a bowel movement this morning, but states "it was very small and hard." States any movement and palpating abdomen makes pain worse, nothing makes it better.  Past Medical History:  Diagnosis Date  . Anxiety   . Bacterial vaginosis    Pt stated she is prone to BV, "always comes and goes"  . Bronchitis   . Gestational diabetes 2015  . Headache(784.0)   . Loss of teeth due to extraction   . No significant past medical history   . PCOS (polycystic ovarian syndrome)   . Pregnancy induced hypertension 2015    Patient Active Problem List   Diagnosis Date Noted  . Labor and delivery, indication for care 07/24/2015  . PROM (premature rupture of membranes) 07/24/2015  . Group B Streptococcus carrier, +RV culture, currently pregnant 07/16/2015  . Condyloma acuminata 06/27/2015  . History of pre-eclampsia in prior pregnancy, currently pregnant 02/07/2015  . Supervision of low-risk pregnancy 01/31/2015  . Mental disorders of mother, antepartum 09/06/2013    Past Surgical History:  Procedure Laterality Date  . MOUTH SURGERY    . TUBAL LIGATION Bilateral 07/25/2015   Procedure: POST PARTUM TUBAL LIGATION;  Surgeon: Levie HeritageJacob J Stinson, DO;  Location: [facility];  Service: Gynecology;   Laterality: Bilateral;    OB History    Gravida Para Term Preterm AB Living   2 2 2  0 0 2   SAB TAB Ectopic Multiple Live Births   0 0 0 0 2       Home Medications    Prior to Admission medications   Medication Sig Start Date End Date Taking? Authorizing Provider  aspirin-acetaminophen-caffeine (EXCEDRIN MIGRAINE) [phone] MG tablet Take 1 tablet by mouth every 6 (six) hours as needed for headache.    Historical Provider, MD  docusate sodium (COLACE) 100 MG capsule Take 1 capsule (100 mg total) by mouth 2 (two) times daily. Patient not taking: Reported on 12/19/2016 09/02/15   Catalina AntiguaPeggy Constant, MD  ibuprofen (ADVIL,MOTRIN) 600 MG tablet Take 1 tablet (600 mg total) by mouth every 6 (six) hours. Patient not taking: Reported on 09/02/2015 07/26/15   Arabella MerlesKimberly D Shaw, CNM  oxyCODONE-acetaminophen (PERCOCET/ROXICET) 5-325 MG per tablet Take 1 tablet by mouth every 4 (four) hours as needed (for pain scale 4-7). Patient not taking: Reported on 09/02/2015 07/26/15   Arabella MerlesKimberly D Shaw, CNM  Prenatal Vit-Fe Fumarate-FA (PRENATAL VITAMINS PLUS) 27-1 MG TABS Take 1 tablet by mouth daily. Patient not taking: Reported on 12/19/2016 12/26/14   Willodean Rosenthalarolyn Harraway-Smith, MD    Family History Family History  Problem Relation Age of Onset  . Cancer Other   . Diabetes Maternal Grandmother   . Hypertension Maternal Grandmother   . Cancer Maternal Grandfather  Social History Social History  Substance Use Topics  . Smoking status: Former Smoker    Types: Cigarettes    Quit date: 10/27/2012  . Smokeless tobacco: Never Used  . Alcohol use Yes     Comment: Occassional Use     Allergies   Latex   Review of Systems Review of Systems  Constitutional: Negative for chills and fever.  Respiratory: Negative for cough, chest tightness and shortness of breath.   Cardiovascular: Negative for chest pain, palpitations and leg swelling.  Gastrointestinal: Positive for abdominal pain and nausea. Negative for  diarrhea and vomiting.  Genitourinary: Negative for dysuria, flank pain, pelvic pain, vaginal bleeding, vaginal discharge and vaginal pain.  Musculoskeletal: Negative for arthralgias, myalgias, neck pain and neck stiffness.  Skin: Negative for rash.  Neurological: Negative for dizziness, weakness and headaches.  All other systems reviewed and are negative.    Physical Exam Updated Vital Signs BP 114/81 (BP Location: Left Arm)   Pulse 83   Temp 98.1 F (36.7 C) (Oral)   Resp 14   Ht 5\' 3"  (1.6 m)   Wt 90.3 kg   SpO2 100%   BMI 35.25 kg/m   Physical Exam  Constitutional: She appears well-developed and well-nourished. No distress.  HENT:  Head: Normocephalic.  Eyes: Conjunctivae are normal.  Neck: Neck supple.  Cardiovascular: Normal rate, regular rhythm and normal heart sounds.   Pulmonary/Chest: Effort normal and breath sounds normal. No respiratory distress. She has no wheezes. She has no rales.  Abdominal: Soft. Bowel sounds are normal. She exhibits no distension. There is tenderness. There is no rebound.  RUQ tenderness. Right CVA tenderness.   Musculoskeletal: She exhibits no edema.  Neurological: She is alert.  Skin: Skin is warm and dry.  Psychiatric: She has a normal mood and affect. Her behavior is normal.  Nursing note and vitals reviewed.    ED Treatments / Results  Labs (all labs ordered are listed, but only abnormal results are displayed) Labs Reviewed  LIPASE, BLOOD  COMPREHENSIVE METABOLIC PANEL  CBC  URINALYSIS, ROUTINE W REFLEX MICROSCOPIC  POC URINE PREG, ED    EKG  EKG Interpretation None       Radiology US Abdomen Complete  Result Date: 01/26/2017 CLINICAL DATA:  Upper abdominal pain EXAM: ABDOMEN ULTRASOUND COMPLETE COMPARISON:  None. FINDINGS: Gallbladder: A 4 mm calculus is noted within the gallbladder which moves and shadows. Gallbladder wall is not appreciably thickened. A a slight amount of pericholecystic fluid is appreciable,  however. No sonographic Murphy sign noted by sonographer. Note that the patient received pain medication shortly prior to this examination. Common bile duct: Diameter: 4 mm. No intrahepatic, common hepatic, or common bile duct dilatation. Liver: No focal lesion identified. Within normal limits in parenchymal echogenicity. IVC: No abnormality visualized. Pancreas: No mass or inflammatory focus. Spleen: Size and appearance within normal limits. Right Kidney: Length: 12.2 cm. Echogenicity within normal limits. No mass or hydronephrosis visualized. Left Kidney: Length: 12.1 cm. Echogenicity within normal limits. No mass or hydronephrosis visualized. Abdominal aorta: No aneurysm visualized. Other findings: No demonstrable ascites. IMPRESSION: There is a small gallstone in the gallbladder. There is minimal pericholecystic fluid. Gallbladder wall does not appear appreciably thickened. The possibility of early cholecystitis must be of concern given findings as noted. In this regard, it may be prudent to consider nuclear medicine hepatobiliary imaging study to assess for cystic duct patency. Study otherwise unremarkable. Electronically Signed   By: Bretta Bang III M.D.   On: 01/26/2017  09:10    Procedures Procedures (including critical care time)  Medications Ordered in ED Medications  morphine 4 MG/ML injection 4 mg (not administered)  ondansetron (ZOFRAN) injection 4 mg (not administered)     Initial Impression / Assessment and Plan / ED Course  I have reviewed the triage vital signs and the nursing notes.  Pertinent labs & imaging results that were available during my care of the patient were reviewed by me and considered in my medical decision making (see chart for details).   patient in emergency department with right upper quadrant pain and right flank pain, sudden onset early this morning. Patient is tearful. Will give pain medications, check labs, check urinalysis. VS normal.   9:36 AM Pain  significantly better. Not vomiting. US showing small gallstone in gallbladder with no gallbladder wall thickening, no evidence of sonographic murphy's sign, normal CBD. There was a note of minimal pericholecystic fluid. Pt's WBC is 6.7, Normal lipase and LFTs. I have low suspicion for cholecystitis. In setting of improved pain, I think it is appropriate for dc and outpatient follow up. Will provide pain medication and antiemetics. Discussed low-fat diet. Discussed following up with general surgery. Discussed return precautions if worsening pain and able to tolerate oral fluids or if developed fever.  Vitals:   01/26/17 0724 01/26/17 0730 01/26/17 0800 01/26/17 0928  BP: 129/90 122/84 117/75 118/85  Pulse: 79 (!) 55 (!) 58 88  Resp: 14   15  Temp:      TempSrc:      SpO2: 100% 100% 98% 98%  Weight:      Height:          Final Clinical Impressions(s) / ED Diagnoses   Final diagnoses:  Calculus of gallbladder without cholecystitis without obstruction    New Prescriptions New Prescriptions   HYDROCODONE-ACETAMINOPHEN (NORCO) 5-325 MG TABLET    Take 1 tablet by mouth every 6 (six) hours as needed for moderate pain.   ONDANSETRON (ZOFRAN) 4 MG TABLET    Take 1 tablet (4 mg total) by mouth every 6 (six) hours.     Jaynie Crumble, PA-C 01/26/17 1504    Gwyneth Sprout, MD 01/26/17 2146

## 2017-01-26 NOTE — ED Triage Notes (Signed)
Pt presents with new onset right sided abd pain since this morning when she attempted to have a BM; pt states small BM this morning but continues to have pain

## 2017-01-26 NOTE — Discharge Instructions (Signed)
Your ultrasound showed a gallbladder stone. Take tylenol for pain. Norco for severe pain only. Take zofran for nausea as needed. Low fat diet. If pain not improving, follow up with general surgery as referred. If pain worsening, or develop intractable vomiting, fever, return to ED

## 2017-06-13 ENCOUNTER — Encounter (HOSPITAL_COMMUNITY): Payer: Self-pay | Admitting: Emergency Medicine

## 2017-06-13 ENCOUNTER — Emergency Department (HOSPITAL_COMMUNITY): Payer: Self-pay

## 2017-06-13 ENCOUNTER — Emergency Department (HOSPITAL_COMMUNITY)
Admission: EM | Admit: 2017-06-13 | Discharge: 2017-06-13 | Disposition: A | Discharge status: Home / Self Care | Payer: Self-pay | Attending: Emergency Medicine | Admitting: Emergency Medicine

## 2017-06-13 DIAGNOSIS — Z91040 Latex allergy status: Secondary | ICD-10-CM | POA: Insufficient documentation

## 2017-06-13 DIAGNOSIS — F17210 Nicotine dependence, cigarettes, uncomplicated: Secondary | ICD-10-CM | POA: Insufficient documentation

## 2017-06-13 DIAGNOSIS — F419 Anxiety disorder, unspecified: Secondary | ICD-10-CM | POA: Insufficient documentation

## 2017-06-13 DIAGNOSIS — K8020 Calculus of gallbladder without cholecystitis without obstruction: Secondary | ICD-10-CM | POA: Insufficient documentation

## 2017-06-13 DIAGNOSIS — R1011 Right upper quadrant pain: Secondary | ICD-10-CM

## 2017-06-13 LAB — CBC
HEMATOCRIT: 43.1 % (ref 36.0–46.0)
HEMOGLOBIN: 14.5 g/dL (ref 12.0–15.0)
MCH: 31.9 pg (ref 26.0–34.0)
MCHC: 33.6 g/dL (ref 30.0–36.0)
MCV: 94.7 fL (ref 78.0–100.0)
Platelets: 260 10*3/uL (ref 150–400)
RBC: 4.55 MIL/uL (ref 3.87–5.11)
RDW: 13.4 % (ref 11.5–15.5)
WBC: 11.8 10*3/uL — ABNORMAL HIGH (ref 4.0–10.5)

## 2017-06-13 LAB — COMPREHENSIVE METABOLIC PANEL
ALK PHOS: 60 U/L (ref 38–126)
ALT: 8 U/L — AB (ref 14–54)
AST: 17 U/L (ref 15–41)
Albumin: 4.2 g/dL (ref 3.5–5.0)
Anion gap: 10 (ref 5–15)
BUN: 6 mg/dL (ref 6–20)
CO2: 23 mmol/L (ref 22–32)
Calcium: 9.2 mg/dL (ref 8.9–10.3)
Chloride: 105 mmol/L (ref 101–111)
Creatinine, Ser: 0.72 mg/dL (ref 0.44–1.00)
GLUCOSE: 92 mg/dL (ref 65–99)
Potassium: 3.7 mmol/L (ref 3.5–5.1)
Sodium: 138 mmol/L (ref 135–145)
TOTAL PROTEIN: 7.2 g/dL (ref 6.5–8.1)
Total Bilirubin: 1.1 mg/dL (ref 0.3–1.2)

## 2017-06-13 LAB — LIPASE, BLOOD: Lipase: 34 U/L (ref 11–51)

## 2017-06-13 MED ORDER — HYDROCODONE-ACETAMINOPHEN 5-325 MG PO TABS: 1.0000 | ORAL_TABLET | ORAL | 0 refills | Status: DC | PRN

## 2017-06-13 MED ORDER — SODIUM CHLORIDE 0.9 % IV BOLUS (SEPSIS)
1000.0000 mL | Freq: Once | INTRAVENOUS | Status: AC
  Administered 2017-06-13: 1000 mL via INTRAVENOUS

## 2017-06-13 MED ORDER — MORPHINE SULFATE (PF) 4 MG/ML IV SOLN
4.0000 mg | Freq: Once | INTRAVENOUS | Status: AC
  Administered 2017-06-13: 4 mg via INTRAVENOUS
  Filled 2017-06-13: qty 1

## 2017-06-13 NOTE — Discharge Instructions (Signed)
Please read and follow all provided instructions.  Your diagnoses today include:  1. Calculus of gallbladder without cholecystitis without obstruction   2. RUQ pain     Tests performed today include: Vital signs. See below for your results today.   Medications prescribed:  Take as prescribed   Home care instructions:  Follow any educational materials contained in this packet.  Follow-up instructions: Please follow-up with General Surgery for further evaluation of symptoms and treatment   Return instructions:  Please return to the Emergency Department if you do not get better, if you get worse, or new symptoms OR  - Fever (temperature greater than 101.39F)  - Bleeding that does not stop with holding pressure to the area    -Severe pain (please note that you may be more sore the day after your accident)  - Chest Pain  - Difficulty breathing  - Severe nausea or vomiting  - Inability to tolerate food and liquids  - Passing out  - Skin becoming red around your wounds  - Change in mental status (confusion or lethargy)  - New numbness or weakness    Please return if you have any other emergent concerns.  Additional Information:  Your vital signs today were: BP 118/70 (BP Location: Right Arm)    Pulse 93    Temp 98.4 F (36.9 C) (Oral)    Resp 16    Ht 5\' 4"  (1.626 m)    Wt 77.3 kg (170 lb 8 oz)    LMP 04/16/2017    SpO2 100%    BMI 29.27 kg/m  If your blood pressure (BP) was elevated above 135/85 this visit, please have this repeated by your doctor within one month. ---------------

## 2017-06-13 NOTE — ED Provider Notes (Signed)
[facility] Provider Note   CSN: [id number] Arrival date & time: 06/13/17  1558     History   Chief Complaint Chief Complaint  Patient presents with  . Abdominal Pain    HPI Kellie Deleon is a 26 y.o. female.  HPI  26 y.o. female, presents to the Emergency Department today due to RUQ pain x 1 month. Associated N/V x 3 today. States gradually worsening pain, but patient has tried to hold on as long as possible. Unable to follow up with General Surgery due to insurance. Pt states she has N/V with PO intake. No fevers. No CP/SOB. No meds PTA. Last PO intake last night due to emesis. Rates pain 8/10. Cramping sensation isolated to RUQ. No other symptoms noted.   Past Medical History:  Diagnosis Date  . Anxiety   . Bacterial vaginosis    Pt stated she is prone to BV, "always comes and goes"  . Bronchitis   . Gestational diabetes 2015  . Headache(784.0)   . Loss of teeth due to extraction   . No significant past medical history   . PCOS (polycystic ovarian syndrome)   . Pregnancy induced hypertension 2015    Patient Active Problem List   Diagnosis Date Noted  . Labor and delivery, indication for care 07/24/2015  . PROM (premature rupture of membranes) 07/24/2015  . Group B Streptococcus carrier, +RV culture, currently pregnant 07/16/2015  . Condyloma acuminata 06/27/2015  . History of pre-eclampsia in prior pregnancy, currently pregnant 02/07/2015  . Supervision of low-risk pregnancy 01/31/2015  . Mental disorders of mother, antepartum 09/06/2013    Past Surgical History:  Procedure Laterality Date  . MOUTH SURGERY    . TUBAL LIGATION Bilateral 07/25/2015   Procedure: POST PARTUM TUBAL LIGATION;  Surgeon: Levie HeritageJacob J Stinson, DO;  Location: [facility];  Service: Gynecology;  Laterality: Bilateral;    OB History    Gravida Para Term Preterm AB Living   2 2 2  0 0 2   SAB TAB Ectopic Multiple Live Births   0 0 0 0 2       Home Medications    Prior to Admission  medications   Medication Sig Start Date End Date Taking? Authorizing Provider  aspirin-acetaminophen-caffeine (EXCEDRIN MIGRAINE) [phone] MG tablet Take 1 tablet by mouth every 6 (six) hours as needed for headache.    [provider]  docusate sodium (COLACE) 100 MG capsule Take 1 capsule (100 mg total) by mouth 2 (two) times daily. Patient not taking: Reported on 12/19/2016 09/02/15   Constant, Peggy, MD  HYDROcodone-acetaminophen (NORCO) 5-325 MG tablet Take 1 tablet by mouth every 6 (six) hours as needed for moderate pain. 01/26/17   Kirichenko, Tatyana, PA-C  ibuprofen (ADVIL,MOTRIN) 600 MG tablet Take 1 tablet (600 mg total) by mouth every 6 (six) hours. Patient not taking: Reported on 09/02/2015 07/26/15   Arabella MerlesShaw, Kimberly D, CNM  ondansetron (ZOFRAN) 4 MG tablet Take 1 tablet (4 mg total) by mouth every 6 (six) hours. 01/26/17   Kirichenko, Lemont Fillersatyana, PA-C  oxyCODONE-acetaminophen (PERCOCET/ROXICET) 5-325 MG per tablet Take 1 tablet by mouth every 4 (four) hours as needed (for pain scale 4-7). Patient not taking: Reported on 09/02/2015 07/26/15   Arabella MerlesShaw, Kimberly D, CNM  Prenatal Vit-Fe Fumarate-FA (PRENATAL VITAMINS PLUS) 27-1 MG TABS Take 1 tablet by mouth daily. Patient not taking: Reported on 12/19/2016 12/26/14   Willodean RosenthalHarraway-Smith, Carolyn, MD    Family History Family History  Problem Relation Age of Onset  . Cancer Other   .  Diabetes Maternal Grandmother   . Hypertension Maternal Grandmother   . Cancer Maternal Grandfather     Social History Social History  Substance Use Topics  . Smoking status: Current Every Day Smoker    Types: Cigarettes  . Smokeless tobacco: Never Used  . Alcohol use Yes     Comment: Occassional Use     Allergies   Latex   Review of Systems Review of Systems ROS reviewed and all are negative for acute change except as noted in the HPI.  Physical Exam Updated Vital Signs BP (!) 129/94 (BP Location: Right Arm)   Pulse 80   Temp 98.4 F (36.9 C)  (Oral)   Resp 16   Ht 5\' 4"  (1.626 m)   Wt 77.3 kg (170 lb 8 oz)   LMP 04/16/2017   SpO2 100%   BMI 29.27 kg/m   Physical Exam  Constitutional: She is oriented to person, place, and time. Vital signs are normal. She appears well-developed and well-nourished.  NAD  HENT:  Head: Normocephalic and atraumatic.  Right Ear: Hearing normal.  Left Ear: Hearing normal.  Eyes: Pupils are equal, round, and reactive to light. Conjunctivae and EOM are normal.  Neck: Normal range of motion.  Cardiovascular: Normal rate, regular rhythm, normal heart sounds and intact distal pulses.   Pulmonary/Chest: Effort normal and breath sounds normal.  Abdominal: Soft. There is tenderness in the right upper quadrant. There is no rigidity, no rebound, no guarding, no CVA tenderness, no tenderness at McBurney's point and negative Murphy's sign.  Musculoskeletal: Normal range of motion.  Neurological: She is alert and oriented to person, place, and time.  Skin: Skin is warm and dry.  Psychiatric: She has a normal mood and affect. Her speech is normal and behavior is normal. Thought content normal.  Nursing note and vitals reviewed.   ED Treatments / Results  Labs (all labs ordered are listed, but only abnormal results are displayed) Labs Reviewed  COMPREHENSIVE METABOLIC PANEL - Abnormal; Notable for the following:       Result Value   ALT 8 (*)    All other components within normal limits  CBC - Abnormal; Notable for the following:    WBC 11.8 (*)    All other components within normal limits  LIPASE, BLOOD  URINALYSIS, ROUTINE W REFLEX MICROSCOPIC  POC URINE PREG, ED    EKG  EKG Interpretation None       Radiology [place] Abdomen Limited Ruq  Result Date: 06/13/2017 CLINICAL DATA:  Right upper quadrant pain EXAM: ULTRASOUND ABDOMEN LIMITED RIGHT UPPER QUADRANT COMPARISON:  January 26, 2017 FINDINGS: Gallbladder: At least 2 shadowing stones are seen in the gallbladder with the largest measuring 9  mm. No wall thickening or pericholecystic fluid. No Murphy's sign. Common bile duct: Diameter: 3.4 mm Liver: No focal lesion identified. Within normal limits in parenchymal echogenicity. IMPRESSION: Cholelithiasis. No wall thickening, pericholecystic fluid, or Murphy's sign. Electronically Signed   By: Gerome Samavid  Williams III M.D   On: 06/13/2017 18:19    Procedures Procedures (including critical care time)  Medications Ordered in ED Medications  sodium chloride 0.9 % bolus 1,000 mL (1,000 mLs Intravenous New Bag/Given 06/13/17 1740)  morphine 4 MG/ML injection 4 mg (4 mg Intravenous Given 06/13/17 1740)     Initial Impression / Assessment and Plan / ED Course  I have reviewed the triage vital signs and the nursing notes.  Pertinent labs & imaging results that were available during my care of the patient  were reviewed by me and considered in my medical decision making (see chart for details).  Final Clinical Impressions(s) / ED Diagnoses  {I have reviewed and evaluated the relevant laboratory values. {I have reviewed and evaluated the relevant imaging studies.  {I have reviewed the relevant previous healthcare records.  {I obtained HPI from historian.   ED Course:  Assessment: Pt is a 26 y.o. female presents to the Emergency Department today due to RUQ pain x 1 month. Associated N/V x 3 today. States gradually worsening pain, but patient has tried to hold on as long as possible. Unable to follow up with General Surgery due to insurance. Pt states she has N/V with PO intake. No fevers. No CP/SOB. No meds PTA. Last PO intake last night due to emesis. Rates pain 8/10. Cramping sensation isolated to RUQ. On exam, pt in NAD. Nontoxic/nonseptic appearing. VSS. Afebrile. Lungs CTA. Heart RRR. Abdomen TTP RUQ. Neg Murphys. Soft abdomen. Labs unremarkable. RUQ [place] with cholelithiasis without evidence of cholycystitis. Given analgesia and Morphine in ED. PO Challenge with success. Plan is to DC home with  follow up to General Surgery. I have reviewed the [place] Controlled Substance Reporting System. Given Rx #10 Norco. . At time of discharge, Patient is in no acute distress. Vital Signs are stable. Patient is able to ambulate. Patient able to tolerate PO.    Disposition/Plan:  DC Home Additional Verbal discharge instructions given and discussed with patient.  Pt Instructed to f/u with general Surgery in the next week for evaluation and treatment of symptoms. Return precautions given Pt acknowledges and agrees with plan  Supervising Physician Mesner, Barbara Cower, MD  Final diagnoses:  RUQ pain  Calculus of gallbladder without cholecystitis without obstruction    New Prescriptions New Prescriptions   No medications on file     Audry Pili, Cordelia Poche 06/13/17 1851    Mesner, Barbara Cower, MD 06/14/17 Jacinta Shoe

## 2017-06-13 NOTE — ED Triage Notes (Signed)
Pt states she was seen here before for ? Gallstones.  C/o pain to R side and R abd x 1 month with nausea and vomiting.  Vomited x 3 today with "black specks."

## 2017-06-13 NOTE — ED Notes (Signed)
Patient given water

## 2017-09-08 ENCOUNTER — Emergency Department (HOSPITAL_COMMUNITY)
Admission: EM | Admit: 2017-09-08 | Discharge: 2017-09-08 | Disposition: A | Discharge status: Home / Self Care | Payer: Self-pay | Attending: Emergency Medicine | Admitting: Emergency Medicine

## 2017-09-08 ENCOUNTER — Encounter (HOSPITAL_COMMUNITY): Payer: Self-pay | Admitting: Emergency Medicine

## 2017-09-08 ENCOUNTER — Emergency Department (HOSPITAL_COMMUNITY): Payer: Self-pay

## 2017-09-08 DIAGNOSIS — F12188 Cannabis abuse with other cannabis-induced disorder: Secondary | ICD-10-CM | POA: Insufficient documentation

## 2017-09-08 DIAGNOSIS — R197 Diarrhea, unspecified: Secondary | ICD-10-CM | POA: Insufficient documentation

## 2017-09-08 DIAGNOSIS — R112 Nausea with vomiting, unspecified: Secondary | ICD-10-CM | POA: Insufficient documentation

## 2017-09-08 DIAGNOSIS — F12988 Cannabis use, unspecified with other cannabis-induced disorder: Secondary | ICD-10-CM

## 2017-09-08 DIAGNOSIS — F1290 Cannabis use, unspecified, uncomplicated: Secondary | ICD-10-CM

## 2017-09-08 LAB — CBC
HEMATOCRIT: 44.5 % (ref 36.0–46.0)
Hemoglobin: 15 g/dL (ref 12.0–15.0)
MCH: 32.6 pg (ref 26.0–34.0)
MCHC: 33.7 g/dL (ref 30.0–36.0)
MCV: 96.7 fL (ref 78.0–100.0)
Platelets: 232 10*3/uL (ref 150–400)
RBC: 4.6 MIL/uL (ref 3.87–5.11)
RDW: 12.4 % (ref 11.5–15.5)
WBC: 19.4 10*3/uL — AB (ref 4.0–10.5)

## 2017-09-08 LAB — COMPREHENSIVE METABOLIC PANEL
ALT: 26 U/L (ref 14–54)
ANION GAP: 10 (ref 5–15)
AST: 48 U/L — ABNORMAL HIGH (ref 15–41)
Albumin: 4.5 g/dL (ref 3.5–5.0)
Alkaline Phosphatase: 64 U/L (ref 38–126)
BILIRUBIN TOTAL: 1 mg/dL (ref 0.3–1.2)
BUN: 12 mg/dL (ref 6–20)
CHLORIDE: 104 mmol/L (ref 101–111)
CO2: 26 mmol/L (ref 22–32)
Calcium: 9.2 mg/dL (ref 8.9–10.3)
Creatinine, Ser: 0.75 mg/dL (ref 0.44–1.00)
GFR calc Af Amer: >60 mL/min (ref >60)
Glucose, Bld: 88 mg/dL (ref 65–99)
POTASSIUM: 3.8 mmol/L (ref 3.5–5.1)
SODIUM: 140 mmol/L (ref 135–145)
TOTAL PROTEIN: 7.8 g/dL (ref 6.5–8.1)

## 2017-09-08 LAB — URINALYSIS, ROUTINE W REFLEX MICROSCOPIC
Bilirubin Urine: NEGATIVE
Glucose, UA: NEGATIVE mg/dL
HGB URINE DIPSTICK: NEGATIVE
KETONES UR: NEGATIVE mg/dL
NITRITE: POSITIVE — AB
PH: 6 (ref 5.0–8.0)
Protein, ur: NEGATIVE mg/dL
SPECIFIC GRAVITY, URINE: 1.021 (ref 1.005–1.030)

## 2017-09-08 LAB — RAPID URINE DRUG SCREEN, HOSP PERFORMED
AMPHETAMINES: NOT DETECTED
Barbiturates: NOT DETECTED
Benzodiazepines: NOT DETECTED
Cocaine: NOT DETECTED
OPIATES: NOT DETECTED
TETRAHYDROCANNABINOL: POSITIVE — AB

## 2017-09-08 LAB — LIPASE, BLOOD: Lipase: 48 U/L (ref 11–51)

## 2017-09-08 LAB — POC URINE PREG, ED: Preg Test, Ur: NEGATIVE

## 2017-09-08 MED ORDER — HALOPERIDOL LACTATE 5 MG/ML IJ SOLN
2.0000 mg | Freq: Once | INTRAMUSCULAR | Status: AC
  Administered 2017-09-08: 2 mg via INTRAVENOUS
  Filled 2017-09-08: qty 1

## 2017-09-08 MED ORDER — SODIUM CHLORIDE 0.9 % IV BOLUS (SEPSIS)
1000.0000 mL | Freq: Once | INTRAVENOUS | Status: AC
  Administered 2017-09-08: 1000 mL via INTRAVENOUS

## 2017-09-08 MED ORDER — ONDANSETRON 8 MG PO TBDP: ORAL_TABLET | ORAL | 0 refills | Status: DC

## 2017-09-08 MED ORDER — DICYCLOMINE HCL 10 MG/ML IM SOLN
20.0000 mg | Freq: Once | INTRAMUSCULAR | Status: AC
  Administered 2017-09-08: 20 mg via INTRAMUSCULAR
  Filled 2017-09-08: qty 2

## 2017-09-08 MED ORDER — KETOROLAC TROMETHAMINE 30 MG/ML IJ SOLN
15.0000 mg | Freq: Once | INTRAMUSCULAR | Status: AC
  Administered 2017-09-08: 15 mg via INTRAVENOUS
  Filled 2017-09-08: qty 1

## 2017-09-08 MED ORDER — GI COCKTAIL ~~LOC~~
30.0000 mL | Freq: Once | ORAL | Status: AC
  Administered 2017-09-08: 30 mL via ORAL
  Filled 2017-09-08: qty 30

## 2017-09-08 MED ORDER — IOPAMIDOL (ISOVUE-300) INJECTION 61%
100.0000 mL | Freq: Once | INTRAVENOUS | Status: AC | PRN
  Administered 2017-09-08: 100 mL via INTRAVENOUS

## 2017-09-08 MED ORDER — IOPAMIDOL (ISOVUE-300) INJECTION 61%
INTRAVENOUS | Status: AC
  Filled 2017-09-08: qty 100

## 2017-09-08 MED ORDER — FENTANYL CITRATE (PF) 100 MCG/2ML IJ SOLN
50.0000 ug | Freq: Once | INTRAMUSCULAR | Status: AC
  Administered 2017-09-08: 50 ug via INTRAVENOUS
  Filled 2017-09-08: qty 2

## 2017-09-08 MED ORDER — OMEPRAZOLE 20 MG PO CPDR: 20.0000 mg | DELAYED_RELEASE_CAPSULE | Freq: Every day | ORAL | 0 refills | Status: DC

## 2017-09-08 NOTE — ED Triage Notes (Signed)
Pt brought in by EMS from home for c/o RUQ pain, nausea, vomiting, and diarrhea  Pt states she has had this pain off and on for the past 3-4 months   Pt was diagnosed with gallstones  Pt was [facility] at [facility] for [facility] but states they did not do anything to help her  Pt states the pain is getting worse and radiates around into her back  Pt states she has lost some weight since this started because she is unable to hold anything down Pt is crying in triage  Pt states the pain is worse when she lays down

## 2017-09-08 NOTE — ED Provider Notes (Signed)
[facility]-EMERGENCY DEPT Provider Note   CSN: [id number] Arrival date & time: 09/08/17  0409     History   Chief Complaint Chief Complaint  Patient presents with  . Abdominal Pain  . Emesis  . Diarrhea    HPI Kellie Deleon is a 26 y.o. female.  The history is provided by the patient.  Abdominal Pain   This is a recurrent problem. The current episode started 3 to 5 hours ago. The problem occurs constantly. The problem has not changed since onset.The quality of the pain is cramping. The pain is severe. Associated symptoms include diarrhea, nausea and vomiting. Pertinent negatives include fever, melena and dysuria. Nothing aggravates the symptoms. Nothing relieves the symptoms. Past workup includes ultrasound. Her past medical history does not include Crohn's disease or irritable bowel syndrome.  Emesis   The problem occurs 2 to 4 times per day. The problem has not changed since onset.The emesis has an appearance of stomach contents. There has been no fever. Associated symptoms include abdominal pain and diarrhea. Pertinent negatives include no fever.  Diarrhea   Associated symptoms include abdominal pain and vomiting. Her past medical history does not include irritable bowel syndrome.  Ate cheese fries, several donuts, cereal, candy and some other food at dinner time and woke of with n/v/d.  Has a h/o of a gall stone but has not followed up or adhered to a bland diet.    Past Medical History:  Diagnosis Date  . Anxiety   . Bacterial vaginosis    Pt stated she is prone to BV, "always comes and goes"  . Bronchitis   . Gestational diabetes 2015  . Headache(784.0)   . Loss of teeth due to extraction   . No significant past medical history   . PCOS (polycystic ovarian syndrome)   . Pregnancy induced hypertension 2015    Patient Active Problem List   Diagnosis Date Noted  . Labor and delivery, indication for care 07/24/2015  . PROM (premature rupture of  membranes) 07/24/2015  . Group B Streptococcus carrier, +RV culture, currently pregnant 07/16/2015  . Condyloma acuminata 06/27/2015  . History of pre-eclampsia in prior pregnancy, currently pregnant 02/07/2015  . Supervision of low-risk pregnancy 01/31/2015  . Mental disorders of mother, antepartum 09/06/2013    Past Surgical History:  Procedure Laterality Date  . MOUTH SURGERY    . TUBAL LIGATION Bilateral 07/25/2015   Procedure: POST PARTUM TUBAL LIGATION;  Surgeon: Levie HeritageJacob J Stinson, DO;  Location: [facility];  Service: Gynecology;  Laterality: Bilateral;    OB History    Gravida Para Term Preterm AB Living   2 2 2  0 0 2   SAB TAB Ectopic Multiple Live Births   0 0 0 0 2       Home Medications    Prior to Admission medications   Medication Sig Start Date End Date Taking? Authorizing Provider  aspirin-acetaminophen-caffeine (EXCEDRIN MIGRAINE) [phone] MG tablet Take 1 tablet by mouth every 6 (six) hours as needed for headache.   Yes [provider]    Family History Family History  Problem Relation Age of Onset  . Cancer Other   . Diabetes Maternal Grandmother   . Hypertension Maternal Grandmother   . Cancer Maternal Grandfather     Social History Social History  Substance Use Topics  . Smoking status: Current Every Day Smoker    Types: Cigarettes  . Smokeless tobacco: Never Used  . Alcohol use Yes  Comment: Occassional Use     Allergies   Latex   Review of Systems Review of Systems  Constitutional: Negative for fever.  Respiratory: Negative for shortness of breath.   Cardiovascular: Negative for chest pain, palpitations and leg swelling.  Gastrointestinal: Positive for abdominal pain, diarrhea, nausea and vomiting. Negative for melena.  Genitourinary: Negative for dysuria.  All other systems reviewed and are negative.    Physical Exam Updated Vital Signs BP 111/78 (BP Location: Right Arm)   Pulse 79   Temp 98.8 F (37.1 C) (Oral)    Resp 16   Ht 5\' 5"  (1.651 m)   Wt 75 kg (165 lb 6 oz)   SpO2 99%   BMI 27.52 kg/m   Physical Exam  Constitutional: She is oriented to person, place, and time. She appears well-developed and well-nourished. No distress.  HENT:  Head: Normocephalic and atraumatic.  Nose: Nose normal.  Mouth/Throat: No oropharyngeal exudate.  Eyes: Pupils are equal, round, and reactive to light. Conjunctivae are normal.  Neck: Normal range of motion. Neck supple. No JVD present.  Cardiovascular: Normal rate, regular rhythm, normal heart sounds and intact distal pulses.   Pulmonary/Chest: Effort normal and breath sounds normal. No stridor. She has no wheezes. She has no rales.  Abdominal: Soft. She exhibits no mass. Bowel sounds are increased. There is no hepatosplenomegaly. There is no tenderness. There is no rigidity, no rebound, no guarding, no tenderness at McBurney's point and negative Murphy's sign.  Musculoskeletal: Normal range of motion.  Lymphadenopathy:    She has no cervical adenopathy.  Neurological: She is alert and oriented to person, place, and time.  Skin: Skin is warm and dry. Capillary refill takes less than 2 seconds.  Psychiatric: She has a normal mood and affect.     ED Treatments / Results   Vitals:   09/08/17 0410 09/08/17 0418  BP:  111/78  Pulse:  79  Resp:  16  Temp:  98.8 F (37.1 C)  SpO2: 98% 99%    Labs (all labs ordered are listed, but only abnormal results are displayed)  Results for orders placed or performed during the hospital encounter of 09/08/17  Lipase, blood  Result Value Ref Range   Lipase 48 11 - 51 U/L  Comprehensive metabolic panel  Result Value Ref Range   Sodium 140 135 - 145 mmol/L   Potassium 3.8 3.5 - 5.1 mmol/L   Chloride 104 101 - 111 mmol/L   CO2 26 22 - 32 mmol/L   Glucose, Bld 88 65 - 99 mg/dL   BUN 12 6 - 20 mg/dL   Creatinine, Ser 1.61 0.44 - 1.00 mg/dL   Calcium 9.2 8.9 - 09.6 mg/dL   Total Protein 7.8 6.5 - 8.1 g/dL    Albumin 4.5 3.5 - 5.0 g/dL   AST 48 (H) 15 - 41 U/L   ALT 26 14 - 54 U/L   Alkaline Phosphatase 64 38 - 126 U/L   Total Bilirubin 1.0 0.3 - 1.2 mg/dL   GFR calc non Af Amer >60 >60 mL/min   GFR calc Af Amer >60 >60 mL/min   Anion gap 10 5 - 15  CBC  Result Value Ref Range   WBC 19.4 (H) 4.0 - 10.5 K/uL   RBC 4.60 3.87 - 5.11 MIL/uL   Hemoglobin 15.0 12.0 - 15.0 g/dL   HCT 04.5 40.9 - 81.1 %   MCV 96.7 78.0 - 100.0 fL   MCH 32.6 26.0 - 34.0 pg  MCHC 33.7 30.0 - 36.0 g/dL   RDW 16.1 09.6 - 04.5 %   Platelets 232 150 - 400 K/uL   No results found.  Radiology No results found.  Procedures Procedures (including critical care time)  Medications Ordered in ED  Medications  iopamidol (ISOVUE-300) 61 % injection (not administered)  haloperidol lactate (HALDOL) injection 2 mg (not administered)  gi cocktail (Maalox,Lidocaine,Donnatal) (not administered)  sodium chloride 0.9 % bolus 1,000 mL (1,000 mLs Intravenous New Bag/Given 09/08/17 0629)  dicyclomine (BENTYL) injection 20 mg (20 mg Intramuscular Given 09/08/17 0628)  sodium chloride 0.9 % bolus 1,000 mL (1,000 mLs Intravenous New Bag/Given 09/08/17 0629)  fentaNYL (SUBLIMAZE) injection 50 mcg (50 mcg Intravenous Given 09/08/17 0639)  iopamidol (ISOVUE-300) 61 % injection 100 mL (100 mLs Intravenous Contrast Given 09/08/17 0654)    Final Clinical Impressions(s) / ED Diagnoses  I suspect the food she ate was in response to marijuana.  I believe the patient has a viral process due to her diarrhea as well as canabanoid induced hyperemesis.  I do not believe this is biliary colic at this time. I spoke with patient about cessation of marijuana and adhering to a bland diet without family in the room.  I do recommend following up with surgery to discuss elective gall bladder removal but patient has no Murphy's sign at this time.    Strict return precautions given for shortness of breath, swelling or the lips or tongue, chest pain,  dyspnea on exertion, new weakness or numbness changes in vision or speech,  Inability to tolerate liquids or food, changes in voice cough, altered mental status or any concerns. No signs of systemic illness or infection. The patient is nontoxic-appearing on exam and vital signs are within normal limits.    I have reviewed the triage vital signs and the nursing notes. Pertinent labs &imaging results that were available during my care of the patient were reviewed by me and considered in my medical decision making (see chart for details).  After history, exam, and medical workup I feel the patient has been appropriately medically screened and is safe for discharge home. Pertinent diagnoses were discussed with the patient. Patient was given return precautions. New Prescriptions New Prescriptions   No medications on file     Pradeep Beaubrun, MD 09/08/17 [phone]

## 2017-09-08 NOTE — ED Notes (Signed)
Patient transported to CT 

## 2017-12-28 ENCOUNTER — Encounter: Payer: Self-pay | Admitting: *Deleted

## 2018-06-24 ENCOUNTER — Other Ambulatory Visit: Payer: Self-pay

## 2018-06-24 ENCOUNTER — Encounter (HOSPITAL_COMMUNITY): Payer: Self-pay | Admitting: Emergency Medicine

## 2018-06-24 ENCOUNTER — Emergency Department (HOSPITAL_COMMUNITY)
Admission: EM | Admit: 2018-06-24 | Discharge: 2018-06-24 | Disposition: A | Discharge status: Home / Self Care | Payer: Medicaid Other | Attending: Emergency Medicine | Admitting: Emergency Medicine

## 2018-06-24 DIAGNOSIS — N898 Other specified noninflammatory disorders of vagina: Secondary | ICD-10-CM | POA: Insufficient documentation

## 2018-06-24 DIAGNOSIS — F419 Anxiety disorder, unspecified: Secondary | ICD-10-CM | POA: Insufficient documentation

## 2018-06-24 DIAGNOSIS — Z202 Contact with and (suspected) exposure to infections with a predominantly sexual mode of transmission: Secondary | ICD-10-CM

## 2018-06-24 DIAGNOSIS — J020 Streptococcal pharyngitis: Secondary | ICD-10-CM | POA: Insufficient documentation

## 2018-06-24 DIAGNOSIS — F17210 Nicotine dependence, cigarettes, uncomplicated: Secondary | ICD-10-CM | POA: Insufficient documentation

## 2018-06-24 DIAGNOSIS — Z79899 Other long term (current) drug therapy: Secondary | ICD-10-CM | POA: Insufficient documentation

## 2018-06-24 DIAGNOSIS — Z91040 Latex allergy status: Secondary | ICD-10-CM | POA: Insufficient documentation

## 2018-06-24 LAB — WET PREP, GENITAL
SPERM: NONE SEEN
TRICH WET PREP: NONE SEEN
YEAST WET PREP: NONE SEEN

## 2018-06-24 LAB — URINALYSIS, ROUTINE W REFLEX MICROSCOPIC
Bacteria, UA: NONE SEEN
Bilirubin Urine: NEGATIVE
GLUCOSE, UA: 50 mg/dL — AB
Ketones, ur: NEGATIVE mg/dL
Leukocytes, UA: NEGATIVE
Nitrite: POSITIVE — AB
PH: 5 (ref 5.0–8.0)
Protein, ur: NEGATIVE mg/dL
SPECIFIC GRAVITY, URINE: 1.027 (ref 1.005–1.030)

## 2018-06-24 LAB — I-STAT BETA HCG BLOOD, ED (MC, WL, AP ONLY)

## 2018-06-24 LAB — GROUP A STREP BY PCR: Group A Strep by PCR: DETECTED — AB

## 2018-06-24 MED ORDER — CEPHALEXIN 500 MG PO CAPS: 500.0000 mg | ORAL_CAPSULE | Freq: Two times a day (BID) | ORAL | 0 refills | Status: DC

## 2018-06-24 MED ORDER — METRONIDAZOLE 500 MG PO TABS: 500.0000 mg | ORAL_TABLET | Freq: Two times a day (BID) | ORAL | 0 refills | Status: DC

## 2018-06-24 MED ORDER — CEPHALEXIN 500 MG PO CAPS: 500.0000 mg | ORAL_CAPSULE | Freq: Four times a day (QID) | ORAL | 0 refills | Status: DC

## 2018-06-24 NOTE — ED Provider Notes (Signed)
[facility] EMERGENCY DEPARTMENT Provider Note   CSN: [id number] Arrival date & time: 06/24/18  1809     History   Chief Complaint Chief Complaint  Patient presents with  . Sore Throat  . Exposure to STD    HPI Kellie Deleon is a 27 y.o. female who presents with a sore throat and request for STD check.  Past medical history significant for PCOS and recurrent bacterial vaginosis.  Past surgical history significant for tubal ligation.  The patient states that she has had a sore throat for the past 3 days.  She feels hot and cold that time.  Today she felt like she was going to pass out at work.  When another coworker passed out at work she "took this as a sign" and decided to come here. No ear pain, runny nose, cough.  Additionally she is here for an STD check.  She states that her husband has been having sex with prostitutes.  She just found this out about 3 weeks ago.  She has vaginal discharge which is unchanged.  She reports associated dysuria but this is also chronic.  She denies significant pelvic pain but has some cramping and thinks she may be starting her period.  No fever, nausea or vomiting.  HPI  Past Medical History:  Diagnosis Date  . Anxiety   . Bacterial vaginosis    Pt stated she is prone to BV, "always comes and goes"  . Bronchitis   . Gestational diabetes 2015  . Headache(784.0)   . Loss of teeth due to extraction   . No significant past medical history   . PCOS (polycystic ovarian syndrome)   . Pregnancy induced hypertension 2015    Patient Active Problem List   Diagnosis Date Noted  . Labor and delivery, indication for care 07/24/2015  . PROM (premature rupture of membranes) 07/24/2015  . Group B Streptococcus carrier, +RV culture, currently pregnant 07/16/2015  . Condyloma acuminata 06/27/2015  . History of pre-eclampsia in prior pregnancy, currently pregnant 02/07/2015  . Supervision of low-risk pregnancy 01/31/2015  . Mental  disorders of mother, antepartum 09/06/2013    Past Surgical History:  Procedure Laterality Date  . MOUTH SURGERY    . TUBAL LIGATION Bilateral 07/25/2015   Procedure: POST PARTUM TUBAL LIGATION;  Surgeon: Levie Heritage, DO;  Location: [facility];  Service: Gynecology;  Laterality: Bilateral;     OB History    Gravida  2   Para  2   Term  2   Preterm  0   AB  0   Living  2     SAB  0   TAB  0   Ectopic  0   Multiple  0   Live Births  2            Home Medications    Prior to Admission medications   Medication Sig Start Date End Date Taking? Authorizing Provider  aspirin-acetaminophen-caffeine (EXCEDRIN MIGRAINE) [phone] MG tablet Take 1 tablet by mouth every 6 (six) hours as needed for headache.    [provider]  omeprazole (PRILOSEC) 20 MG capsule Take 1 capsule (20 mg total) by mouth daily. 09/08/17   Palumbo, April, MD  ondansetron (ZOFRAN ODT) 8 MG disintegrating tablet 8mg  ODT q8 hours prn nausea 09/08/17   Palumbo, April, MD    Family History Family History  Problem Relation Age of Onset  . Cancer Other   . Diabetes Maternal Grandmother   . Hypertension  Maternal Grandmother   . Cancer Maternal Grandfather     Social History Social History   Tobacco Use  . Smoking status: Current Every Day Smoker    Types: Cigarettes  . Smokeless tobacco: Never Used  Substance Use Topics  . Alcohol use: Yes    Comment: Occassional Use  . Drug use: Yes    Types: Marijuana     Allergies   Latex   Review of Systems Review of Systems  Constitutional: Positive for chills and fever.  HENT: Positive for sore throat.   Gastrointestinal: Negative for abdominal pain, nausea and vomiting.  Genitourinary: Positive for vaginal discharge. Negative for pelvic pain.  All other systems reviewed and are negative.    Physical Exam Updated Vital Signs BP 128/81 (BP Location: Right Arm)   Pulse 83   Temp 98.9 F (37.2 C) (Oral)   Resp 18   SpO2  100%   Physical Exam  Constitutional: She is oriented to person, place, and time. She appears well-developed and well-nourished. No distress.  Calm, cooperative.  Tearful at times  HENT:  Head: Normocephalic and atraumatic.  Right Ear: Hearing, tympanic membrane, external ear and ear canal normal.  Left Ear: Hearing, tympanic membrane, external ear and ear canal normal.  Nose: Nose normal.  Mouth/Throat: Uvula is midline. Posterior oropharyngeal erythema present. Tonsillar exudate.  Eyes: Pupils are equal, round, and reactive to light. Conjunctivae are normal. Right eye exhibits no discharge. Left eye exhibits no discharge. No scleral icterus.  Neck: Normal range of motion.  Cardiovascular: Normal rate and regular rhythm.  Pulmonary/Chest: Breath sounds normal. No respiratory distress.  Abdominal: She exhibits no distension.  Genitourinary:  Genitourinary Comments: No inguinal lymphadenopathy or inguinal hernia noted. Normal external genitalia. No pain with speculum insertion. Closed cervical os with normal appearance - no rash or lesions. No significant discharge or bleeding noted from cervix or in vaginal vault. On bimanual examination no adnexal tenderness or cervical motion tenderness. Chaperone Dietitian, RN) present during exam.    Neurological: She is alert and oriented to person, place, and time.  Skin: Skin is warm and dry.  Psychiatric: She has a normal mood and affect. Her behavior is normal.  Nursing note and vitals reviewed.    ED Treatments / Results  Labs (all labs ordered are listed, but only abnormal results are displayed) Labs Reviewed  GROUP A STREP BY PCR - Abnormal; Notable for the following components:      Result Value   Group A Strep by PCR DETECTED (*)    All other components within normal limits  WET PREP, GENITAL - Abnormal; Notable for the following components:   Clue Cells Wet Prep HPF POC PRESENT (*)    WBC, Wet Prep HPF POC MANY (*)    All other  components within normal limits  URINALYSIS, ROUTINE W REFLEX MICROSCOPIC - Abnormal; Notable for the following components:   APPearance HAZY (*)    Glucose, UA 50 (*)    Hgb urine dipstick MODERATE (*)    Nitrite POSITIVE (*)    All other components within normal limits  URINE CULTURE  RPR  HIV ANTIBODY (ROUTINE TESTING)  I-STAT BETA HCG BLOOD, ED ([facility], [facility], [facility] ONLY)  I-STAT BETA HCG BLOOD, ED ([facility], [facility], [facility] ONLY)  GC/CHLAMYDIA PROBE AMP ([place]) NOT AT [facility]    EKG None  Radiology No results found.  Procedures Procedures (including critical care time)  Medications Ordered in ED Medications - No data to display  Initial Impression / Assessment and Plan / ED Course  I have reviewed the triage vital signs and the nursing notes.  Pertinent labs & imaging results that were available during my care of the patient were reviewed by me and considered in my medical decision making (see chart for details).  27 year old female here with a sore throat and request for STD check.  Her vital signs are normal.  On exam she has tonsillar exudates.  Heart is regular rate and rhythm.  Lungs are clear to auscultation her abdomen is soft and nontender.  Pelvic exam is unremarkable.  Her strep test was positive.  Radiation shows moderate hemoglobin, positive nitrates.  Is not definitively a UTI however she does report dysuria.  We will send a culture.  Her wet prep is remarkable for clue cells and many WBC.  She is not pregnant.  Gonorrhea, chlamydia, HIV and syphilis were sent.  We will treat her with Keflex for strep, possible UTI and Flagyl for BV.  She was advised that she would be called if she had any other abnormal results.  Final Clinical Impressions(s) / ED Diagnoses   Final diagnoses:  Strep pharyngitis  Possible exposure to STD    ED Discharge Orders    None       Bethel BornGekas, Kelly Marie, PA-C 06/25/18 0041    Marily MemosMesner, Jason, MD 06/25/18 [phone]

## 2018-06-24 NOTE — Discharge Instructions (Addendum)
Take Keflex twice a day for 10 days for strep throat and UTI Take flagyl twice daily for one week for BV. Do not drink alcohol while taking this medicine Please return if worsening

## 2018-06-24 NOTE — ED Triage Notes (Signed)
Pt reports she has sudden onset of sore throat.  Pt also requested an STD check as she may have been exposed.

## 2018-06-25 LAB — RPR: RPR Ser Ql: NONREACTIVE

## 2018-06-25 LAB — HIV ANTIBODY (ROUTINE TESTING W REFLEX): HIV Screen 4th Generation wRfx: NONREACTIVE

## 2018-06-26 LAB — URINE CULTURE: Culture: >=100000 — AB

## 2018-06-27 ENCOUNTER — Telehealth: Payer: Self-pay | Admitting: Emergency Medicine

## 2018-06-27 NOTE — Telephone Encounter (Signed)
Post ED Visit - Positive Culture Follow-up  Culture report reviewed by antimicrobial stewardship pharmacist:  []  Enzo BiNathan Batchelder, Pharm.D. []  Celedonio MiyamotoJeremy Frens, Pharm.D., BCPS AQ-ID []  Garvin FilaMike Maccia, Pharm.D., BCPS []  Georgina PillionElizabeth Martin, Pharm.D., BCPS []  AdamsvilleMinh Pham, [address] BoulevardPharm.D., BCPS, AAHIVP []  Estella HuskMichelle Turner, Pharm.D., BCPS, AAHIVP []  Lysle Pearlachel Rumbarger, PharmD, BCPS []  Phillips Climeshuy Dang, PharmD, BCPS []  Agapito GamesAlison Masters, PharmD, BCPS []  Verlan FriendsErin Deja, PharmD Wilhemina BonitoMelissa Love PharmD  Positive urine culture Treated with cephalexin and metronidazole, organism sensitive to the same and no further patient follow-up is required at this time.  Berle MullMiller, Trayden Brandy 06/27/2018, 11:04 AM

## 2018-08-24 ENCOUNTER — Emergency Department (HOSPITAL_COMMUNITY)
Admission: EM | Admit: 2018-08-24 | Discharge: 2018-08-24 | Disposition: A | Discharge status: Home / Self Care | Payer: Medicaid Other | Attending: Emergency Medicine | Admitting: Emergency Medicine

## 2018-08-24 ENCOUNTER — Encounter (HOSPITAL_COMMUNITY): Payer: Self-pay

## 2018-08-24 ENCOUNTER — Other Ambulatory Visit: Payer: Self-pay

## 2018-08-24 DIAGNOSIS — H00015 Hordeolum externum left lower eyelid: Secondary | ICD-10-CM | POA: Insufficient documentation

## 2018-08-24 DIAGNOSIS — F17210 Nicotine dependence, cigarettes, uncomplicated: Secondary | ICD-10-CM | POA: Insufficient documentation

## 2018-08-24 DIAGNOSIS — Z79899 Other long term (current) drug therapy: Secondary | ICD-10-CM | POA: Insufficient documentation

## 2018-08-24 MED ORDER — FLUORESCEIN SODIUM 1 MG OP STRP
1.0000 | ORAL_STRIP | Freq: Once | OPHTHALMIC | Status: AC
Start: 2018-08-24 — End: 2018-08-24
  Administered 2018-08-24: 1 via OPHTHALMIC
  Filled 2018-08-24: qty 1

## 2018-08-24 MED ORDER — OLOPATADINE HCL 0.2 % OP SOLN: 1.0000 [drp] | Freq: Three times a day (TID) | OPHTHALMIC | 1 refills | Status: DC

## 2018-08-24 MED ORDER — OLOPATADINE HCL 0.2 % OP SOLN: 1.0000 [drp] | Freq: Two times a day (BID) | OPHTHALMIC | 1 refills | Status: AC

## 2018-08-24 MED ORDER — TETRACAINE HCL 0.5 % OP SOLN
2.0000 [drp] | Freq: Once | OPHTHALMIC | Status: AC
Start: 2018-08-24 — End: 2018-08-24
  Administered 2018-08-24: 2 [drp] via OPHTHALMIC
  Filled 2018-08-24: qty 4

## 2018-08-24 NOTE — ED Provider Notes (Signed)
[facility]  CSN: [id number] Arrival date & time: 08/24/18  2117    History   Chief Complaint Chief Complaint  Patient presents with  . Eye Drainage    HPI Kellie Deleon is a 27 y.o. female with a medical history of PCOS who presented to the ED for left eye pain. She reports clear drainage and lower eyelid swelling that started this morning. Patient states that her eyelid is painful to touch. Denies vision changes, painful eye movements, eye redness, trauma or injury to the eye. Patient has not tried anything prior to arrival.    Past Medical History:  Diagnosis Date  . Anxiety   . Bacterial vaginosis    Pt stated she is prone to BV, "always comes and goes"  . Bronchitis   . Gestational diabetes 2015  . Headache(784.0)   . Loss of teeth due to extraction   . No significant past medical history   . PCOS (polycystic ovarian syndrome)   . Pregnancy induced hypertension 2015    Patient Active Problem List   Diagnosis Date Noted  . Labor and delivery, indication for care 07/24/2015  . PROM (premature rupture of membranes) 07/24/2015  . Group B Streptococcus carrier, +RV culture, currently pregnant 07/16/2015  . Condyloma acuminata 06/27/2015  . History of pre-eclampsia in prior pregnancy, currently pregnant 02/07/2015  . Supervision of low-risk pregnancy 01/31/2015  . Mental disorders of mother, antepartum 09/06/2013    Past Surgical History:  Procedure Laterality Date  . MOUTH SURGERY    . TUBAL LIGATION Bilateral 07/25/2015   Procedure: POST PARTUM TUBAL LIGATION;  Surgeon: Levie Heritage, DO;  Location: [facility];  Service: Gynecology;  Laterality: Bilateral;     OB History    Gravida  2   Para  2   Term  2   Preterm  0   AB  0   Living  2     SAB  0   TAB  0   Ectopic  0   Multiple  0   Live Births  2            Home Medications    Prior to Admission medications   Medication Sig Start Date  End Date Taking? Authorizing Provider  aspirin-acetaminophen-caffeine (EXCEDRIN MIGRAINE) [phone] MG tablet Take 1 tablet by mouth every 6 (six) hours as needed for headache.    [provider]  cephALEXin (KEFLEX) 500 MG capsule Take 1 capsule (500 mg total) by mouth 4 (four) times daily. 06/24/18   Bethel Born, PA-C  metroNIDAZOLE (FLAGYL) 500 MG tablet Take 1 tablet (500 mg total) by mouth 2 (two) times daily. 06/24/18   Bethel Born, PA-C  Olopatadine HCl (PATADAY) 0.2 % SOLN Apply 1 drop to eye 2 (two) times daily for 10 days. 08/24/18 09/03/18  Diesha Rostad, Jerrel Ivory I, PA-C  omeprazole (PRILOSEC) 20 MG capsule Take 1 capsule (20 mg total) by mouth daily. 09/08/17   Palumbo, April, MD  ondansetron (ZOFRAN ODT) 8 MG disintegrating tablet 8mg  ODT q8 hours prn nausea 09/08/17   Palumbo, April, MD    Family History Family History  Problem Relation Age of Onset  . Cancer Other   . Diabetes Maternal Grandmother   . Hypertension Maternal Grandmother   . Cancer Maternal Grandfather     Social History Social History   Tobacco Use  . Smoking status: Current Every Day Smoker    Types: Cigarettes  . Smokeless tobacco: Never Used  Substance Use Topics  . Alcohol use: Yes    Comment: Occassional Use  . Drug use: Yes    Types: Marijuana     Allergies   Latex   Review of Systems Review of Systems  Constitutional: Negative.   HENT: Negative.   Eyes: Positive for pain, discharge and itching. Negative for photophobia, redness and visual disturbance.  Skin: Negative.   Neurological: Negative.    Physical Exam Updated Vital Signs BP 116/81 (BP Location: Left Arm)   Pulse 90   Temp 98.6 F (37 C) (Oral)   Resp 15   Ht 5\' 4"  (1.626 m)   Wt 65.8 kg   SpO2 100%   BMI 24.89 kg/m   Physical Exam  Constitutional: Vital signs are normal. She appears well-developed and well-nourished. She is cooperative.  HENT:  Head: Normocephalic and atraumatic.  Eyes: Pupils  are equal, round, and reactive to light. Conjunctivae, EOM and lids are normal. Lids are everted and swept, no foreign bodies found. Right eye exhibits no discharge. Left eye exhibits hordeolum. Left eye exhibits no discharge.  Slit lamp exam:      The right eye shows no corneal abrasion, no foreign body and no hyphema.       The left eye shows no corneal abrasion, no foreign body and no hyphema.    Neurological: She is alert.  Skin: Skin is warm and intact. Capillary refill takes less than 2 seconds.  Nursing note and vitals reviewed.   ED Treatments / Results  Labs (all labs ordered are listed, but only abnormal results are displayed) Labs Reviewed - No data to display  EKG None  Radiology No results found.  Procedures Procedures (including critical care time)  Medications Ordered in ED Medications  fluorescein ophthalmic strip 1 strip (1 strip Left Eye Given 08/24/18 2253)  tetracaine (PONTOCAINE) 0.5 % ophthalmic solution 2 drop (2 drops Left Eye Given 08/24/18 2254)     Initial Impression / Assessment and Plan / ED Course  Triage vital signs and the nursing notes have been reviewed.  Pertinent labs & imaging results that were available during care of the patient were reviewed and considered in medical decision making (see chart for details).   Patient presents to the ED afebrile with complaints of left eye pain that began this morning. It is reassuring that there is no eye redness, vision changes or history of trauma. On exam, a left lower eyelid hordeolum is appreciated. No other abnormalities such as corneal abrasions, hyphema or ulcers seen with fluorescein stain. No painful EOMs to suggest preseptal cellulitis.  Final Clinical Impressions(s) / ED Diagnoses  1. Hordeolum. Rx for Pataday. Education provided on OTC and supportive treatment. Advised to follow-up with eye doctor if no resolution in 2 weeks.  Dispo: Home. After thorough clinical evaluation, this patient is  determined to be medically stable and can be safely discharged with the previously mentioned treatment and/or outpatient follow-up/referral(s). At this time, there are no other apparent medical conditions that require further screening, evaluation or treatment.   Final diagnoses:  Hordeolum externum of left lower eyelid    ED Discharge Orders         Ordered    Olopatadine HCl (PATADAY) 0.2 % SOLN  3 times daily,   Status:  Discontinued     08/24/18 2301    Olopatadine HCl (PATADAY) 0.2 % SOLN  2 times daily     08/24/18 2302  Reva Bores 08/24/18 2312    Linwood Dibbles, MD 08/24/18 [phone]

## 2018-08-24 NOTE — ED Triage Notes (Signed)
Pt reports swollen left eye with drainage since yesterday. She also endorses itching in the eye.

## 2018-08-24 NOTE — Discharge Instructions (Addendum)
You have a stye on your eye. I have prescribed you some eye drops to use. This should resolve over the next few days.   Apply a warm compress to your eye 3-4 times day for 10-15 minutes at a time. Do not wear any eye makeup while this is present as it can cause further irritation.   Please follow-up with an eye doctor if this does not resolve in 2 weeks.  Thank you for allowing [place] to take care of you today.

## 2018-09-09 ENCOUNTER — Emergency Department (HOSPITAL_COMMUNITY)
Admission: EM | Admit: 2018-09-09 | Discharge: 2018-09-09 | Disposition: A | Discharge status: Home / Self Care | Payer: Self-pay | Attending: Emergency Medicine | Admitting: Emergency Medicine

## 2018-09-09 ENCOUNTER — Encounter (HOSPITAL_COMMUNITY): Payer: Self-pay | Admitting: *Deleted

## 2018-09-09 ENCOUNTER — Emergency Department (HOSPITAL_COMMUNITY): Payer: Self-pay

## 2018-09-09 DIAGNOSIS — Y999 Unspecified external cause status: Secondary | ICD-10-CM | POA: Insufficient documentation

## 2018-09-09 DIAGNOSIS — S022XXA Fracture of nasal bones, initial encounter for closed fracture: Secondary | ICD-10-CM | POA: Insufficient documentation

## 2018-09-09 DIAGNOSIS — Y939 Activity, unspecified: Secondary | ICD-10-CM | POA: Insufficient documentation

## 2018-09-09 DIAGNOSIS — F17210 Nicotine dependence, cigarettes, uncomplicated: Secondary | ICD-10-CM | POA: Insufficient documentation

## 2018-09-09 DIAGNOSIS — Z79899 Other long term (current) drug therapy: Secondary | ICD-10-CM | POA: Insufficient documentation

## 2018-09-09 DIAGNOSIS — Y929 Unspecified place or not applicable: Secondary | ICD-10-CM | POA: Insufficient documentation

## 2018-09-09 MED ORDER — ONDANSETRON 4 MG PO TBDP
4.0000 mg | ORAL_TABLET | Freq: Once | ORAL | Status: AC
Start: 2018-09-09 — End: 2018-09-09
  Administered 2018-09-09: 4 mg via ORAL
  Filled 2018-09-09: qty 1

## 2018-09-09 MED ORDER — OXYCODONE-ACETAMINOPHEN 5-325 MG PO TABS
1.0000 | ORAL_TABLET | Freq: Once | ORAL | Status: DC
  Filled 2018-09-09: qty 1

## 2018-09-09 NOTE — ED Notes (Signed)
Patient transported to CT 

## 2018-09-09 NOTE — ED Notes (Signed)
Pt requesting to be XXX.  Pt tearful at nurse first.
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
## 2018-09-24 ENCOUNTER — Encounter (HOSPITAL_COMMUNITY): Payer: Self-pay

## 2018-09-24 ENCOUNTER — Emergency Department (HOSPITAL_COMMUNITY): Payer: Medicaid Other

## 2018-09-24 ENCOUNTER — Emergency Department (HOSPITAL_COMMUNITY)
Admission: EM | Admit: 2018-09-24 | Discharge: 2018-09-24 | Disposition: A | Discharge status: Home / Self Care | Payer: Medicaid Other | Attending: Emergency Medicine | Admitting: Emergency Medicine

## 2018-09-24 ENCOUNTER — Emergency Department (HOSPITAL_COMMUNITY)
Admission: EM | Admit: 2018-09-24 | Discharge: 2018-09-24 | Discharge status: Against Medical Advice | Payer: Medicaid Other

## 2018-09-24 DIAGNOSIS — T887XXA Unspecified adverse effect of drug or medicament, initial encounter: Secondary | ICD-10-CM | POA: Insufficient documentation

## 2018-09-24 DIAGNOSIS — R109 Unspecified abdominal pain: Secondary | ICD-10-CM | POA: Insufficient documentation

## 2018-09-24 DIAGNOSIS — Z79899 Other long term (current) drug therapy: Secondary | ICD-10-CM | POA: Insufficient documentation

## 2018-09-24 DIAGNOSIS — Y69 Unspecified misadventure during surgical and medical care: Secondary | ICD-10-CM | POA: Insufficient documentation

## 2018-09-24 DIAGNOSIS — Z91040 Latex allergy status: Secondary | ICD-10-CM | POA: Insufficient documentation

## 2018-09-24 DIAGNOSIS — R002 Palpitations: Secondary | ICD-10-CM | POA: Insufficient documentation

## 2018-09-24 DIAGNOSIS — F1210 Cannabis abuse, uncomplicated: Secondary | ICD-10-CM | POA: Insufficient documentation

## 2018-09-24 DIAGNOSIS — T50905A Adverse effect of unspecified drugs, medicaments and biological substances, initial encounter: Secondary | ICD-10-CM

## 2018-09-24 DIAGNOSIS — F17210 Nicotine dependence, cigarettes, uncomplicated: Secondary | ICD-10-CM | POA: Insufficient documentation

## 2018-09-24 LAB — COMPREHENSIVE METABOLIC PANEL
ALBUMIN: 4.6 g/dL (ref 3.5–5.0)
ALT: 14 U/L (ref 0–44)
ANION GAP: 9 (ref 5–15)
AST: 15 U/L (ref 15–41)
Alkaline Phosphatase: 45 U/L (ref 38–126)
BILIRUBIN TOTAL: 0.8 mg/dL (ref 0.3–1.2)
BUN: 9 mg/dL (ref 6–20)
CALCIUM: 9.6 mg/dL (ref 8.9–10.3)
CO2: 25 mmol/L (ref 22–32)
Chloride: 106 mmol/L (ref 98–111)
Creatinine, Ser: 0.86 mg/dL (ref 0.44–1.00)
GFR calc non Af Amer: >60 mL/min (ref >60)
Glucose, Bld: 127 mg/dL — ABNORMAL HIGH (ref 70–99)
POTASSIUM: 3.1 mmol/L — AB (ref 3.5–5.1)
SODIUM: 140 mmol/L (ref 135–145)
TOTAL PROTEIN: 7.4 g/dL (ref 6.5–8.1)

## 2018-09-24 LAB — CBC
HCT: 44.6 % (ref 36.0–46.0)
HEMOGLOBIN: 13.6 g/dL (ref 12.0–15.0)
MCH: 30.4 pg (ref 26.0–34.0)
MCHC: 30.5 g/dL (ref 30.0–36.0)
MCV: 99.6 fL (ref 80.0–100.0)
NRBC: 0 % (ref 0.0–0.2)
PLATELETS: 298 10*3/uL (ref 150–400)
RBC: 4.48 MIL/uL (ref 3.87–5.11)
RDW: 11.8 % (ref 11.5–15.5)
WBC: 10 10*3/uL (ref 4.0–10.5)

## 2018-09-24 LAB — LIPASE, BLOOD: Lipase: 37 U/L (ref 11–51)

## 2018-09-24 LAB — I-STAT BETA HCG BLOOD, ED (MC, WL, AP ONLY)

## 2018-09-24 NOTE — ED Provider Notes (Signed)
[facility] EMERGENCY DEPARTMENT Provider Note   CSN: [id number] Arrival date & time: 09/24/18  0057     History   Chief Complaint Chief Complaint  Patient presents with  . Shortness of Breath  . Drug / Alcohol Assessment    HPI Kellie Deleon is a 27 y.o. female.  27 yo F with a chief complaint of shortness of breath heart palpitations and abdominal pain.  This has resolved.  Lasted for short period of time while she was smoking marijuana.  This seems to happen every time she smokes marijuana from a certain dealer.  She is concerned that he is giving her something else and she wants to know what it is.  She denies any coingestions.  The history is provided by the patient.  Shortness of Breath  This is a recurrent problem. The average episode lasts 2 hours. The problem occurs continuously.The current episode started 1 to 2 hours ago. The problem has been resolved. Associated symptoms include abdominal pain. Pertinent negatives include no fever, no headaches, no rhinorrhea, no cough, no wheezing, no chest pain and no vomiting. She has tried nothing for the symptoms. The treatment provided no relief. She has had no prior hospitalizations. She has had prior ED visits. She has had no prior ICU admissions. Associated medical issues do not include asthma or pneumonia.  Drug / Alcohol Assessment  Pertinent negatives include no fever, no nausea and no vomiting.    Past Medical History:  Diagnosis Date  . Anxiety   . Bacterial vaginosis    Pt stated she is prone to BV, "always comes and goes"  . Bronchitis   . Gestational diabetes 2015  . Headache(784.0)   . Loss of teeth due to extraction   . No significant past medical history   . PCOS (polycystic ovarian syndrome)   . Pregnancy induced hypertension 2015    Patient Active Problem List   Diagnosis Date Noted  . Labor and delivery, indication for care 07/24/2015  . PROM (premature rupture of membranes)  07/24/2015  . Group B Streptococcus carrier, +RV culture, currently pregnant 07/16/2015  . Condyloma acuminata 06/27/2015  . History of pre-eclampsia in prior pregnancy, currently pregnant 02/07/2015  . Supervision of low-risk pregnancy 01/31/2015  . Mental disorders of mother, antepartum 09/06/2013    Past Surgical History:  Procedure Laterality Date  . MOUTH SURGERY    . TUBAL LIGATION Bilateral 07/25/2015   Procedure: POST PARTUM TUBAL LIGATION;  Surgeon: Levie Heritage, DO;  Location: [facility];  Service: Gynecology;  Laterality: Bilateral;     OB History    Gravida  2   Para  2   Term  2   Preterm  0   AB  0   Living  2     SAB  0   TAB  0   Ectopic  0   Multiple  0   Live Births  2            Home Medications    Prior to Admission medications   Medication Sig Start Date End Date Taking? Authorizing Provider  aspirin-acetaminophen-caffeine (EXCEDRIN MIGRAINE) [phone] MG tablet Take 1 tablet by mouth every 6 (six) hours as needed for headache.    [provider]  cephALEXin (KEFLEX) 500 MG capsule Take 1 capsule (500 mg total) by mouth 4 (four) times daily. 06/24/18   Bethel Born, PA-C  metroNIDAZOLE (FLAGYL) 500 MG tablet Take 1 tablet (500 mg total) by mouth  2 (two) times daily. 06/24/18   Bethel Born, PA-C  omeprazole (PRILOSEC) 20 MG capsule Take 1 capsule (20 mg total) by mouth daily. 09/08/17   Palumbo, April, MD  ondansetron (ZOFRAN ODT) 8 MG disintegrating tablet 8mg  ODT q8 hours prn nausea 09/08/17   Palumbo, April, MD    Family History Family History  Problem Relation Age of Onset  . Cancer Other   . Diabetes Maternal Grandmother   . Hypertension Maternal Grandmother   . Cancer Maternal Grandfather     Social History Social History   Tobacco Use  . Smoking status: Current Every Day Smoker    Types: Cigarettes  . Smokeless tobacco: Never Used  Substance Use Topics  . Alcohol use: Yes    Comment: Occassional Use    . Drug use: Yes    Types: Marijuana     Allergies   Latex   Review of Systems Review of Systems  Constitutional: Negative for chills and fever.  HENT: Negative for congestion and rhinorrhea.   Eyes: Negative for redness and visual disturbance.  Respiratory: Positive for shortness of breath. Negative for cough and wheezing.   Cardiovascular: Negative for chest pain and palpitations.  Gastrointestinal: Positive for abdominal pain. Negative for nausea and vomiting.  Genitourinary: Negative for dysuria and urgency.  Musculoskeletal: Negative for arthralgias and myalgias.  Skin: Negative for pallor and wound.  Neurological: Negative for dizziness and headaches.     Physical Exam Updated Vital Signs BP 112/68   Pulse 63   Temp 98.3 F (36.8 C)   Resp 19   LMP 09/07/2018 (Exact Date) Comment: pt shielded  SpO2 95%   Physical Exam  Constitutional: She is oriented to person, place, and time. She appears well-developed and well-nourished. No distress.  HENT:  Head: Normocephalic and atraumatic.  Eyes: Pupils are equal, round, and reactive to light. EOM are normal.  Neck: Normal range of motion. Neck supple.  Cardiovascular: Normal rate and regular rhythm. Exam reveals no gallop and no friction rub.  No murmur heard. Pulmonary/Chest: Effort normal. She has no wheezes. She has no rales.  Abdominal: Soft. She exhibits no distension. There is no tenderness.  Musculoskeletal: She exhibits no edema or tenderness.  Neurological: She is alert and oriented to person, place, and time.  Skin: Skin is warm and dry. She is not diaphoretic.  Psychiatric: She has a normal mood and affect. Her behavior is normal.  Nursing note and vitals reviewed.    ED Treatments / Results  Labs (all labs ordered are listed, but only abnormal results are displayed) Labs Reviewed  COMPREHENSIVE METABOLIC PANEL - Abnormal; Notable for the following components:      Result Value   Potassium 3.1 (*)     Glucose, Bld 127 (*)    All other components within normal limits  LIPASE, BLOOD  CBC  URINALYSIS, ROUTINE W REFLEX MICROSCOPIC  I-STAT BETA HCG BLOOD, ED ([facility], [facility], [facility] ONLY)    EKG EKG Interpretation  Date/Time:  Saturday September 24 2018 01:06:12 EST Ventricular Rate:  104 PR Interval:    QRS Duration: 87 QT Interval:  317 QTC Calculation: 417 R Axis:   95 Text Interpretation:  Sinus tachycardia Borderline right axis deviation Borderline T abnormalities, inferior leads No significant change since last tracing Confirmed by Melene Plan [phone]) on 09/24/2018 1:16:46 AM   Radiology Dg Chest 2 View  Result Date: 09/24/2018 CLINICAL DATA:  Shortness of breath.  Anxious. EXAM: CHEST - 2 VIEW COMPARISON:  10/01/2014  FINDINGS: The cardiac silhouette, mediastinal and hilar contours are normal. The lungs are clear. No pleural effusion or pneumothorax. The bony thorax is intact. IMPRESSION: Normal chest x-ray. Electronically Signed   By: Rudie Meyer M.D.   On: 09/24/2018 02:16    Procedures Procedures (including critical care time)  Medications Ordered in ED Medications - No data to display   Initial Impression / Assessment and Plan / ED Course  I have reviewed the triage vital signs and the nursing notes.  Pertinent labs & imaging results that were available during my care of the patient were reviewed by me and considered in my medical decision making (see chart for details).     27 yo F with a chief complaint of a reaction to smoking marijuana.  She is now back to baseline.  She is well-appearing and nontoxic has stable vital signs.  I do not feel that she needs to be observed any longer in the ED.  She had a laboratory work-up performed with negative pregnancy test LFTs and lipase are unremarkable she is a mild hypokalemia at 3.1.  Chest x-ray reviewed by me without focal infiltrate.  She has no abdominal tenderness on my exam.  Discharge home.  3:18 AM:  I have discussed the  diagnosis/risks/treatment options with the patient and believe the pt to be eligible for discharge home to follow-up with PCP. We also discussed returning to the ED immediately if new or worsening sx occur. We discussed the sx which are most concerning (e.g., sudden worsening pain, fever, inability to tolerate by mouth) that necessitate immediate return. Medications administered to the patient during their visit and any new prescriptions provided to the patient are listed below.  Medications given during this visit Medications - No data to display    The patient appears reasonably screen and/or stabilized for discharge and I doubt any other medical condition or other [facility] requiring further screening, evaluation, or treatment in the ED at this time prior to discharge.    Final Clinical Impressions(s) / ED Diagnoses   Final diagnoses:  Adverse effect of drug, initial encounter    ED Discharge Orders    None       Melene Plan, [place] 09/24/18 [phone]

## 2018-09-24 NOTE — ED Notes (Signed)
Patient Alert and oriented to baseline. Stable and ambulatory to baseline. Patient verbalized understanding of the discharge instructions.  Patient belongings were taken by the patient.   

## 2018-09-24 NOTE — ED Triage Notes (Signed)
Pt here after smoking mariajuana and stating that this feels different than normal.  Pt very anxious about the shortness of breath.  Lungs clear bilaterally.

## 2018-09-24 NOTE — ED Notes (Signed)
No answer when called for triage 

## 2018-09-24 NOTE — ED Triage Notes (Signed)
Also complaining of upper left sided abdominal pain.

## 2018-09-24 NOTE — Discharge Instructions (Signed)
Follow up with your family doc. Return for worsening symptoms.  °

## 2018-11-10 ENCOUNTER — Other Ambulatory Visit: Payer: Self-pay

## 2018-11-10 ENCOUNTER — Emergency Department (HOSPITAL_COMMUNITY)
Admission: EM | Admit: 2018-11-10 | Discharge: 2018-11-10 | Disposition: A | Discharge status: Home / Self Care | Payer: Medicaid Other | Attending: Emergency Medicine | Admitting: Emergency Medicine

## 2018-11-10 ENCOUNTER — Encounter (HOSPITAL_COMMUNITY): Payer: Self-pay

## 2018-11-10 DIAGNOSIS — N760 Acute vaginitis: Secondary | ICD-10-CM | POA: Insufficient documentation

## 2018-11-10 DIAGNOSIS — B9689 Other specified bacterial agents as the cause of diseases classified elsewhere: Secondary | ICD-10-CM | POA: Insufficient documentation

## 2018-11-10 DIAGNOSIS — Z202 Contact with and (suspected) exposure to infections with a predominantly sexual mode of transmission: Secondary | ICD-10-CM

## 2018-11-10 DIAGNOSIS — F17210 Nicotine dependence, cigarettes, uncomplicated: Secondary | ICD-10-CM | POA: Insufficient documentation

## 2018-11-10 DIAGNOSIS — Z79899 Other long term (current) drug therapy: Secondary | ICD-10-CM | POA: Insufficient documentation

## 2018-11-10 LAB — URINALYSIS, ROUTINE W REFLEX MICROSCOPIC
BILIRUBIN URINE: NEGATIVE
GLUCOSE, UA: NEGATIVE mg/dL
HGB URINE DIPSTICK: NEGATIVE
KETONES UR: NEGATIVE mg/dL
Leukocytes, UA: NEGATIVE
Nitrite: NEGATIVE
Protein, ur: NEGATIVE mg/dL
Specific Gravity, Urine: 1.014 (ref 1.005–1.030)
pH: 8 (ref 5.0–8.0)

## 2018-11-10 LAB — WET PREP, GENITAL
Sperm: NONE SEEN
Trich, Wet Prep: NONE SEEN
Yeast Wet Prep HPF POC: NONE SEEN

## 2018-11-10 LAB — I-STAT BETA HCG BLOOD, ED (MC, WL, AP ONLY)

## 2018-11-10 MED ORDER — CEFTRIAXONE SODIUM 250 MG IJ SOLR
250.0000 mg | Freq: Once | INTRAMUSCULAR | Status: AC
  Administered 2018-11-10: 250 mg via INTRAMUSCULAR
  Filled 2018-11-10: qty 250

## 2018-11-10 MED ORDER — LIDOCAINE HCL 1 % IJ SOLN
INTRAMUSCULAR | Status: AC
  Administered 2018-11-10: 0.9 mL
  Filled 2018-11-10: qty 20

## 2018-11-10 MED ORDER — AZITHROMYCIN 250 MG PO TABS
1000.0000 mg | ORAL_TABLET | Freq: Once | ORAL | Status: AC
  Administered 2018-11-10: 1000 mg via ORAL
  Filled 2018-11-10: qty 4

## 2018-11-10 MED ORDER — METRONIDAZOLE 500 MG PO TABS: 500.0000 mg | ORAL_TABLET | Freq: Two times a day (BID) | ORAL | 0 refills | Status: DC
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
## 2018-11-10 NOTE — ED Provider Notes (Signed)
[facility]-EMERGENCY DEPT Provider Note   CSN: [id number] Arrival date & time: 11/10/18  [id number]     History   Chief Complaint Chief Complaint  Patient presents with  . Exposure to STD    HPI Kellie Deleon is a 27 y.o. female.  Kellie Deleon is a 27 y.o. female with a history of anxiety, PCOS and bronchitis, who presents to the emergency department for evaluation of possible STD exposure.  She reports that she recently found out that her significant other has been having sex with others and she is very concerned for possible STD exposure.  She denies any focal symptoms, no vaginal discharge, pelvic pain, dysuria or urinary frequency.  Has not noticed any genital lesions or rashes.  No recent testing since she found out partner has been with other people.  No abdominal pain, fevers, nausea or vomiting.  No other aggravating or alleviating factors.     Past Medical History:  Diagnosis Date  . Anxiety   . Bacterial vaginosis    Pt stated she is prone to BV, "always comes and goes"  . Bronchitis   . Gestational diabetes 2015  . Headache(784.0)   . Loss of teeth due to extraction   . No significant past medical history   . PCOS (polycystic ovarian syndrome)   . Pregnancy induced hypertension 2015    Patient Active Problem List   Diagnosis Date Noted  . Labor and delivery, indication for care 07/24/2015  . PROM (premature rupture of membranes) 07/24/2015  . Group B Streptococcus carrier, +RV culture, currently pregnant 07/16/2015  . Condyloma acuminata 06/27/2015  . History of pre-eclampsia in prior pregnancy, currently pregnant 02/07/2015  . Supervision of low-risk pregnancy 01/31/2015  . Mental disorders of mother, antepartum 09/06/2013    Past Surgical History:  Procedure Laterality Date  . MOUTH SURGERY    . MULTIPLE TOOTH EXTRACTIONS    . TUBAL LIGATION Bilateral 07/25/2015   Procedure: POST PARTUM TUBAL LIGATION;  Surgeon: Levie HeritageJacob J Stinson, DO;   Location: [facility];  Service: Gynecology;  Laterality: Bilateral;     OB History    Gravida  2   Para  2   Term  2   Preterm  0   AB  0   Living  2     SAB  0   TAB  0   Ectopic  0   Multiple  0   Live Births  2            Home Medications    Prior to Admission medications   Medication Sig Start Date End Date Taking? Authorizing Provider  aspirin-acetaminophen-caffeine (EXCEDRIN MIGRAINE) [id number] MG tablet Take 1 tablet by mouth every 6 (six) hours as needed for headache.    [provider]  cephALEXin (KEFLEX) 500 MG capsule Take 1 capsule (500 mg total) by mouth 4 (four) times daily. 06/24/18   Bethel BornGekas, Kelly Marie, PA-C  metroNIDAZOLE (FLAGYL) 500 MG tablet Take 1 tablet (500 mg total) by mouth 2 (two) times daily. 06/24/18   Bethel BornGekas, Kelly Marie, PA-C  omeprazole (PRILOSEC) 20 MG capsule Take 1 capsule (20 mg total) by mouth daily. 09/08/17   Palumbo, April, MD  ondansetron (ZOFRAN ODT) 8 MG disintegrating tablet 8mg  ODT q8 hours prn nausea 09/08/17   Palumbo, April, MD    Family History Family History  Problem Relation Age of Onset  . Cancer Other   . Diabetes Maternal Grandmother   . Hypertension Maternal Grandmother   .  Cancer Maternal Grandfather     Social History Social History   Tobacco Use  . Smoking status: Current Every Day Smoker    Packs/day: 0.50    Types: Cigarettes, Cigars  . Smokeless tobacco: Never Used  Substance Use Topics  . Alcohol use: Yes    Comment: Occassional Use  . Drug use: Yes    Types: Marijuana     Allergies   Latex   Review of Systems Review of Systems  Constitutional: Negative for chills and fever.  Gastrointestinal: Negative for abdominal pain, nausea and vomiting.  Genitourinary: Negative for dysuria, frequency, genital sores, pelvic pain, vaginal bleeding, vaginal discharge and vaginal pain.  Musculoskeletal: Negative for arthralgias and myalgias.  Skin: Negative for color change and rash.  All  other systems reviewed and are negative.    Physical Exam Updated Vital Signs BP 136/85 (BP Location: Left Arm)   Pulse 90   Temp 97.7 F (36.5 C) (Oral)   Resp 16   Ht 5\' 4"  (1.626 m)   Wt 62.6 kg   SpO2 100%   BMI 23.69 kg/m   Physical Exam Vitals signs and nursing note reviewed. Exam conducted with a chaperone present.  Constitutional:      General: She is not in acute distress.    Appearance: Normal appearance. She is well-developed. She is not ill-appearing or diaphoretic.  HENT:     Head: Normocephalic and atraumatic.  Eyes:     General:        Right eye: No discharge.        Left eye: No discharge.  Neck:     Musculoskeletal: Neck supple.  Cardiovascular:     Rate and Rhythm: Normal rate and regular rhythm.     Heart sounds: Normal heart sounds.  Pulmonary:     Effort: Pulmonary effort is normal. No respiratory distress.  Abdominal:     General: Abdomen is flat. Bowel sounds are normal. There is no distension.     Palpations: Abdomen is soft. There is no mass.     Tenderness: There is no abdominal tenderness. There is no guarding.     Comments: Abdomen soft, nondistended, nontender to palpation in all quadrants without guarding or peritoneal signs  Genitourinary:    Comments: No external genital lesions noted. Speculum exam reveals moderate amount of yellowish vaginal discharge, no vaginal bleeding, cervical os is closed, bimanual exam reveals no cervical motion tenderness or focal adnexal tenderness or masses bilaterally. Skin:    General: Skin is warm and dry.  Neurological:     Mental Status: She is alert and oriented to person, place, and time. Mental status is at baseline.     Coordination: Coordination normal.  Psychiatric:        Mood and Affect: Mood normal.        Behavior: Behavior normal.      ED Treatments / Results  Labs (all labs ordered are listed, but only abnormal results are displayed) Labs Reviewed  WET PREP, GENITAL - Abnormal;  Notable for the following components:      Result Value   Clue Cells Wet Prep HPF POC PRESENT (*)    WBC, Wet Prep HPF POC MODERATE (*)    All other components within normal limits  URINALYSIS, ROUTINE W REFLEX MICROSCOPIC - Abnormal; Notable for the following components:   APPearance HAZY (*)    All other components within normal limits  RPR  HIV ANTIBODY (ROUTINE TESTING W REFLEX)  I-STAT BETA HCG BLOOD,  ED ([facility], [facility], [facility] ONLY)  GC/CHLAMYDIA PROBE AMP ([place]) NOT AT [facility]    EKG None  Radiology No results found.  Procedures Procedures (including critical care time)  Medications Ordered in ED Medications  cefTRIAXone (ROCEPHIN) injection 250 mg (250 mg Intramuscular Given 11/10/18 1011)  azithromycin (ZITHROMAX) tablet 1,000 mg (1,000 mg Oral Given 11/10/18 1011)  lidocaine (XYLOCAINE) 1 % (with pres) injection (0.9 mLs  Given 11/10/18 1026)     Initial Impression / Assessment and Plan / ED Course  I have reviewed the triage vital signs and the nursing notes.  Pertinent labs & imaging results that were available during my care of the patient were reviewed by me and considered in my medical decision making (see chart for details).  Pt presents with concerns for possible STD.  Pt understands that they have GC/Chlamydia cultures pending and that they will need to inform all sexual partners if results return positive. Pt has been treated prophylactically with azithromycin and Rocephin due to pts history, pelvic exam, and wet prep with increased WBCs.  Pt not concerning for PID because hemodynamically stable and no cervical motion tenderness on pelvic exam. Pt has also been treated with Flagyl for Bacterial Vaginosis. Pt has been advised to not drink alcohol while on this medication.  Patient to be discharged with instructions to follow up with OBGYN/PCP. Discussed importance of using protection when sexually active.    Final Clinical Impressions(s) / ED Diagnoses   Final  diagnoses:  STD exposure  Bacterial vaginosis    ED Discharge Orders         Ordered    metroNIDAZOLE (FLAGYL) 500 MG tablet  2 times daily     11/10/18 1148           Dartha Lodge, [place] 11/10/18 1149    Doug Sou, MD 11/10/18 1739

## 2018-11-10 NOTE — Discharge Instructions (Signed)
You were tested for STDs today and have testing pending you will be called in 2 to 3 days with any positive results.  You were treated today prophylactically for gonorrhea and chlamydia.  Your wet prep shows some evidence of bacterial vaginosis, please take Flagyl twice daily for the next week, do not drink alcohol while taking this medication.  In the future please follow-up with the health department for STD testing needs, if you have any positive results you will need to inform partner so they can be tested and treated as well.

## 2018-11-11 LAB — GC/CHLAMYDIA PROBE AMP (~~LOC~~) NOT AT ARMC
Chlamydia: NEGATIVE
Neisseria Gonorrhea: NEGATIVE

## 2018-11-11 LAB — HIV ANTIBODY (ROUTINE TESTING W REFLEX): HIV SCREEN 4TH GENERATION: NONREACTIVE

## 2018-11-11 LAB — RPR: RPR: NONREACTIVE

## 2018-11-21 ENCOUNTER — Emergency Department (HOSPITAL_COMMUNITY): Payer: Self-pay

## 2018-11-21 ENCOUNTER — Emergency Department (HOSPITAL_COMMUNITY)
Admission: EM | Admit: 2018-11-21 | Discharge: 2018-11-21 | Disposition: A | Discharge status: Home / Self Care | Payer: Self-pay | Attending: Emergency Medicine | Admitting: Emergency Medicine

## 2018-11-21 ENCOUNTER — Encounter (HOSPITAL_COMMUNITY): Payer: Self-pay | Admitting: Emergency Medicine

## 2018-11-21 DIAGNOSIS — Y92410 Unspecified street and highway as the place of occurrence of the external cause: Secondary | ICD-10-CM | POA: Insufficient documentation

## 2018-11-21 DIAGNOSIS — F17210 Nicotine dependence, cigarettes, uncomplicated: Secondary | ICD-10-CM | POA: Insufficient documentation

## 2018-11-21 DIAGNOSIS — Z79899 Other long term (current) drug therapy: Secondary | ICD-10-CM | POA: Insufficient documentation

## 2018-11-21 DIAGNOSIS — Y999 Unspecified external cause status: Secondary | ICD-10-CM | POA: Insufficient documentation

## 2018-11-21 DIAGNOSIS — S0990XA Unspecified injury of head, initial encounter: Secondary | ICD-10-CM | POA: Insufficient documentation

## 2018-11-21 DIAGNOSIS — Y939 Activity, unspecified: Secondary | ICD-10-CM | POA: Insufficient documentation

## 2018-11-21 LAB — POC URINE PREG, ED: PREG TEST UR: NEGATIVE

## 2018-11-21 MED ORDER — IBUPROFEN 200 MG PO TABS
600.0000 mg | ORAL_TABLET | Freq: Once | ORAL | Status: AC
  Administered 2018-11-21: 600 mg via ORAL
  Filled 2018-11-21: qty 3
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
## 2018-11-21 NOTE — Discharge Instructions (Addendum)
Please return for any problem.  Follow-up with your regular care providers as instructed.  Use ibuprofen  (600mg  every 8 hours) as instructed for pain control.
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
## 2018-12-12 ENCOUNTER — Other Ambulatory Visit: Payer: Self-pay

## 2018-12-12 ENCOUNTER — Emergency Department (HOSPITAL_BASED_OUTPATIENT_CLINIC_OR_DEPARTMENT_OTHER): Payer: Medicaid Other

## 2018-12-12 ENCOUNTER — Emergency Department (HOSPITAL_BASED_OUTPATIENT_CLINIC_OR_DEPARTMENT_OTHER)
Admission: EM | Admit: 2018-12-12 | Discharge: 2018-12-12 | Disposition: A | Discharge status: Home / Self Care | Payer: Medicaid Other | Attending: Emergency Medicine | Admitting: Emergency Medicine

## 2018-12-12 ENCOUNTER — Encounter (HOSPITAL_BASED_OUTPATIENT_CLINIC_OR_DEPARTMENT_OTHER): Payer: Self-pay | Admitting: *Deleted

## 2018-12-12 DIAGNOSIS — J029 Acute pharyngitis, unspecified: Secondary | ICD-10-CM

## 2018-12-12 DIAGNOSIS — Z91040 Latex allergy status: Secondary | ICD-10-CM | POA: Insufficient documentation

## 2018-12-12 DIAGNOSIS — F17210 Nicotine dependence, cigarettes, uncomplicated: Secondary | ICD-10-CM | POA: Insufficient documentation

## 2018-12-12 DIAGNOSIS — N898 Other specified noninflammatory disorders of vagina: Secondary | ICD-10-CM | POA: Insufficient documentation

## 2018-12-12 DIAGNOSIS — Z79899 Other long term (current) drug therapy: Secondary | ICD-10-CM | POA: Insufficient documentation

## 2018-12-12 DIAGNOSIS — B9789 Other viral agents as the cause of diseases classified elsewhere: Secondary | ICD-10-CM

## 2018-12-12 DIAGNOSIS — J069 Acute upper respiratory infection, unspecified: Secondary | ICD-10-CM

## 2018-12-12 LAB — URINALYSIS, ROUTINE W REFLEX MICROSCOPIC
Bilirubin Urine: NEGATIVE
Glucose, UA: NEGATIVE mg/dL
Hgb urine dipstick: NEGATIVE
Ketones, ur: 15 mg/dL — AB
Leukocytes, UA: NEGATIVE
Nitrite: NEGATIVE
Protein, ur: NEGATIVE mg/dL
Specific Gravity, Urine: 1.02 (ref 1.005–1.030)
pH: 6.5 (ref 5.0–8.0)

## 2018-12-12 LAB — GROUP A STREP BY PCR: Group A Strep by PCR: NOT DETECTED

## 2018-12-12 LAB — PREGNANCY, URINE: Preg Test, Ur: NEGATIVE

## 2018-12-12 LAB — WET PREP, GENITAL
Clue Cells Wet Prep HPF POC: NONE SEEN
Sperm: NONE SEEN
Trich, Wet Prep: NONE SEEN
Yeast Wet Prep HPF POC: NONE SEEN

## 2018-12-12 MED ORDER — CEFTRIAXONE SODIUM 250 MG IJ SOLR
250.0000 mg | Freq: Once | INTRAMUSCULAR | Status: AC
  Administered 2018-12-12: 250 mg via INTRAMUSCULAR
  Filled 2018-12-12: qty 250

## 2018-12-12 MED ORDER — DOXYCYCLINE HYCLATE 100 MG PO CAPS: 100.0000 mg | ORAL_CAPSULE | Freq: Two times a day (BID) | ORAL | 0 refills | Status: DC

## 2018-12-12 MED ORDER — METRONIDAZOLE 500 MG PO TABS: 500.0000 mg | ORAL_TABLET | Freq: Two times a day (BID) | ORAL | 0 refills | Status: DC

## 2018-12-12 MED ORDER — LIDOCAINE HCL (PF) 1 % IJ SOLN
INTRAMUSCULAR | Status: AC
  Administered 2018-12-12: 0.9 mL
  Filled 2018-12-12: qty 5

## 2018-12-12 NOTE — ED Provider Notes (Signed)
[facility] EMERGENCY DEPARTMENT Provider Note   CSN: [id number] Arrival date & time: 12/12/18  1111     History   Chief Complaint Chief Complaint  Patient presents with  . Sore Throat  . Cough    HPI Kellie Deleon is a 28 y.o. female with history of PCOS who presents with sore throat and cough for the past week as well as vaginal discharge and irritation for the past several weeks.  Patient reports she was treated for BV, however she never took the medication.  She is also had some urinary irritation related to this as well as some suprapubic pain.  Patient reports her children were diagnosed with a respiratory viral illness.  She reports that she has had a cough and sore throat intermittently for the past year.  She also reports intermittent night sweats and fever for the past year.  She reports she has lost 75 pounds over the course of the past year, unintentionally.  She reports she has been to the emergency department for other problems several times and one time was found to have gallstones which was reportedly explained to cause her symptoms, however she did not have any abdominal pain at this time.  She denies any shortness of breath.  She has had some chest pain only with cough.  She is currently going through a stressful divorce.  She does not have a PCP or medical insurance.  HPI  Past Medical History:  Diagnosis Date  . Anxiety   . Bacterial vaginosis    Pt stated she is prone to BV, "always comes and goes"  . Bronchitis   . Gestational diabetes 2015  . Headache(784.0)   . Loss of teeth due to extraction   . No significant past medical history   . PCOS (polycystic ovarian syndrome)   . Pregnancy induced hypertension 2015    Patient Active Problem List   Diagnosis Date Noted  . Labor and delivery, indication for care 07/24/2015  . PROM (premature rupture of membranes) 07/24/2015  . Group B Streptococcus carrier, +RV culture, currently pregnant 07/16/2015    . Condyloma acuminata 06/27/2015  . History of pre-eclampsia in prior pregnancy, currently pregnant 02/07/2015  . Supervision of low-risk pregnancy 01/31/2015  . Mental disorders of mother, antepartum 09/06/2013    Past Surgical History:  Procedure Laterality Date  . MOUTH SURGERY    . MULTIPLE TOOTH EXTRACTIONS    . TUBAL LIGATION Bilateral 07/25/2015   Procedure: POST PARTUM TUBAL LIGATION;  Surgeon: Levie Heritage, DO;  Location: [facility];  Service: Gynecology;  Laterality: Bilateral;     OB History    Gravida  2   Para  2   Term  2   Preterm  0   AB  0   Living  2     SAB  0   TAB  0   Ectopic  0   Multiple  0   Live Births  2            Home Medications    Prior to Admission medications   Medication Sig Start Date End Date Taking? Authorizing Provider  calcium carbonate (TUMS - DOSED IN MG ELEMENTAL CALCIUM) 500 MG chewable tablet Chew 1 tablet by mouth daily as needed for indigestion or heartburn.    [provider]  cephALEXin (KEFLEX) 500 MG capsule Take 1 capsule (500 mg total) by mouth 4 (four) times daily. Patient not taking: Reported on 11/10/2018 06/24/18   Jefm Bryant,  Jory Sims, PA-C  doxycycline (VIBRAMYCIN) 100 MG capsule Take 1 capsule (100 mg total) by mouth 2 (two) times daily for 14 days. 12/12/18 12/26/18  Kaydin Labo, Waylan Boga, PA-C  Emollient (NIVEA) cream Apply 1 application topically as needed for dry skin.    [provider]  metroNIDAZOLE (FLAGYL) 500 MG tablet Take 1 tablet (500 mg total) by mouth 2 (two) times daily. 12/12/18   Emi Holes, PA-C  Multiple Vitamin (MULTIVITAMIN WITH MINERALS) TABS tablet Take 1 tablet by mouth daily.    [provider]  omeprazole (PRILOSEC) 20 MG capsule Take 1 capsule (20 mg total) by mouth daily. Patient not taking: Reported on 11/10/2018 09/08/17   Palumbo, April, MD  ondansetron [facility] ODT) 8 MG disintegrating tablet 8mg  ODT q8 hours prn nausea Patient not taking: Reported  on 11/10/2018 09/08/17   Nicanor Alcon, April, MD    Family History Family History  Problem Relation Age of Onset  . Cancer Other   . Diabetes Maternal Grandmother   . Hypertension Maternal Grandmother   . Cancer Maternal Grandfather     Social History Social History   Tobacco Use  . Smoking status: Current Every Day Smoker    Packs/day: 0.50    Types: Cigarettes, Cigars  . Smokeless tobacco: Never Used  Substance Use Topics  . Alcohol use: Yes    Comment: Occassional Use  . Drug use: Yes    Types: Marijuana     Allergies   Latex   Review of Systems Review of Systems  Constitutional: Positive for appetite change, chills, fever and unexpected weight change.  HENT: Positive for sore throat. Negative for facial swelling.   Respiratory: Positive for cough. Negative for shortness of breath.   Cardiovascular: Positive for chest pain (with cough).  Gastrointestinal: Negative for abdominal pain, nausea and vomiting.  Genitourinary: Positive for dysuria and vaginal discharge.  Musculoskeletal: Negative for back pain.  Skin: Negative for rash and wound.  Neurological: Negative for headaches.  Psychiatric/Behavioral: The patient is not nervous/anxious.      Physical Exam Updated Vital Signs BP 102/67 (BP Location: Right Arm)   Pulse 60   Temp 99.7 F (37.6 C) (Oral)   Resp 18   Ht 5\' 4"  (1.626 m)   Wt 62 kg   LMP 12/10/2018   SpO2 100%   BMI 23.46 kg/m   Physical Exam Vitals signs and nursing note reviewed. Exam conducted with a chaperone present.  Constitutional:      General: She is not in acute distress.    Appearance: She is well-developed. She is not diaphoretic.  HENT:     Head: Normocephalic and atraumatic.     Right Ear: Tympanic membrane normal.     Left Ear: Tympanic membrane normal.     Mouth/Throat:     Pharynx: Posterior oropharyngeal erythema present. No oropharyngeal exudate.  Eyes:     General: No scleral icterus.       Right eye: No discharge.         Left eye: No discharge.     Conjunctiva/sclera: Conjunctivae normal.     Pupils: Pupils are equal, round, and reactive to light.  Neck:     Musculoskeletal: Normal range of motion and neck supple.     Thyroid: No thyromegaly.  Cardiovascular:     Rate and Rhythm: Normal rate and regular rhythm.     Heart sounds: Normal heart sounds. No murmur. No friction rub. No gallop.   Pulmonary:     Effort:  Pulmonary effort is normal. No respiratory distress.     Breath sounds: Normal breath sounds. No stridor. No wheezing or rales.  Abdominal:     General: Bowel sounds are normal. There is no distension.     Palpations: Abdomen is soft.     Tenderness: There is no abdominal tenderness. There is no guarding or rebound.  Genitourinary:    Cervix: Cervical motion tenderness, discharge (clear/yellow) and erythema present.     Adnexa:        Right: No tenderness.         Left: No tenderness.    Lymphadenopathy:     Cervical: No cervical adenopathy.  Skin:    General: Skin is warm and dry.     Coloration: Skin is not pale.     Findings: No rash.  Neurological:     Mental Status: She is alert.     Coordination: Coordination normal.      ED Treatments / Results  Labs (all labs ordered are listed, but only abnormal results are displayed) Labs Reviewed  WET PREP, GENITAL - Abnormal; Notable for the following components:      Result Value   WBC, Wet Prep HPF POC MANY (*)    All other components within normal limits  URINALYSIS, ROUTINE W REFLEX MICROSCOPIC - Abnormal; Notable for the following components:   Ketones, ur 15 (*)    All other components within normal limits  GROUP A STREP BY PCR  PREGNANCY, URINE  GC/CHLAMYDIA PROBE AMP ([place]) NOT [facility]    EKG None  Radiology Dg Chest 2 View  Result Date: 12/12/2018 CLINICAL DATA:  Cough and congestion for 6 months. EXAM: CHEST - 2 VIEW COMPARISON:  11/21/2018 FINDINGS: The cardiac silhouette, mediastinal and hilar  contours are within normal limits and stable. The lungs are clear. The bony thorax is intact. IMPRESSION: No acute cardiopulmonary findings. Electronically Signed   By: Rudie MeyerP.  Gallerani M.D.   On: 12/12/2018 12:36    Procedures Procedures (including critical care time)  Medications Ordered in ED Medications  cefTRIAXone (ROCEPHIN) injection 250 mg (250 mg Intramuscular Given 12/12/18 1444)  lidocaine (PF) (XYLOCAINE) 1 % injection (0.9 mLs  Given 12/12/18 1444)     Initial Impression / Assessment and Plan / ED Course  I have reviewed the triage vital signs and the nursing notes.  Pertinent labs & imaging results that were available during my care of the patient were reviewed by me and considered in my medical decision making (see chart for details).     Patient presenting with multiple complaints.  She reports initially for sore throat and cough, however states this has been intermittent for the past year.  In conjunction, she has had intermittent fevers, night sweats, and has lost 75 pounds in the past year.  She has had a vaginal discharge and intermittent pelvic pain.  She had CMT on exam.  Will cover for PID with Rocephin and doxycycline and also Flagyl considering patient did not take the Flagyl last time was positive for BV.  Wet prep today is negative however patient does report continued discharge.  UA is negative except for 15 ketones.  Suspect mild dehydration as cause.  Urine pregnancy negative.  Chest x-ray is clear.  Patient stressed to follow-up with OB/GYN as her cervix was erythematous and she has not had a Pap in a while.  Patient also stressed to follow-up with a PCP considering her B symptoms.  Patient had labs in conjunction with  the symptoms in November, CBC and CMP, and HIV which were negative except for mild hypokalemia.  Her symptoms have not changed since then and will not repeat today, but patient referred to PCP for further work-up.  Return precautions discussed.  Patient  understands and agrees with plan.  Patient vital stable throughout ED course and discharged in satisfactory condition. I discussed patient case with Dr. Fredderick PhenixBelfi who guided the patient's management and agrees with plan.   Final Clinical Impressions(s) / ED Diagnoses   Final diagnoses:  Viral URI with cough  Sore throat  Vaginal discharge    ED Discharge Orders         Ordered    metroNIDAZOLE (FLAGYL) 500 MG tablet  2 times daily     12/12/18 1435    doxycycline (VIBRAMYCIN) 100 MG capsule  2 times daily     12/12/18 [address]1435           Bow Buntyn, IgnacioAlexandra M, [place]PA-C 12/12/18 1529    Rolan BuccoBelfi, Melanie, MD 12/16/18 1455

## 2018-12-12 NOTE — Discharge Instructions (Signed)
Take doxycycline and Flagyl until completed to treat for possible pelvic inflammatory disease.  Do not drink alcohol within 24 hours of taking Flagyl.  Please establish care with a primary care provider for further evaluation and treatment of your ongoing symptoms for the past year.  Please follow-up and establish care with a OB/GYN for Pap smear and annual exam.  Please return to the emergency department if you develop any new or worsening symptoms.

## 2018-12-12 NOTE — ED Triage Notes (Signed)
Sore throat and cough for "a long time". Her kids are sick and were diagnosed with virus today.

## 2018-12-13 LAB — GC/CHLAMYDIA PROBE AMP (~~LOC~~) NOT AT ARMC
Chlamydia: NEGATIVE
Neisseria Gonorrhea: NEGATIVE

## 2018-12-14 ENCOUNTER — Encounter: Payer: Self-pay | Admitting: Family Medicine

## 2018-12-14 ENCOUNTER — Ambulatory Visit (INDEPENDENT_AMBULATORY_CARE_PROVIDER_SITE_OTHER): Payer: Self-pay | Admitting: Family Medicine

## 2018-12-14 VITALS — BP 116/70 | HR 70 | Temp 98.2°F | Ht 64.0 in | Wt 130.0 lb

## 2018-12-14 DIAGNOSIS — Z0000 Encounter for general adult medical examination without abnormal findings: Secondary | ICD-10-CM

## 2018-12-14 DIAGNOSIS — Z131 Encounter for screening for diabetes mellitus: Secondary | ICD-10-CM

## 2018-12-14 DIAGNOSIS — Z7689 Persons encountering health services in other specified circumstances: Secondary | ICD-10-CM

## 2018-12-14 DIAGNOSIS — Z09 Encounter for follow-up examination after completed treatment for conditions other than malignant neoplasm: Secondary | ICD-10-CM

## 2018-12-14 DIAGNOSIS — R1011 Right upper quadrant pain: Secondary | ICD-10-CM | POA: Insufficient documentation

## 2018-12-14 DIAGNOSIS — R110 Nausea: Secondary | ICD-10-CM

## 2018-12-14 DIAGNOSIS — R8290 Unspecified abnormal findings in urine: Secondary | ICD-10-CM

## 2018-12-14 DIAGNOSIS — R1030 Lower abdominal pain, unspecified: Secondary | ICD-10-CM

## 2018-12-14 DIAGNOSIS — B999 Unspecified infectious disease: Secondary | ICD-10-CM | POA: Insufficient documentation

## 2018-12-14 LAB — POCT URINALYSIS DIP (MANUAL ENTRY)
Glucose, UA: NEGATIVE mg/dL
Leukocytes, UA: NEGATIVE
Nitrite, UA: NEGATIVE
Protein Ur, POC: 30 mg/dL — AB
Spec Grav, UA: 1.02 (ref 1.010–1.025)
Urobilinogen, UA: 0.2 E.U./dL
pH, UA: 7 (ref 5.0–8.0)

## 2018-12-14 LAB — POCT GLYCOSYLATED HEMOGLOBIN (HGB A1C): Hemoglobin A1C: 4.6 % (ref 4.0–5.6)

## 2018-12-14 MED ORDER — METRONIDAZOLE 500 MG PO TABS: 500.0000 mg | ORAL_TABLET | Freq: Two times a day (BID) | ORAL | 0 refills | Status: DC

## 2018-12-14 MED ORDER — DOXYCYCLINE HYCLATE 100 MG PO CAPS: 100.0000 mg | ORAL_CAPSULE | Freq: Two times a day (BID) | ORAL | 0 refills | Status: AC

## 2018-12-14 MED ORDER — ONDANSETRON HCL 4 MG PO TABS: 4.0000 mg | ORAL_TABLET | Freq: Three times a day (TID) | ORAL | 0 refills | Status: DC | PRN

## 2018-12-14 NOTE — Patient Instructions (Signed)
Ondansetron tablets What is this medicine? ONDANSETRON (on DAN se tron) is used to treat nausea and vomiting caused by chemotherapy. It is also used to prevent or treat nausea and vomiting after surgery. This medicine may be used for other purposes; ask your health care provider or pharmacist if you have questions. COMMON BRAND NAME(S): Zofran What should I tell my health care provider before I take this medicine? They need to know if you have any of these conditions: -heart disease -history of irregular heartbeat -liver disease -low levels of magnesium or potassium in the blood -an unusual or allergic reaction to ondansetron, granisetron, other medicines, foods, dyes, or preservatives -pregnant or trying to get pregnant -breast-feeding How should I use this medicine? Take this medicine by mouth with a glass of water. Follow the directions on your prescription label. Take your doses at regular intervals. Do not take your medicine more often than directed. Talk to your pediatrician regarding the use of this medicine in children. Special care may be needed. Overdosage: If you think you have taken too much of this medicine contact a poison control center or emergency room at once. NOTE: This medicine is only for you. Do not share this medicine with others. What if I miss a dose? If you miss a dose, take it as soon as you can. If it is almost time for your next dose, take only that dose. Do not take double or extra doses. What may interact with this medicine? Do not take this medicine with any of the following medications: -apomorphine -certain medicines for fungal infections like fluconazole, itraconazole, ketoconazole, posaconazole, voriconazole -cisapride -dofetilide -dronedarone -pimozide -thioridazine -ziprasidone This medicine may also interact with the following medications: -carbamazepine -certain medicines for depression, anxiety, or psychotic  disturbances -fentanyl -linezolid -MAOIs like Carbex, Eldepryl, Marplan, Nardil, and Parnate -methylene blue (injected into a vein) -other medicines that prolong the QT interval (cause an abnormal heart rhythm) -phenytoin -rifampicin -tramadol This list may not describe all possible interactions. Give your health care provider a list of all the medicines, herbs, non-prescription drugs, or dietary supplements you use. Also tell them if you smoke, drink alcohol, or use illegal drugs. Some items may interact with your medicine. What should I watch for while using this medicine? Check with your doctor or health care professional right away if you have any sign of an allergic reaction. What side effects may I notice from receiving this medicine? Side effects that you should report to your doctor or health care professional as soon as possible: -allergic reactions like skin rash, itching or hives, swelling of the face, lips or tongue -breathing problems -confusion -dizziness -fast or irregular heartbeat -feeling faint or lightheaded, falls -fever and chills -loss of balance or coordination -seizures -sweating -swelling of the hands or feet -tightness in the chest -tremors -unusually weak or tired Side effects that usually do not require medical attention (report to your doctor or health care professional if they continue or are bothersome): -constipation or diarrhea -headache This list may not describe all possible side effects. Call your doctor for medical advice about side effects. You may report side effects to FDA at 1-800-FDA-1088. Where should I keep my medicine? Keep out of the reach of children. Store between 2 and 30 degrees C (36 and 86 degrees F). Throw away any unused medicine after the expiration date. NOTE: This sheet is a summary. It may not cover all possible information. If you have questions about this medicine, talk to your doctor, pharmacist,  or health care  provider.  2019 Elsevier/Gold Standard (2013-08-09 16:27:45) Nausea, Adult Nausea is feeling sick to your stomach or feeling that you are about to throw up (vomit). Feeling sick to your stomach is usually not serious, but it may be an early sign of a more serious medical problem. As you feel sicker to your stomach, you may throw up. If you throw up, or if you are not able to drink enough fluids, there is a risk that you may lose too much water in your body (get dehydrated). If you lose too much water in your body, you may:  Feel tired.  Feel thirsty.  Have a dry mouth.  Have cracked lips.  Go pee (urinate) less often. Older adults and people who have other diseases or a weak body defense system (immune system) have a higher risk of losing too much water in the body. The main goals of treating this condition are:  To relieve your nausea.  To ensure your nausea occurs less often.  To prevent throwing up and losing too much fluid. Follow these instructions at home: Watch your symptoms for any changes. Tell your doctor about them. Follow these instructions as told by your doctor. Eating and drinking      Take an ORS (oral rehydration solution). This is a drink that is sold at pharmacies and stores.  Drink clear fluids in small amounts as you are able. These include: ? Water. ? Ice chips. ? Fruit juice that has water added (diluted fruit juice). ? Low-calorie sports drinks.  Eat bland, easy-to-digest foods in small amounts as you are able, such as: ? Bananas. ? Applesauce. ? Rice. ? Low-fat (lean) meats. ? Toast. ? Crackers.  Avoid drinking fluids that have a lot of sugar or caffeine in them. This includes energy drinks, sports drinks, and soda.  Avoid alcohol.  Avoid spicy or fatty foods. General instructions  Take over-the-counter and prescription medicines only as told by your doctor.  Rest at home while you get better.  Drink enough fluid to keep your pee  (urine) pale yellow.  Take slow and deep breaths when you feel sick to your stomach.  Avoid food or things that have strong smells.  Wash your hands often with soap and water. If you cannot use soap and water, use hand sanitizer.  Make sure that all people in your home wash their hands well and often.  Keep all follow-up visits as told by your doctor. This is important. Contact a doctor if:  You feel sicker to your stomach.  You feel sick to your stomach for more than 2 days.  You throw up.  You are not able to drink fluids without throwing up.  You have new symptoms.  You have a fever.  You have a headache.  You have muscle cramps.  You have a rash.  You have pain while peeing.  You feel light-headed or dizzy. Get help right away if:  You have pain in your chest, neck, arm, or jaw.  You feel very weak or you pass out (faint).  You have throw up that is bright red or looks like coffee grounds.  You have bloody or black poop (stools) or poop that looks like tar.  You have a very bad headache, a stiff neck, or both.  You have very bad pain, cramping, or bloating in your belly (abdomen).  You have trouble breathing or you are breathing very quickly.  Your heart is beating very quickly.  Your skin feels cold and clammy.  You feel confused.  You have signs of losing too much water in your body, such as: ? Dark pee, very little pee, or no pee. ? Cracked lips. ? Dry mouth. ? Sunken eyes. ? Sleepiness. ? Weakness. These symptoms may be an emergency. Do not wait to see if the symptoms will go away. Get medical help right away. Call your local emergency services (911 in the [place] Do not drive yourself to the hospital. Summary  Nausea is feeling sick to your stomach or feeling that you are about to throw up (vomit).  If you throw up, or if you are not able to drink enough fluids, there is a risk that you may lose too much water in your body (get  dehydrated).  Eat and drink what your doctor tells you. Take over-the-counter and prescription medicines only as told by your doctor.  Contact a doctor right away if your symptoms get worse or you have new symptoms.  Keep all follow-up visits as told by your doctor. This is important. This information is not intended to replace advice given to you by your health care provider. Make sure you discuss any questions you have with your health care provider. Document Released: 10/22/2011 Document Revised: 04/12/2018 Document Reviewed: 04/12/2018 Elsevier Interactive Patient Education  2019 [employer]
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
## 2018-12-15 LAB — COMPREHENSIVE METABOLIC PANEL
ALT: 39 IU/L — ABNORMAL HIGH (ref 0–32)
AST: 21 IU/L (ref 0–40)
Albumin/Globulin Ratio: 2 (ref 1.2–2.2)
Albumin: 4.9 g/dL (ref 3.9–5.0)
Alkaline Phosphatase: 70 IU/L (ref 39–117)
BUN/Creatinine Ratio: 11 (ref 9–23)
BUN: 8 mg/dL (ref 6–20)
Bilirubin Total: 0.5 mg/dL (ref 0.0–1.2)
CO2: 24 mmol/L (ref 20–29)
Calcium: 9.4 mg/dL (ref 8.7–10.2)
Chloride: 102 mmol/L (ref 96–106)
Creatinine, Ser: 0.72 mg/dL (ref 0.57–1.00)
GFR calc Af Amer: 132 mL/min/{1.73_m2} (ref >59)
GFR calc non Af Amer: 114 mL/min/{1.73_m2} (ref >59)
Globulin, Total: 2.5 g/dL (ref 1.5–4.5)
Glucose: 91 mg/dL (ref 65–99)
Potassium: 4.3 mmol/L (ref 3.5–5.2)
Sodium: 143 mmol/L (ref 134–144)
Total Protein: 7.4 g/dL (ref 6.0–8.5)

## 2018-12-15 LAB — CBC WITH DIFFERENTIAL/PLATELET
Basophils Absolute: 0 10*3/uL (ref 0.0–0.2)
Basos: 1 %
EOS (ABSOLUTE): 0 10*3/uL (ref 0.0–0.4)
Eos: 1 %
Hematocrit: 40.4 % (ref 34.0–46.6)
Hemoglobin: 14.1 g/dL (ref 11.1–15.9)
Immature Grans (Abs): 0 10*3/uL (ref 0.0–0.1)
Immature Granulocytes: 0 %
Lymphocytes Absolute: 1.6 10*3/uL (ref 0.7–3.1)
Lymphs: 38 %
MCH: 32.1 pg (ref 26.6–33.0)
MCHC: 34.9 g/dL (ref 31.5–35.7)
MCV: 92 fL (ref 79–97)
Monocytes Absolute: 0.5 10*3/uL (ref 0.1–0.9)
Monocytes: 12 %
Neutrophils Absolute: 2 10*3/uL (ref 1.4–7.0)
Neutrophils: 48 %
Platelets: 254 10*3/uL (ref 150–450)
RBC: 4.39 x10E6/uL (ref 3.77–5.28)
RDW: 11.6 % — ABNORMAL LOW (ref 11.7–15.4)
WBC: 4.2 10*3/uL (ref 3.4–10.8)

## 2018-12-15 LAB — TSH: TSH: 0.459 u[IU]/mL (ref 0.450–4.500)

## 2018-12-15 MED FILL — metroNIDAZOLE 500 MG TABS: 500 | 7 days supply | Qty: 14 | Fill #0

## 2018-12-15 MED FILL — ONDANSETRON HCL 4 MG TABLET: 4 | 6 days supply | Qty: 20 | Fill #0

## 2018-12-15 MED FILL — DOXYCYCLINE HYCLATE 100 MG: 100 | 14 days supply | Qty: 28 | Fill #0

## 2018-12-16 LAB — URINE CULTURE: Organism ID, Bacteria: NO GROWTH

## 2018-12-23 ENCOUNTER — Ambulatory Visit (INDEPENDENT_AMBULATORY_CARE_PROVIDER_SITE_OTHER): Payer: Medicaid Other | Admitting: Family Medicine

## 2018-12-23 ENCOUNTER — Encounter: Payer: Self-pay | Admitting: Family Medicine

## 2018-12-23 VITALS — BP 104/76 | HR 94 | Temp 97.8°F | Ht 64.0 in | Wt 128.0 lb

## 2018-12-23 DIAGNOSIS — R630 Anorexia: Secondary | ICD-10-CM

## 2018-12-23 DIAGNOSIS — Z0000 Encounter for general adult medical examination without abnormal findings: Secondary | ICD-10-CM | POA: Diagnosis not present

## 2018-12-23 DIAGNOSIS — Z23 Encounter for immunization: Secondary | ICD-10-CM | POA: Diagnosis not present

## 2018-12-23 DIAGNOSIS — R1084 Generalized abdominal pain: Secondary | ICD-10-CM

## 2018-12-23 DIAGNOSIS — Z09 Encounter for follow-up examination after completed treatment for conditions other than malignant neoplasm: Secondary | ICD-10-CM

## 2018-12-23 LAB — POCT URINALYSIS DIP (MANUAL ENTRY)
Bilirubin, UA: NEGATIVE
Blood, UA: NEGATIVE
Glucose, UA: NEGATIVE mg/dL
Ketones, POC UA: NEGATIVE mg/dL
Leukocytes, UA: NEGATIVE
Nitrite, UA: NEGATIVE
Spec Grav, UA: 1.025 (ref 1.010–1.025)
Urobilinogen, UA: 0.2 E.U./dL
pH, UA: 6.5 (ref 5.0–8.0)

## 2018-12-23 MED ORDER — ALBUTEROL SULFATE HFA 108 (90 BASE) MCG/ACT IN AERS: 2.0000 | INHALATION_SPRAY | Freq: Four times a day (QID) | RESPIRATORY_TRACT | 8 refills | Status: DC | PRN

## 2018-12-23 NOTE — Patient Instructions (Signed)

## 2018-12-23 NOTE — Progress Notes (Signed)
poct
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
## 2018-12-26 ENCOUNTER — Emergency Department (HOSPITAL_COMMUNITY)
Admission: EM | Admit: 2018-12-26 | Discharge: 2018-12-26 | Disposition: A | Discharge status: Home / Self Care | Payer: Self-pay | Attending: Emergency Medicine | Admitting: Emergency Medicine

## 2018-12-26 ENCOUNTER — Encounter (HOSPITAL_COMMUNITY): Payer: Self-pay

## 2018-12-26 ENCOUNTER — Emergency Department (HOSPITAL_COMMUNITY): Payer: Self-pay

## 2018-12-26 DIAGNOSIS — R102 Pelvic and perineal pain: Secondary | ICD-10-CM | POA: Insufficient documentation

## 2018-12-26 DIAGNOSIS — Z91040 Latex allergy status: Secondary | ICD-10-CM | POA: Insufficient documentation

## 2018-12-26 DIAGNOSIS — F17210 Nicotine dependence, cigarettes, uncomplicated: Secondary | ICD-10-CM | POA: Insufficient documentation

## 2018-12-26 LAB — QUANTIFERON-TB GOLD PLUS
QuantiFERON Mitogen Value: >10 IU/mL
QuantiFERON Nil Value: 0.03 IU/mL
QuantiFERON TB1 Ag Value: 0.03 IU/mL
QuantiFERON TB2 Ag Value: 0.03 IU/mL
QuantiFERON-TB Gold Plus: NEGATIVE

## 2018-12-26 LAB — URINALYSIS, ROUTINE W REFLEX MICROSCOPIC
Bacteria, UA: NONE SEEN
Bilirubin Urine: NEGATIVE
Glucose, UA: NEGATIVE mg/dL
Ketones, ur: 5 mg/dL — AB
Leukocytes, UA: NEGATIVE
Nitrite: NEGATIVE
Protein, ur: NEGATIVE mg/dL
Specific Gravity, Urine: 1.003 — ABNORMAL LOW (ref 1.005–1.030)
pH: 6 (ref 5.0–8.0)

## 2018-12-26 LAB — WET PREP, GENITAL
Clue Cells Wet Prep HPF POC: NONE SEEN
Sperm: NONE SEEN
Trich, Wet Prep: NONE SEEN
Yeast Wet Prep HPF POC: NONE SEEN

## 2018-12-26 LAB — POC URINE PREG, ED: Preg Test, Ur: NEGATIVE

## 2018-12-26 MED ORDER — NYSTATIN 100000 UNIT/ML MT SUSP: 500000.0000 [IU] | Freq: Four times a day (QID) | OROMUCOSAL | 0 refills | Status: DC

## 2018-12-26 NOTE — Discharge Instructions (Addendum)
Please read attached information. If you experience any new or worsening signs or symptoms please return to the emergency room for evaluation. Please follow-up with your primary care provider or specialist as discussed.  °
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
## 2018-12-26 NOTE — ED Triage Notes (Signed)
Patient here to have painful rash on tongue checked and ongoing reported dysuria. States that she just completed her BV treatment with flagyl and another unknown antibiotic for possible UTI. Alert and oriented, NAD

## 2018-12-27 LAB — GC/CHLAMYDIA PROBE AMP (~~LOC~~) NOT AT ARMC
Chlamydia: NEGATIVE
Neisseria Gonorrhea: NEGATIVE

## 2018-12-28 ENCOUNTER — Encounter: Payer: Self-pay | Admitting: Family Medicine

## 2018-12-28 ENCOUNTER — Ambulatory Visit (INDEPENDENT_AMBULATORY_CARE_PROVIDER_SITE_OTHER): Payer: Medicaid Other | Admitting: Family Medicine

## 2018-12-28 ENCOUNTER — Ambulatory Visit: Payer: Medicaid Other | Admitting: Obstetrics and Gynecology

## 2018-12-28 VITALS — BP 130/94 | HR 102 | Temp 98.8°F | Ht 64.0 in | Wt 126.2 lb

## 2018-12-28 DIAGNOSIS — Z09 Encounter for follow-up examination after completed treatment for conditions other than malignant neoplasm: Secondary | ICD-10-CM

## 2018-12-28 DIAGNOSIS — Z1159 Encounter for screening for other viral diseases: Secondary | ICD-10-CM

## 2018-12-28 DIAGNOSIS — F32A Depression, unspecified: Secondary | ICD-10-CM

## 2018-12-28 DIAGNOSIS — R8290 Unspecified abnormal findings in urine: Secondary | ICD-10-CM

## 2018-12-28 DIAGNOSIS — F419 Anxiety disorder, unspecified: Secondary | ICD-10-CM

## 2018-12-28 DIAGNOSIS — F329 Major depressive disorder, single episode, unspecified: Secondary | ICD-10-CM

## 2018-12-28 DIAGNOSIS — R300 Dysuria: Secondary | ICD-10-CM

## 2018-12-28 LAB — POCT URINALYSIS DIP (MANUAL ENTRY)
Bilirubin, UA: NEGATIVE
Glucose, UA: NEGATIVE mg/dL
Ketones, POC UA: NEGATIVE mg/dL
Leukocytes, UA: NEGATIVE
Nitrite, UA: NEGATIVE
Protein Ur, POC: NEGATIVE mg/dL
Spec Grav, UA: 1.015 (ref 1.010–1.025)
Urobilinogen, UA: 0.2 E.U./dL
pH, UA: 6.5 (ref 5.0–8.0)

## 2018-12-29 ENCOUNTER — Telehealth: Payer: Self-pay

## 2018-12-29 LAB — C-REACTIVE PROTEIN: CRP: <1 mg/L (ref 0–10)

## 2018-12-29 LAB — HEPATITIS PANEL, ACUTE
Hep A IgM: NEGATIVE
Hep B C IgM: NEGATIVE
Hep C Virus Ab: <0.1 s/co ratio (ref 0.0–0.9)
Hepatitis B Surface Ag: NEGATIVE

## 2018-12-29 LAB — LACTATE DEHYDROGENASE: LDH: 170 IU/L (ref 119–226)

## 2018-12-29 LAB — SEDIMENTATION RATE: Sed Rate: 2 mm/hr (ref 0–32)

## 2018-12-29 NOTE — Telephone Encounter (Signed)
Patient is very upset stating that her blood pressure was evaluated and her pulse was high as well. Patient feels like that her health is not been taking seriously because she does not have health insurance and she has sores in her mouth that she is not getting medication for. Patient is advise to go to the hospital if she feels like her condition is getting worse.

## 2018-12-30 ENCOUNTER — Other Ambulatory Visit: Payer: Self-pay

## 2018-12-30 ENCOUNTER — Ambulatory Visit: Payer: Medicaid Other | Admitting: Family Medicine

## 2018-12-30 ENCOUNTER — Emergency Department (HOSPITAL_COMMUNITY)
Admission: EM | Admit: 2018-12-30 | Discharge: 2018-12-30 | Disposition: A | Discharge status: Home / Self Care | Payer: Medicaid Other | Attending: Emergency Medicine | Admitting: Emergency Medicine

## 2018-12-30 ENCOUNTER — Encounter (HOSPITAL_COMMUNITY): Payer: Self-pay | Admitting: Emergency Medicine

## 2018-12-30 DIAGNOSIS — F4323 Adjustment disorder with mixed anxiety and depressed mood: Secondary | ICD-10-CM | POA: Insufficient documentation

## 2018-12-30 DIAGNOSIS — F419 Anxiety disorder, unspecified: Secondary | ICD-10-CM | POA: Insufficient documentation

## 2018-12-30 DIAGNOSIS — Z87891 Personal history of nicotine dependence: Secondary | ICD-10-CM | POA: Insufficient documentation

## 2018-12-30 DIAGNOSIS — F329 Major depressive disorder, single episode, unspecified: Secondary | ICD-10-CM | POA: Insufficient documentation

## 2018-12-30 DIAGNOSIS — F32A Depression, unspecified: Secondary | ICD-10-CM | POA: Insufficient documentation

## 2018-12-30 LAB — POCT I-STAT EG7
Bicarbonate: 25.4 mmol/L (ref 20.0–28.0)
Calcium, Ion: 1.18 mmol/L (ref 1.15–1.40)
HCT: 40 % (ref 36.0–46.0)
HEMOGLOBIN: 13.6 g/dL (ref 12.0–15.0)
O2 Saturation: 80 %
POTASSIUM: 3.6 mmol/L (ref 3.5–5.1)
Sodium: 140 mmol/L (ref 135–145)
TCO2: 27 mmol/L (ref 22–32)
pCO2, Ven: 41.6 mmHg — ABNORMAL LOW (ref 44.0–60.0)
pH, Ven: 7.394 (ref 7.250–7.430)
pO2, Ven: 44 mmHg (ref 32.0–45.0)

## 2018-12-30 LAB — URINALYSIS, ROUTINE W REFLEX MICROSCOPIC
Bilirubin Urine: NEGATIVE
Glucose, UA: NEGATIVE mg/dL
Hgb urine dipstick: NEGATIVE
Ketones, ur: 20 mg/dL — AB
LEUKOCYTE UA: NEGATIVE
NITRITE: NEGATIVE
Protein, ur: NEGATIVE mg/dL
Specific Gravity, Urine: 1.021 (ref 1.005–1.030)
pH: 5 (ref 5.0–8.0)

## 2018-12-30 LAB — URINE CULTURE

## 2018-12-30 LAB — I-STAT BETA HCG BLOOD, ED (MC, WL, AP ONLY): I-stat hCG, quantitative: <5 m[IU]/mL (ref <5)

## 2018-12-30 LAB — I-STAT CREATININE, ED: Creatinine, Ser: 0.7 mg/dL (ref 0.44–1.00)

## 2018-12-30 LAB — CBG MONITORING, ED: Glucose-Capillary: 68 mg/dL — ABNORMAL LOW (ref 70–99)

## 2018-12-30 NOTE — Discharge Instructions (Signed)
The testing today does not show any serious medical conditions.  You are going through a difficult time with many stressors.  Counseling can help.  Your primary care provider months to see you next week for further evaluation and help as needed.  You can was return here if needed for problems.

## 2018-12-30 NOTE — ED Notes (Signed)
Pt verbalized understanding of discharge instructions and denies any further questions at this time.   
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
## 2018-12-30 NOTE — ED Triage Notes (Signed)
Pt here with generalized numbness she states "I feel like my lungs cant keep up with my heart." She reports family hx of stroke and feels like she may be having one. Denies head trauma or LOC, no fevers.

## 2018-12-30 NOTE — ED Notes (Signed)
Pt tolerating PO fluids and food without difficulty.

## 2018-12-30 NOTE — ED Notes (Signed)
ED Provider at bedside. 
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
## 2019-01-04 ENCOUNTER — Ambulatory Visit: Payer: Medicaid Other | Admitting: Family Medicine

## 2019-02-22 ENCOUNTER — Ambulatory Visit: Payer: Medicaid Other | Admitting: Family Medicine

## 2019-07-05 ENCOUNTER — Other Ambulatory Visit: Payer: Self-pay

## 2019-07-05 ENCOUNTER — Encounter (HOSPITAL_COMMUNITY): Payer: Self-pay | Admitting: Emergency Medicine

## 2019-07-05 ENCOUNTER — Emergency Department (HOSPITAL_COMMUNITY)
Admission: EM | Admit: 2019-07-05 | Discharge: 2019-07-05 | Disposition: A | Discharge status: Home / Self Care | Payer: Self-pay | Attending: Emergency Medicine | Admitting: Emergency Medicine

## 2019-07-05 DIAGNOSIS — R05 Cough: Secondary | ICD-10-CM | POA: Insufficient documentation

## 2019-07-05 DIAGNOSIS — N390 Urinary tract infection, site not specified: Secondary | ICD-10-CM | POA: Insufficient documentation

## 2019-07-05 DIAGNOSIS — Z113 Encounter for screening for infections with a predominantly sexual mode of transmission: Secondary | ICD-10-CM | POA: Insufficient documentation

## 2019-07-05 DIAGNOSIS — R0789 Other chest pain: Secondary | ICD-10-CM | POA: Insufficient documentation

## 2019-07-05 DIAGNOSIS — F17200 Nicotine dependence, unspecified, uncomplicated: Secondary | ICD-10-CM | POA: Insufficient documentation

## 2019-07-05 DIAGNOSIS — R0600 Dyspnea, unspecified: Secondary | ICD-10-CM | POA: Insufficient documentation

## 2019-07-05 DIAGNOSIS — G8929 Other chronic pain: Secondary | ICD-10-CM | POA: Insufficient documentation

## 2019-07-05 LAB — WET PREP, GENITAL
Clue Cells Wet Prep HPF POC: NONE SEEN
Sperm: NONE SEEN
Trich, Wet Prep: NONE SEEN
Yeast Wet Prep HPF POC: NONE SEEN

## 2019-07-05 LAB — URINALYSIS, ROUTINE W REFLEX MICROSCOPIC
Bilirubin Urine: NEGATIVE
Glucose, UA: NEGATIVE mg/dL
Hgb urine dipstick: NEGATIVE
Ketones, ur: NEGATIVE mg/dL
Nitrite: NEGATIVE
Protein, ur: NEGATIVE mg/dL
Specific Gravity, Urine: 1.021 (ref 1.005–1.030)
pH: 8 (ref 5.0–8.0)

## 2019-07-05 LAB — PREGNANCY, URINE: Preg Test, Ur: NEGATIVE

## 2019-07-05 MED ORDER — ALBUTEROL SULFATE HFA 108 (90 BASE) MCG/ACT IN AERS
1.0000 | INHALATION_SPRAY | Freq: Once | RESPIRATORY_TRACT | Status: AC
  Administered 2019-07-05: 18:00:00 2 via RESPIRATORY_TRACT
  Filled 2019-07-05: qty 6.7

## 2019-07-05 MED ORDER — CEPHALEXIN 500 MG PO CAPS: 500.0000 mg | ORAL_CAPSULE | Freq: Two times a day (BID) | ORAL | 0 refills | Status: DC

## 2019-07-05 MED ORDER — NAPROXEN 250 MG PO TABS
500.0000 mg | ORAL_TABLET | Freq: Once | ORAL | Status: AC
  Administered 2019-07-05: 500 mg via ORAL
  Filled 2019-07-05: qty 2

## 2019-07-05 NOTE — Discharge Instructions (Signed)
Use inhaler as needed for chest tightness and shortness of breath Don't smoke! Take Keflex twice daily for 5 days for possible UTI Please return if worsening

## 2019-07-05 NOTE — ED Triage Notes (Signed)
Pt arrives to ED from home with complaints of burning on urination for the last two weeks. Pt states that she has anxiety and feels her heart races from time to time.

## 2019-07-05 NOTE — ED Notes (Signed)
Pelvic cart placed inside the room.
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
## 2019-07-07 ENCOUNTER — Telehealth: Payer: Self-pay | Admitting: Obstetrics and Gynecology

## 2019-07-07 LAB — GC/CHLAMYDIA PROBE AMP (~~LOC~~) NOT AT ARMC
Chlamydia: NEGATIVE
Neisseria Gonorrhea: NEGATIVE

## 2019-07-07 LAB — URINE CULTURE: Culture: 60000 — AB

## 2019-07-07 NOTE — Telephone Encounter (Signed)
Kellie Deleon called to schedule an appointment. Informed her Dr Elly Modena was her GYN provider. She stated she needed to see first provider available.

## 2019-07-08 ENCOUNTER — Telehealth: Payer: Self-pay | Admitting: Emergency Medicine

## 2019-07-08 NOTE — Telephone Encounter (Signed)
Post ED Visit - Positive Culture Follow-up: Successful Patient Follow-Up  Culture assessed and recommendations reviewed by:  []  Elenor Quinones, Pharm.D. []  Heide Guile, Pharm.D., BCPS AQ-ID []  Parks Neptune, Pharm.D., BCPS []  Alycia Rossetti, Pharm.D., BCPS []  [place], Pharm.D., BCPS, AAHIVP []  Legrand Como, Pharm.D., BCPS, AAHIVP [x]  Salome Arnt, PharmD, BCPS []  [name], PharmD, BCPS []  Hughes Better, PharmD, BCPS []  , PharmD  Positive urine culture  []  Patient discharged without antimicrobial prescription and treatment is now indicated [x]  Organism is resistant to prescribed ED discharge antimicrobial []  Patient with positive blood cultures  Changes discussed with ED provider: Carmon Sails, PA New antibiotic prescription:  Cefidinir 300 mg PO BID x five days Called to [facility] [phone]  Contacted patient, date 07/08/2019, time [place] 07/08/2019, 5:20 PM

## 2019-07-08 NOTE — Progress Notes (Signed)
ED Antimicrobial Stewardship Positive Culture Follow Up   Taiwan Millon is an 28 y.o. female who presented to [facility] on 07/05/2019 with a chief complaint of  Chief Complaint  Patient presents with  . Dysuria  . Anxiety    Recent Results (from the past 720 hour(s))  Urine culture     Status: Abnormal   Collection Time: 07/05/19  3:45 PM   Specimen: Urine, Clean Catch  Result Value Ref Range Status   Specimen Description URINE, CLEAN CATCH  Final   Special Requests   Final    NONE Performed at [facility], [address] [place], [place] [address]    Culture 60,000 COLONIES/mL CITROBACTER AMALONATICUS (A)  Final   Report Status 07/07/2019 FINAL  Final   Organism ID, Bacteria CITROBACTER AMALONATICUS (A)  Final      Susceptibility   Citrobacter amalonaticus - MIC*    CEFAZOLIN >=64 RESISTANT Resistant     CEFTRIAXONE <=1 SENSITIVE Sensitive     CIPROFLOXACIN <=0.25 SENSITIVE Sensitive     GENTAMICIN <=1 SENSITIVE Sensitive     IMIPENEM <=0.25 SENSITIVE Sensitive     NITROFURANTOIN 64 INTERMEDIATE Intermediate     TRIMETH/SULFA <=20 SENSITIVE Sensitive     PIP/TAZO <=4 SENSITIVE Sensitive     * 60,000 COLONIES/mL CITROBACTER AMALONATICUS  Wet prep, genital     Status: Abnormal   Collection Time: 07/05/19  6:44 PM   Specimen: Vaginal  Result Value Ref Range Status   Yeast Wet Prep HPF POC NONE SEEN NONE SEEN Final   Trich, Wet Prep NONE SEEN NONE SEEN Final   Clue Cells Wet Prep HPF POC NONE SEEN NONE SEEN Final   WBC, Wet Prep HPF POC FEW (A) NONE SEEN Final    Comment: Specimen diluted due to transport tube containing more than 1 ml of saline, interpret results with caution.   Sperm NONE SEEN  Final    Comment: Performed at [facility], [place] [address], [place], [place] [address]    [x]  Treated with cephalexin, organism resistant to prescribed antimicrobial []  Patient discharged originally without antimicrobial agent and treatment is now indicated  New  antibiotic prescription: DC cephalexin, start cefdinir 300mg  PO BID x 5 days  ED Provider: Carmon Sails, PA  Coy Rochford, Rande Lawman 07/08/2019, 9:44 AM Clinical Pharmacist Monday - Friday phone -  [phone] Saturday - Sunday phone - [phone]

## 2019-07-31 ENCOUNTER — Telehealth: Payer: Self-pay | Admitting: Obstetrics and Gynecology

## 2019-07-31 NOTE — Telephone Encounter (Signed)
Called the patient to inform of the upcoming appointment, informed of wearing a face mask, no children or visitors due to covid19 restrictions. The patient also answered no to the covid19 screening questions. °

## 2019-08-01 ENCOUNTER — Other Ambulatory Visit: Payer: Self-pay | Admitting: Family Medicine

## 2019-08-01 ENCOUNTER — Ambulatory Visit (INDEPENDENT_AMBULATORY_CARE_PROVIDER_SITE_OTHER): Payer: Self-pay | Admitting: Family Medicine

## 2019-08-01 ENCOUNTER — Other Ambulatory Visit: Payer: Self-pay

## 2019-08-01 VITALS — BP 114/73 | HR 66 | Wt 166.9 lb

## 2019-08-01 DIAGNOSIS — Z113 Encounter for screening for infections with a predominantly sexual mode of transmission: Secondary | ICD-10-CM

## 2019-08-01 DIAGNOSIS — R102 Pelvic and perineal pain: Secondary | ICD-10-CM | POA: Insufficient documentation

## 2019-08-01 DIAGNOSIS — Z124 Encounter for screening for malignant neoplasm of cervix: Secondary | ICD-10-CM

## 2019-08-01 DIAGNOSIS — N898 Other specified noninflammatory disorders of vagina: Secondary | ICD-10-CM | POA: Insufficient documentation

## 2019-08-01 MED ORDER — NORGESTIM-ETH ESTRAD TRIPHASIC 0.18/0.215/0.25 MG-25 MCG PO TABS: 1.0000 | ORAL_TABLET | Freq: Every day | ORAL | 11 refills | Status: DC
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
## 2019-08-01 NOTE — Assessment & Plan Note (Signed)
Unclear etiology. Has had extensive imaging and lab workup (see HPI), notable only for a positive urine culture last month that has been treated. Will obtain TOC UCx today and also obtained wet prep for report of vaginal discharge (though exam unremarkable) but do not suspect infection as cause of her symptoms. Possible element of endometriosis though history does not definitively point in this direction but may benefit from empiric trial of OCP's which she agreed to today. Location not totally consistent but some aspect of history suggestive of interstitial cystitis. Given lack of clarity suggested we proceed w trial of OCP's and follow up in 1 month to assess if there is any improvement in symptoms.

## 2019-08-01 NOTE — Progress Notes (Signed)
Last pap 2014 Tubal ligation 2016

## 2019-08-01 NOTE — Assessment & Plan Note (Signed)
Wet prep today

## 2019-08-01 NOTE — Assessment & Plan Note (Signed)
Recommended pap today but declined due to insurance issues, recommended BCCCP as soon as possible.

## 2019-08-02 ENCOUNTER — Encounter: Payer: Self-pay | Admitting: Family Medicine

## 2019-08-02 ENCOUNTER — Ambulatory Visit (INDEPENDENT_AMBULATORY_CARE_PROVIDER_SITE_OTHER): Payer: Self-pay | Admitting: Family Medicine

## 2019-08-02 VITALS — BP 113/72 | HR 66 | Temp 98.7°F | Ht 64.0 in | Wt 168.2 lb

## 2019-08-02 DIAGNOSIS — F419 Anxiety disorder, unspecified: Secondary | ICD-10-CM

## 2019-08-02 DIAGNOSIS — Z09 Encounter for follow-up examination after completed treatment for conditions other than malignant neoplasm: Secondary | ICD-10-CM

## 2019-08-02 DIAGNOSIS — R0602 Shortness of breath: Secondary | ICD-10-CM

## 2019-08-02 DIAGNOSIS — F329 Major depressive disorder, single episode, unspecified: Secondary | ICD-10-CM

## 2019-08-02 DIAGNOSIS — R1084 Generalized abdominal pain: Secondary | ICD-10-CM

## 2019-08-02 LAB — CERVICOVAGINAL ANCILLARY ONLY
Bacterial Vaginitis (gardnerella): NEGATIVE
Candida Glabrata: NEGATIVE
Candida Vaginitis: NEGATIVE
Molecular Disclaimer: NEGATIVE
Molecular Disclaimer: NEGATIVE
Molecular Disclaimer: NEGATIVE
Molecular Disclaimer: NORMAL
Trichomonas: NEGATIVE

## 2019-08-02 NOTE — Progress Notes (Signed)
Called and made an appt for patient for 08/02/19.pt is aware.
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
## 2019-08-03 LAB — URINE CULTURE: Organism ID, Bacteria: NO GROWTH

## 2019-08-03 LAB — CERVICOVAGINAL ANCILLARY ONLY
Chlamydia: NEGATIVE
Neisseria Gonorrhea: NEGATIVE

## 2019-08-04 DIAGNOSIS — R0602 Shortness of breath: Secondary | ICD-10-CM | POA: Insufficient documentation

## 2019-08-04 DIAGNOSIS — R1084 Generalized abdominal pain: Secondary | ICD-10-CM | POA: Insufficient documentation

## 2019-08-04 MED ORDER — ALBUTEROL SULFATE HFA 108 (90 BASE) MCG/ACT IN AERS: 2.0000 | INHALATION_SPRAY | Freq: Four times a day (QID) | RESPIRATORY_TRACT | 11 refills | Status: DC | PRN

## 2019-08-07 ENCOUNTER — Ambulatory Visit: Payer: Self-pay | Admitting: Family Medicine

## 2019-09-04 ENCOUNTER — Other Ambulatory Visit: Payer: Self-pay

## 2019-09-04 ENCOUNTER — Emergency Department (HOSPITAL_COMMUNITY)
Admission: EM | Admit: 2019-09-04 | Discharge: 2019-09-04 | Disposition: A | Discharge status: Home / Self Care | Payer: Medicaid Other | Attending: Emergency Medicine | Admitting: Emergency Medicine

## 2019-09-04 DIAGNOSIS — Z87891 Personal history of nicotine dependence: Secondary | ICD-10-CM | POA: Insufficient documentation

## 2019-09-04 DIAGNOSIS — F1210 Cannabis abuse, uncomplicated: Secondary | ICD-10-CM | POA: Diagnosis not present

## 2019-09-04 DIAGNOSIS — R339 Retention of urine, unspecified: Secondary | ICD-10-CM | POA: Insufficient documentation

## 2019-09-04 DIAGNOSIS — N730 Acute parametritis and pelvic cellulitis: Secondary | ICD-10-CM | POA: Diagnosis not present

## 2019-09-04 DIAGNOSIS — A6004 Herpesviral vulvovaginitis: Secondary | ICD-10-CM | POA: Diagnosis not present

## 2019-09-04 DIAGNOSIS — K5900 Constipation, unspecified: Secondary | ICD-10-CM | POA: Diagnosis present

## 2019-09-04 DIAGNOSIS — R338 Other retention of urine: Secondary | ICD-10-CM

## 2019-09-04 LAB — WET PREP, GENITAL
Sperm: NONE SEEN
Trich, Wet Prep: NONE SEEN
Yeast Wet Prep HPF POC: NONE SEEN

## 2019-09-04 LAB — CBC WITH DIFFERENTIAL/PLATELET
Abs Immature Granulocytes: 0.04 10*3/uL (ref 0.00–0.07)
Basophils Absolute: 0.1 10*3/uL (ref 0.0–0.1)
Basophils Relative: 1 %
Eosinophils Absolute: 0.1 10*3/uL (ref 0.0–0.5)
Eosinophils Relative: 1 %
HCT: 43.9 % (ref 36.0–46.0)
Hemoglobin: 14.1 g/dL (ref 12.0–15.0)
Immature Granulocytes: 0 %
Lymphocytes Relative: 25 %
Lymphs Abs: 2.3 10*3/uL (ref 0.7–4.0)
MCH: 31.3 pg (ref 26.0–34.0)
MCHC: 32.1 g/dL (ref 30.0–36.0)
MCV: 97.6 fL (ref 80.0–100.0)
Monocytes Absolute: 0.4 10*3/uL (ref 0.1–1.0)
Monocytes Relative: 5 %
Neutro Abs: 6.1 10*3/uL (ref 1.7–7.7)
Neutrophils Relative %: 68 %
Platelets: 230 10*3/uL (ref 150–400)
RBC: 4.5 MIL/uL (ref 3.87–5.11)
RDW: 11.6 % (ref 11.5–15.5)
WBC: 8.9 10*3/uL (ref 4.0–10.5)
nRBC: 0 % (ref 0.0–0.2)

## 2019-09-04 LAB — COMPREHENSIVE METABOLIC PANEL
ALT: 12 U/L (ref 0–44)
AST: 14 U/L — ABNORMAL LOW (ref 15–41)
Albumin: 4.3 g/dL (ref 3.5–5.0)
Alkaline Phosphatase: 70 U/L (ref 38–126)
Anion gap: 10 (ref 5–15)
BUN: 8 mg/dL (ref 6–20)
CO2: 26 mmol/L (ref 22–32)
Calcium: 9.3 mg/dL (ref 8.9–10.3)
Chloride: 100 mmol/L (ref 98–111)
Creatinine, Ser: 0.72 mg/dL (ref 0.44–1.00)
GFR calc Af Amer: >60 mL/min (ref >60)
GFR calc non Af Amer: >60 mL/min (ref >60)
Glucose, Bld: 84 mg/dL (ref 70–99)
Potassium: 3.8 mmol/L (ref 3.5–5.1)
Sodium: 136 mmol/L (ref 135–145)
Total Bilirubin: 0.7 mg/dL (ref 0.3–1.2)
Total Protein: 8 g/dL (ref 6.5–8.1)

## 2019-09-04 LAB — I-STAT BETA HCG BLOOD, ED (MC, WL, AP ONLY): I-stat hCG, quantitative: <5 m[IU]/mL (ref <5)

## 2019-09-04 LAB — HIV ANTIBODY (ROUTINE TESTING W REFLEX): HIV Screen 4th Generation wRfx: NONREACTIVE

## 2019-09-04 LAB — URINALYSIS, ROUTINE W REFLEX MICROSCOPIC
Bilirubin Urine: NEGATIVE
Glucose, UA: NEGATIVE mg/dL
Hgb urine dipstick: NEGATIVE
Ketones, ur: NEGATIVE mg/dL
Leukocytes,Ua: NEGATIVE
Nitrite: NEGATIVE
Protein, ur: NEGATIVE mg/dL
Specific Gravity, Urine: 1.015 (ref 1.005–1.030)
pH: 6 (ref 5.0–8.0)

## 2019-09-04 MED ORDER — SODIUM CHLORIDE 0.9 % IV BOLUS
1000.0000 mL | Freq: Once | INTRAVENOUS | Status: AC
  Administered 2019-09-04: 1000 mL via INTRAVENOUS

## 2019-09-04 MED ORDER — VALACYCLOVIR HCL 500 MG PO TABS
1000.0000 mg | ORAL_TABLET | Freq: Once | ORAL | Status: AC
  Administered 2019-09-04: 1000 mg via ORAL
  Filled 2019-09-04: qty 2

## 2019-09-04 MED ORDER — HYDROCODONE-ACETAMINOPHEN 5-325 MG PO TABS
2.0000 | ORAL_TABLET | Freq: Once | ORAL | Status: AC
  Administered 2019-09-04: 2 via ORAL
  Filled 2019-09-04: qty 2

## 2019-09-04 MED ORDER — DOXYCYCLINE HYCLATE 100 MG PO CAPS: 100.0000 mg | ORAL_CAPSULE | Freq: Two times a day (BID) | ORAL | 0 refills | Status: AC

## 2019-09-04 MED ORDER — METRONIDAZOLE 500 MG PO TABS: 500.0000 mg | ORAL_TABLET | Freq: Two times a day (BID) | ORAL | 0 refills | Status: DC

## 2019-09-04 MED ORDER — CEFTRIAXONE SODIUM 250 MG IJ SOLR
250.0000 mg | Freq: Once | INTRAMUSCULAR | Status: AC
  Administered 2019-09-04: 250 mg via INTRAMUSCULAR
  Filled 2019-09-04: qty 250

## 2019-09-04 MED ORDER — DOXYCYCLINE HYCLATE 100 MG PO TABS
100.0000 mg | ORAL_TABLET | Freq: Once | ORAL | Status: AC
  Administered 2019-09-04: 100 mg via ORAL
  Filled 2019-09-04: qty 1

## 2019-09-04 MED ORDER — HYDROCODONE-ACETAMINOPHEN 5-325 MG PO TABS: 1.0000 | ORAL_TABLET | ORAL | 0 refills | Status: DC | PRN

## 2019-09-04 MED ORDER — LIDOCAINE HCL 1 % IJ SOLN
INTRAMUSCULAR | Status: AC
  Filled 2019-09-04: qty 20

## 2019-09-04 MED ORDER — VALACYCLOVIR HCL 1 G PO TABS: 1000.0000 mg | ORAL_TABLET | Freq: Two times a day (BID) | ORAL | 0 refills | Status: AC

## 2019-09-04 MED ORDER — METRONIDAZOLE 500 MG PO TABS
500.0000 mg | ORAL_TABLET | Freq: Once | ORAL | Status: AC
  Administered 2019-09-04: 500 mg via ORAL
  Filled 2019-09-04: qty 1

## 2019-09-04 NOTE — ED Notes (Signed)
Bladder scanned pt and she is retaining about 750 mL
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
## 2019-09-04 NOTE — ED Triage Notes (Addendum)
Pt from home.  Pt states she has not urinated or had a BM in 2 days.  Pt states she started having sex again recently days ago.  Pt also has a rash on the L side side of her bottom.  Pt c/o of rectal bleeding.

## 2019-09-04 NOTE — Discharge Instructions (Signed)
If you develop worsening, continued, or recurrent abdominal pain, uncontrolled vomiting, fever, chest or back pain, or any other new/concerning symptoms then return to the ER for evaluation.   Do not drink alcohol while on the medications prescribed.  Follow-up with the urologist later this week or early next week to have the Foley catheter removed.

## 2019-09-05 LAB — URINE CULTURE: Culture: >=100000 — AB

## 2019-09-05 LAB — RPR: RPR Ser Ql: NONREACTIVE

## 2019-09-05 LAB — GC/CHLAMYDIA PROBE AMP (~~LOC~~) NOT AT ARMC
Chlamydia: NEGATIVE
Neisseria Gonorrhea: NEGATIVE

## 2019-09-05 LAB — HSV 1 ANTIBODY, IGG: HSV 1 Glycoprotein G Ab, IgG: <0.91 index (ref 0.00–0.90)

## 2019-09-05 LAB — HSV 2 ANTIBODY, IGG: HSV 2 Glycoprotein G Ab, IgG: <0.91 index (ref 0.00–0.90)

## 2019-09-06 ENCOUNTER — Telehealth: Payer: Self-pay | Admitting: *Deleted

## 2019-09-06 LAB — HSV DNA BY PCR (REFERENCE LAB)
HSV 1 DNA: NEGATIVE
HSV 2 DNA: NEGATIVE

## 2019-09-06 NOTE — Telephone Encounter (Signed)
Post ED Visit - Positive Culture Follow-up  Culture report reviewed by antimicrobial stewardship pharmacist: [place] Team []  Elenor Quinones, Pharm.D. []  Heide Guile, Pharm.D., BCPS AQ-ID []  Parks Neptune, Pharm.D., BCPS []  Alycia Rossetti, Pharm.D., BCPS []  [place], Pharm.D., BCPS, AAHIVP []  Legrand Como, Pharm.D., BCPS, AAHIVP []  Salome Arnt, PharmD, BCPS []  [name], PharmD, BCPS []  Hughes Better, PharmD, BCPS []  Leeroy Cha, PharmD []  Laqueta Linden, PharmD, BCPS []  Albertina Parr, PharmD  [place] Team [x]  Leodis Sias, PharmD []  Lindell Spar, PharmD []  Royetta Asal, PharmD []  Graylin Shiver, Rph []  Rema Fendt) Glennon Mac, PharmD []  Arlyn Dunning, PharmD []  Netta Cedars, PharmD []  Dia Sitter, PharmD []  Leone Haven, PharmD []  Gretta Arab, PharmD []  Theodis Shove, PharmD []  Peggyann Juba, PharmD []  Reuel Boom, PharmD   Positive urine culture Treated with Doxycycline Hyclate, organism sensitive to the same and no further patient follow-up is required at this time.  Harlon Flor [facility] 09/06/2019, 12:31 PM

## 2019-09-12 ENCOUNTER — Encounter (HOSPITAL_COMMUNITY): Payer: Self-pay | Admitting: Emergency Medicine

## 2019-09-12 ENCOUNTER — Other Ambulatory Visit: Payer: Self-pay

## 2019-09-12 ENCOUNTER — Emergency Department (HOSPITAL_COMMUNITY)
Admission: EM | Admit: 2019-09-12 | Discharge: 2019-09-13 | Disposition: A | Discharge status: Home / Self Care | Payer: Medicaid Other | Attending: Emergency Medicine | Admitting: Emergency Medicine

## 2019-09-12 DIAGNOSIS — Z5321 Procedure and treatment not carried out due to patient leaving prior to being seen by health care provider: Secondary | ICD-10-CM | POA: Insufficient documentation

## 2019-09-12 DIAGNOSIS — K5900 Constipation, unspecified: Secondary | ICD-10-CM | POA: Diagnosis present

## 2019-09-12 NOTE — ED Notes (Signed)
PT HAS A URINE CATH. UNABLE TO GIVEN URINE SAMPLE AT THIS TIME

## 2019-09-12 NOTE — ED Triage Notes (Signed)
Patient here from home with complaints of constipation. Seen here for same x1 week.

## 2019-09-18 ENCOUNTER — Encounter: Payer: Self-pay | Admitting: Nurse Practitioner

## 2019-09-19 DIAGNOSIS — R338 Other retention of urine: Secondary | ICD-10-CM | POA: Diagnosis not present
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
## 2019-09-25 ENCOUNTER — Other Ambulatory Visit: Payer: Self-pay

## 2019-09-25 ENCOUNTER — Encounter: Payer: Self-pay | Admitting: Family Medicine

## 2019-09-25 ENCOUNTER — Ambulatory Visit: Payer: Medicaid Other | Admitting: Nurse Practitioner

## 2019-09-25 ENCOUNTER — Other Ambulatory Visit (HOSPITAL_COMMUNITY)
Admission: RE | Admit: 2019-09-25 | Discharge: 2019-09-25 | Disposition: A | Discharge status: Home / Self Care | Payer: Medicaid Other | Source: Ambulatory Visit | Attending: Family Medicine | Admitting: Family Medicine

## 2019-09-25 ENCOUNTER — Ambulatory Visit (INDEPENDENT_AMBULATORY_CARE_PROVIDER_SITE_OTHER): Payer: Medicaid Other | Admitting: Family Medicine

## 2019-09-25 VITALS — BP 97/65 | HR 70 | Wt 164.2 lb

## 2019-09-25 DIAGNOSIS — Z124 Encounter for screening for malignant neoplasm of cervix: Secondary | ICD-10-CM | POA: Diagnosis not present

## 2019-09-25 DIAGNOSIS — R102 Pelvic and perineal pain: Secondary | ICD-10-CM | POA: Diagnosis not present

## 2019-09-25 DIAGNOSIS — Z3202 Encounter for pregnancy test, result negative: Secondary | ICD-10-CM | POA: Diagnosis not present

## 2019-09-25 LAB — POCT PREGNANCY, URINE: Preg Test, Ur: NEGATIVE

## 2019-09-25 NOTE — Assessment & Plan Note (Addendum)
Ongoing pain, did not take OCP's. Hx of BTL in 2016, given prolonged bleeding obtained UPT which was negative. Recommended to take OCPs, attend pelvic PT, and check back in 3 months to reassess pain.
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
## 2019-09-25 NOTE — Assessment & Plan Note (Signed)
Deferred at last visit due to insurance issues now resolved. Last pap NILM in 2014, repeat obtained today.

## 2019-09-27 LAB — CYTOLOGY - PAP: Diagnosis: NEGATIVE

## 2019-10-03 ENCOUNTER — Ambulatory Visit: Payer: Medicaid Other | Admitting: Gastroenterology

## 2019-10-04 ENCOUNTER — Ambulatory Visit: Payer: Medicaid Other | Attending: Urology | Admitting: Physical Therapy

## 2019-10-25 ENCOUNTER — Ambulatory Visit: Payer: Medicaid Other | Admitting: Gastroenterology

## 2019-11-01 ENCOUNTER — Ambulatory Visit: Payer: Self-pay | Admitting: Family Medicine

## 2019-11-06 DIAGNOSIS — R8271 Bacteriuria: Secondary | ICD-10-CM | POA: Diagnosis not present

## 2019-11-06 DIAGNOSIS — R338 Other retention of urine: Secondary | ICD-10-CM | POA: Diagnosis not present

## 2019-11-23 DIAGNOSIS — N6311 Unspecified lump in the right breast, upper outer quadrant: Secondary | ICD-10-CM | POA: Diagnosis not present

## 2019-11-23 DIAGNOSIS — H0015 Chalazion left lower eyelid: Secondary | ICD-10-CM | POA: Diagnosis not present

## 2019-11-23 DIAGNOSIS — R112 Nausea with vomiting, unspecified: Secondary | ICD-10-CM | POA: Diagnosis not present

## 2019-11-23 DIAGNOSIS — R5383 Other fatigue: Secondary | ICD-10-CM | POA: Diagnosis not present

## 2019-11-23 DIAGNOSIS — M7918 Myalgia, other site: Secondary | ICD-10-CM | POA: Diagnosis not present

## 2019-11-23 DIAGNOSIS — R52 Pain, unspecified: Secondary | ICD-10-CM | POA: Diagnosis not present

## 2019-11-23 DIAGNOSIS — Z7251 High risk heterosexual behavior: Secondary | ICD-10-CM | POA: Diagnosis not present

## 2019-12-04 ENCOUNTER — Ambulatory Visit: Payer: Medicaid Other | Admitting: Family Medicine

## 2019-12-18 DIAGNOSIS — E559 Vitamin D deficiency, unspecified: Secondary | ICD-10-CM

## 2019-12-18 DIAGNOSIS — N63 Unspecified lump in unspecified breast: Secondary | ICD-10-CM

## 2019-12-18 HISTORY — DX: Vitamin D deficiency, unspecified: E55.9

## 2019-12-18 HISTORY — DX: Unspecified lump in unspecified breast: N63.0

## 2020-01-01 ENCOUNTER — Ambulatory Visit: Payer: Medicaid Other | Attending: Urology | Admitting: Physical Therapy

## 2020-01-02 DIAGNOSIS — B370 Candidal stomatitis: Secondary | ICD-10-CM | POA: Diagnosis not present

## 2020-01-02 DIAGNOSIS — J029 Acute pharyngitis, unspecified: Secondary | ICD-10-CM | POA: Diagnosis not present

## 2020-01-02 DIAGNOSIS — Z113 Encounter for screening for infections with a predominantly sexual mode of transmission: Secondary | ICD-10-CM | POA: Diagnosis not present

## 2020-01-03 ENCOUNTER — Ambulatory Visit (INDEPENDENT_AMBULATORY_CARE_PROVIDER_SITE_OTHER): Payer: Medicaid Other | Admitting: Family Medicine

## 2020-01-03 ENCOUNTER — Other Ambulatory Visit: Payer: Self-pay

## 2020-01-03 ENCOUNTER — Encounter: Payer: Self-pay | Admitting: Family Medicine

## 2020-01-03 ENCOUNTER — Other Ambulatory Visit: Payer: Self-pay | Admitting: Family Medicine

## 2020-01-03 VITALS — BP 140/80 | HR 82 | Temp 98.4°F | Ht 65.0 in | Wt 156.8 lb

## 2020-01-03 DIAGNOSIS — R102 Pelvic and perineal pain unspecified side: Secondary | ICD-10-CM

## 2020-01-03 DIAGNOSIS — R194 Change in bowel habit: Secondary | ICD-10-CM | POA: Diagnosis not present

## 2020-01-03 DIAGNOSIS — Z0000 Encounter for general adult medical examination without abnormal findings: Secondary | ICD-10-CM | POA: Diagnosis not present

## 2020-01-03 DIAGNOSIS — Z803 Family history of malignant neoplasm of breast: Secondary | ICD-10-CM

## 2020-01-03 DIAGNOSIS — F419 Anxiety disorder, unspecified: Secondary | ICD-10-CM

## 2020-01-03 DIAGNOSIS — Z09 Encounter for follow-up examination after completed treatment for conditions other than malignant neoplasm: Secondary | ICD-10-CM

## 2020-01-03 DIAGNOSIS — F418 Other specified anxiety disorders: Secondary | ICD-10-CM | POA: Diagnosis not present

## 2020-01-03 DIAGNOSIS — R0602 Shortness of breath: Secondary | ICD-10-CM | POA: Diagnosis not present

## 2020-01-03 DIAGNOSIS — N6310 Unspecified lump in the right breast, unspecified quadrant: Secondary | ICD-10-CM | POA: Diagnosis not present

## 2020-01-03 DIAGNOSIS — F329 Major depressive disorder, single episode, unspecified: Secondary | ICD-10-CM

## 2020-01-03 DIAGNOSIS — R198 Other specified symptoms and signs involving the digestive system and abdomen: Secondary | ICD-10-CM | POA: Diagnosis not present

## 2020-01-03 DIAGNOSIS — R112 Nausea with vomiting, unspecified: Secondary | ICD-10-CM | POA: Diagnosis not present

## 2020-01-03 LAB — POCT URINE PREGNANCY: Preg Test, Ur: NEGATIVE

## 2020-01-03 LAB — POCT URINALYSIS DIPSTICK
Bilirubin, UA: NEGATIVE
Glucose, UA: NEGATIVE
Ketones, UA: NEGATIVE
Leukocytes, UA: NEGATIVE
Nitrite, UA: NEGATIVE
Protein, UA: NEGATIVE
Spec Grav, UA: <=1.005 — AB (ref 1.010–1.025)
Urobilinogen, UA: 0.2 E.U./dL
pH, UA: 5 (ref 5.0–8.0)

## 2020-01-03 MED ORDER — NORGESTIM-ETH ESTRAD TRIPHASIC 0.18/0.215/0.25 MG-25 MCG PO TABS: 1.0000 | ORAL_TABLET | Freq: Every day | ORAL | 11 refills | Status: DC

## 2020-01-03 MED ORDER — ALBUTEROL SULFATE HFA 108 (90 BASE) MCG/ACT IN AERS: 2.0000 | INHALATION_SPRAY | Freq: Four times a day (QID) | RESPIRATORY_TRACT | 11 refills | Status: DC | PRN
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
## 2020-01-04 LAB — VITAMIN D 25 HYDROXY (VIT D DEFICIENCY, FRACTURES): Vit D, 25-Hydroxy: 11.3 ng/mL — ABNORMAL LOW (ref 30.0–100.0)

## 2020-01-04 LAB — COMPREHENSIVE METABOLIC PANEL
ALT: 9 IU/L (ref 0–32)
AST: 20 IU/L (ref 0–40)
Albumin/Globulin Ratio: 1.8 (ref 1.2–2.2)
Albumin: 4.7 g/dL (ref 3.9–5.0)
Alkaline Phosphatase: 73 IU/L (ref 39–117)
BUN/Creatinine Ratio: 10 (ref 9–23)
BUN: 7 mg/dL (ref 6–20)
Bilirubin Total: 0.4 mg/dL (ref 0.0–1.2)
CO2: 25 mmol/L (ref 20–29)
Calcium: 9.7 mg/dL (ref 8.7–10.2)
Chloride: 101 mmol/L (ref 96–106)
Creatinine, Ser: 0.72 mg/dL (ref 0.57–1.00)
GFR calc Af Amer: 131 mL/min/{1.73_m2} (ref >59)
GFR calc non Af Amer: 114 mL/min/{1.73_m2} (ref >59)
Globulin, Total: 2.6 g/dL (ref 1.5–4.5)
Glucose: 73 mg/dL (ref 65–99)
Potassium: 4 mmol/L (ref 3.5–5.2)
Sodium: 139 mmol/L (ref 134–144)
Total Protein: 7.3 g/dL (ref 6.0–8.5)

## 2020-01-04 LAB — CBC WITH DIFFERENTIAL/PLATELET
Basophils Absolute: 0.1 10*3/uL (ref 0.0–0.2)
Basos: 1 %
EOS (ABSOLUTE): 0.1 10*3/uL (ref 0.0–0.4)
Eos: 1 %
Hematocrit: 42.8 % (ref 34.0–46.6)
Hemoglobin: 14.6 g/dL (ref 11.1–15.9)
Immature Grans (Abs): 0 10*3/uL (ref 0.0–0.1)
Immature Granulocytes: 1 %
Lymphocytes Absolute: 2.5 10*3/uL (ref 0.7–3.1)
Lymphs: 30 %
MCH: 32.5 pg (ref 26.6–33.0)
MCHC: 34.1 g/dL (ref 31.5–35.7)
MCV: 95 fL (ref 79–97)
Monocytes Absolute: 0.4 10*3/uL (ref 0.1–0.9)
Monocytes: 5 %
Neutrophils Absolute: 5.2 10*3/uL (ref 1.4–7.0)
Neutrophils: 62 %
Platelets: 312 10*3/uL (ref 150–450)
RBC: 4.49 x10E6/uL (ref 3.77–5.28)
RDW: 11.7 % (ref 11.7–15.4)
WBC: 8.3 10*3/uL (ref 3.4–10.8)

## 2020-01-04 LAB — LIPID PANEL
Chol/HDL Ratio: 1.7 ratio (ref 0.0–4.4)
Cholesterol, Total: 187 mg/dL (ref 100–199)
HDL: 110 mg/dL (ref >39)
LDL Chol Calc (NIH): 65 mg/dL (ref 0–99)
Triglycerides: 61 mg/dL (ref 0–149)
VLDL Cholesterol Cal: 12 mg/dL (ref 5–40)

## 2020-01-04 LAB — TSH: TSH: 0.442 u[IU]/mL — ABNORMAL LOW (ref 0.450–4.500)

## 2020-01-04 LAB — VITAMIN B12: Vitamin B-12: 942 pg/mL (ref 232–1245)

## 2020-01-07 ENCOUNTER — Encounter: Payer: Self-pay | Admitting: Family Medicine

## 2020-01-07 DIAGNOSIS — Z803 Family history of malignant neoplasm of breast: Secondary | ICD-10-CM | POA: Insufficient documentation

## 2020-01-07 DIAGNOSIS — N6310 Unspecified lump in the right breast, unspecified quadrant: Secondary | ICD-10-CM | POA: Insufficient documentation

## 2020-01-09 ENCOUNTER — Telehealth: Payer: Self-pay

## 2020-01-09 ENCOUNTER — Other Ambulatory Visit: Payer: Self-pay | Admitting: Family Medicine

## 2020-01-09 ENCOUNTER — Encounter: Payer: Self-pay | Admitting: Family Medicine

## 2020-01-09 ENCOUNTER — Telehealth: Payer: Self-pay | Admitting: Family Medicine

## 2020-01-09 DIAGNOSIS — E559 Vitamin D deficiency, unspecified: Secondary | ICD-10-CM

## 2020-01-09 MED ORDER — VITAMIN D (ERGOCALCIFEROL) 1.25 MG (50000 UNIT) PO CAPS: 50000.0000 [IU] | ORAL_CAPSULE | ORAL | 6 refills | Status: DC

## 2020-01-09 NOTE — Telephone Encounter (Signed)
Message left for with Christiane Ha (Brother) to have patient call back. (Listed as contact).

## 2020-01-09 NOTE — Telephone Encounter (Signed)
Patient has right breast mass. Order placed for Mammogram. Breast Center has left message for patient contact office for Mammogram/Ultrasound, with no response from patient as of yet. I have also attempted to contact patient. Breast Center will only reach out to her one additional time to schedule. We will attempt to contact patient.

## 2020-01-10 ENCOUNTER — Telehealth: Payer: Self-pay

## 2020-01-10 NOTE — Telephone Encounter (Signed)
A copy of lab results mailed. Along with a reminder message to call [facility] for an appointment. (Referral still active). Patient voicemail has been full for 2 days.

## 2020-01-10 NOTE — Telephone Encounter (Signed)
-----   Message from Kallie Locks, FNP sent at 01/09/2020 11:23 AM EST ----- Vitamin D level is extremely low. Rx for Vitamin D supplement sent to pharmacy today. She should include foods that are high in Vitamin D. These include: Salmon, Cod Liver Oil, Mushrooms, Canned Fish, Milk, and Egg Yolks.  All other labs are stable. Keep follow up appointment. Please inform patient.

## 2020-01-15 DIAGNOSIS — R894 Abnormal immunological findings in specimens from other organs, systems and tissues: Secondary | ICD-10-CM

## 2020-01-15 DIAGNOSIS — R7989 Other specified abnormal findings of blood chemistry: Secondary | ICD-10-CM

## 2020-01-15 DIAGNOSIS — B370 Candidal stomatitis: Secondary | ICD-10-CM

## 2020-01-15 HISTORY — DX: Other specified abnormal findings of blood chemistry: R79.89

## 2020-01-15 HISTORY — DX: Abnormal immunological findings in specimens from other organs, systems and tissues: R89.4

## 2020-01-15 HISTORY — DX: Candidal stomatitis: B37.0

## 2020-01-17 ENCOUNTER — Telehealth: Payer: Self-pay | Admitting: Family Medicine

## 2020-01-17 NOTE — Telephone Encounter (Signed)
If Ms. Gienger calls back. She needs to call [facility] - Ph [phone].  (Patient mailbox is full).

## 2020-01-17 NOTE — Telephone Encounter (Signed)
Pt called wanting to know when her appointment for a mammogram we referred her to was.

## 2020-01-25 ENCOUNTER — Other Ambulatory Visit: Payer: Medicaid Other

## 2020-01-25 ENCOUNTER — Other Ambulatory Visit: Payer: Self-pay

## 2020-01-25 ENCOUNTER — Other Ambulatory Visit: Payer: Self-pay | Admitting: Family Medicine

## 2020-01-25 ENCOUNTER — Ambulatory Visit
Admission: RE | Admit: 2020-01-25 | Discharge: 2020-01-25 | Disposition: A | Discharge status: Home / Self Care | Payer: Medicaid Other | Source: Ambulatory Visit | Attending: Family Medicine | Admitting: Family Medicine

## 2020-01-25 DIAGNOSIS — N6310 Unspecified lump in the right breast, unspecified quadrant: Secondary | ICD-10-CM

## 2020-01-25 DIAGNOSIS — Z803 Family history of malignant neoplasm of breast: Secondary | ICD-10-CM

## 2020-01-25 DIAGNOSIS — N6341 Unspecified lump in right breast, subareolar: Secondary | ICD-10-CM | POA: Diagnosis not present

## 2020-01-30 ENCOUNTER — Telehealth: Payer: Self-pay | Admitting: Family Medicine

## 2020-01-30 NOTE — Telephone Encounter (Signed)
Multiple attempts to contact patient concerning results of Mammogram.

## 2020-01-31 ENCOUNTER — Ambulatory Visit (INDEPENDENT_AMBULATORY_CARE_PROVIDER_SITE_OTHER): Payer: Medicaid Other | Admitting: Family Medicine

## 2020-01-31 ENCOUNTER — Encounter: Payer: Self-pay | Admitting: Family Medicine

## 2020-01-31 ENCOUNTER — Other Ambulatory Visit: Payer: Self-pay

## 2020-01-31 VITALS — BP 116/81 | HR 89 | Temp 98.6°F | Ht 65.0 in | Wt 154.0 lb

## 2020-01-31 DIAGNOSIS — R0602 Shortness of breath: Secondary | ICD-10-CM

## 2020-01-31 DIAGNOSIS — Z09 Encounter for follow-up examination after completed treatment for conditions other than malignant neoplasm: Secondary | ICD-10-CM

## 2020-01-31 DIAGNOSIS — F32A Depression, unspecified: Secondary | ICD-10-CM

## 2020-01-31 DIAGNOSIS — B370 Candidal stomatitis: Secondary | ICD-10-CM

## 2020-01-31 DIAGNOSIS — K143 Hypertrophy of tongue papillae: Secondary | ICD-10-CM

## 2020-01-31 DIAGNOSIS — F419 Anxiety disorder, unspecified: Secondary | ICD-10-CM | POA: Diagnosis not present

## 2020-01-31 DIAGNOSIS — F329 Major depressive disorder, single episode, unspecified: Secondary | ICD-10-CM

## 2020-01-31 DIAGNOSIS — N6310 Unspecified lump in the right breast, unspecified quadrant: Secondary | ICD-10-CM | POA: Diagnosis not present

## 2020-01-31 DIAGNOSIS — Z803 Family history of malignant neoplasm of breast: Secondary | ICD-10-CM

## 2020-01-31 DIAGNOSIS — Z20828 Contact with and (suspected) exposure to other viral communicable diseases: Secondary | ICD-10-CM
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
## 2020-02-01 ENCOUNTER — Other Ambulatory Visit: Payer: Medicaid Other

## 2020-02-01 DIAGNOSIS — Z20828 Contact with and (suspected) exposure to other viral communicable diseases: Secondary | ICD-10-CM

## 2020-02-01 DIAGNOSIS — K625 Hemorrhage of anus and rectum: Secondary | ICD-10-CM | POA: Diagnosis not present

## 2020-02-01 DIAGNOSIS — R112 Nausea with vomiting, unspecified: Secondary | ICD-10-CM | POA: Diagnosis not present

## 2020-02-01 DIAGNOSIS — R238 Other skin changes: Secondary | ICD-10-CM | POA: Diagnosis not present

## 2020-02-01 DIAGNOSIS — R194 Change in bowel habit: Secondary | ICD-10-CM | POA: Diagnosis not present

## 2020-02-03 LAB — HSV(HERPES SMPLX)ABS-I+II(IGG+IGM)-BLD
HSV 1 Glycoprotein G Ab, IgG: <0.91 index (ref 0.00–0.90)
HSV 2 IgG, Type Spec: 5.6 index — ABNORMAL HIGH (ref 0.00–0.90)
HSVI/II Comb IgM: 1.61 Ratio — ABNORMAL HIGH (ref 0.00–0.90)

## 2020-02-04 ENCOUNTER — Encounter: Payer: Self-pay | Admitting: Family Medicine

## 2020-02-04 DIAGNOSIS — B370 Candidal stomatitis: Secondary | ICD-10-CM | POA: Insufficient documentation

## 2020-02-04 MED ORDER — NYSTATIN 100000 UNIT/ML MT SUSP: 5.0000 mL | Freq: Four times a day (QID) | OROMUCOSAL | 1 refills | Status: DC

## 2020-02-05 ENCOUNTER — Telehealth: Payer: Self-pay | Admitting: Family Medicine

## 2020-02-05 ENCOUNTER — Encounter: Payer: Self-pay | Admitting: Family Medicine

## 2020-02-05 ENCOUNTER — Other Ambulatory Visit: Payer: Self-pay | Admitting: Family Medicine

## 2020-02-05 DIAGNOSIS — B009 Herpesviral infection, unspecified: Secondary | ICD-10-CM

## 2020-02-05 MED ORDER — VALACYCLOVIR HCL 1 G PO TABS: 1000.0000 mg | ORAL_TABLET | Freq: Two times a day (BID) | ORAL | 0 refills | Status: DC

## 2020-02-06 NOTE — Telephone Encounter (Signed)
Attempted to call patient however mailbox is full.

## 2020-02-09 ENCOUNTER — Telehealth: Payer: Self-pay

## 2020-02-09 NOTE — Telephone Encounter (Signed)
Royetta Crochet called:  Per Christinia Gully  called for  her test results. Patient still c/o skin rash and its spreading.   I am not able to reach patient for more details.   Please advise if she call back.

## 2020-02-15 DIAGNOSIS — N926 Irregular menstruation, unspecified: Secondary | ICD-10-CM

## 2020-02-15 HISTORY — DX: Irregular menstruation, unspecified: N92.6

## 2020-02-20 ENCOUNTER — Ambulatory Visit: Payer: Medicaid Other | Attending: Urology | Admitting: Physical Therapy

## 2020-02-20 ENCOUNTER — Encounter: Payer: Self-pay | Admitting: Physical Therapy

## 2020-02-20 ENCOUNTER — Encounter: Payer: Self-pay | Admitting: Family Medicine

## 2020-02-20 ENCOUNTER — Ambulatory Visit (INDEPENDENT_AMBULATORY_CARE_PROVIDER_SITE_OTHER): Payer: Medicaid Other | Admitting: Family Medicine

## 2020-02-20 ENCOUNTER — Other Ambulatory Visit: Payer: Self-pay

## 2020-02-20 VITALS — BP 110/68 | HR 85 | Temp 97.7°F | Ht 65.0 in | Wt 155.2 lb

## 2020-02-20 DIAGNOSIS — R278 Other lack of coordination: Secondary | ICD-10-CM | POA: Diagnosis not present

## 2020-02-20 DIAGNOSIS — E039 Hypothyroidism, unspecified: Secondary | ICD-10-CM | POA: Diagnosis not present

## 2020-02-20 DIAGNOSIS — Z09 Encounter for follow-up examination after completed treatment for conditions other than malignant neoplasm: Secondary | ICD-10-CM

## 2020-02-20 DIAGNOSIS — B370 Candidal stomatitis: Secondary | ICD-10-CM | POA: Diagnosis not present

## 2020-02-20 DIAGNOSIS — R7989 Other specified abnormal findings of blood chemistry: Secondary | ICD-10-CM | POA: Diagnosis not present

## 2020-02-20 DIAGNOSIS — N6310 Unspecified lump in the right breast, unspecified quadrant: Secondary | ICD-10-CM | POA: Diagnosis not present

## 2020-02-20 MED ORDER — METRONIDAZOLE 500 MG PO TABS: 500.0000 mg | ORAL_TABLET | Freq: Two times a day (BID) | ORAL | 0 refills | Status: DC
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
## 2020-02-20 NOTE — Patient Instructions (Signed)
Access Code: [email or web address] URL: [email or web address]: 04/06/2021Prepared by: Loistine Simas BeuhringExercises  Sidelying Diaphragmatic Breathing - 2 x daily - 7 x weekly - 1 sets - 30 reps  Supine Abdominal Wall Massage - 2 x daily - 7 x weekly - 3 sets - 10 reps  Supine Double Knee to Chest - 2 x daily - 7 x weekly - 1 sets - 10 reps - 10 hold  Child's Pose Stretch - 2 x daily - 7 x weekly - 1 sets - 10 reps - 10 hold

## 2020-02-21 ENCOUNTER — Encounter: Payer: Self-pay | Admitting: Family Medicine

## 2020-02-21 LAB — T3, FREE: T3, Free: 2.8 pg/mL (ref 2.0–4.4)

## 2020-02-21 LAB — THYROID PANEL WITH TSH
Free Thyroxine Index: 1.4 (ref 1.2–4.9)
T3 Uptake Ratio: 27 % (ref 24–39)
T4, Total: 5.2 ug/dL (ref 4.5–12.0)
TSH: 0.404 u[IU]/mL — ABNORMAL LOW (ref 0.450–4.500)

## 2020-02-21 LAB — T4, FREE: Free T4: 1.14 ng/dL (ref 0.82–1.77)

## 2020-03-04 ENCOUNTER — Ambulatory Visit: Payer: Medicaid Other | Admitting: Family Medicine

## 2020-03-12 ENCOUNTER — Other Ambulatory Visit: Payer: Self-pay

## 2020-03-12 ENCOUNTER — Encounter: Payer: Self-pay | Admitting: Family Medicine

## 2020-03-12 ENCOUNTER — Ambulatory Visit (INDEPENDENT_AMBULATORY_CARE_PROVIDER_SITE_OTHER): Payer: Medicaid Other | Admitting: Family Medicine

## 2020-03-12 VITALS — BP 112/75 | HR 80 | Temp 98.6°F | Ht 65.0 in | Wt 161.4 lb

## 2020-03-12 DIAGNOSIS — Z09 Encounter for follow-up examination after completed treatment for conditions other than malignant neoplasm: Secondary | ICD-10-CM

## 2020-03-12 DIAGNOSIS — F419 Anxiety disorder, unspecified: Secondary | ICD-10-CM

## 2020-03-12 DIAGNOSIS — E282 Polycystic ovarian syndrome: Secondary | ICD-10-CM | POA: Diagnosis not present

## 2020-03-12 DIAGNOSIS — Z0000 Encounter for general adult medical examination without abnormal findings: Secondary | ICD-10-CM

## 2020-03-12 DIAGNOSIS — N926 Irregular menstruation, unspecified: Secondary | ICD-10-CM | POA: Diagnosis not present

## 2020-03-12 DIAGNOSIS — F329 Major depressive disorder, single episode, unspecified: Secondary | ICD-10-CM

## 2020-03-12 LAB — POCT URINALYSIS DIPSTICK
Bilirubin, UA: NEGATIVE
Blood, UA: NEGATIVE
Glucose, UA: NEGATIVE
Ketones, UA: NEGATIVE
Leukocytes, UA: NEGATIVE
Nitrite, UA: NEGATIVE
Protein, UA: NEGATIVE
Spec Grav, UA: >=1.03 — AB (ref 1.010–1.025)
Urobilinogen, UA: 0.2 E.U./dL
pH, UA: 7 (ref 5.0–8.0)
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
## 2020-03-14 ENCOUNTER — Other Ambulatory Visit: Payer: Self-pay

## 2020-03-14 ENCOUNTER — Encounter: Payer: Self-pay | Admitting: Physical Therapy

## 2020-03-14 ENCOUNTER — Ambulatory Visit: Payer: Medicaid Other | Admitting: Physical Therapy

## 2020-03-14 DIAGNOSIS — R278 Other lack of coordination: Secondary | ICD-10-CM

## 2020-03-14 NOTE — Patient Instructions (Signed)
Access Code: [email or web address] URL: [email or web address]: 04/29/2021Prepared by: Loistine Simas BeuhringExercises  Sidelying Diaphragmatic Breathing - 2 x daily - 7 x weekly - 1 sets - 30 reps  Supine Abdominal Wall Massage - 2 x daily - 7 x weekly - 3 sets - 10 reps  Supine Double Knee to Chest - 2 x daily - 7 x weekly - 1 sets - 10 reps - 10 hold  Child's Pose Stretch - 2 x daily - 7 x weekly - 1 sets - 10 reps - 10 hold  Supine Butterfly Groin Stretch - 1 x daily - 7 x weekly - 3 sets - 10 reps - 60 hold  Supine Pelvic Floor Stretch - 2 x daily - 7 x weekly - 3 sets - 1 reps - 30 hold
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
## 2020-03-19 ENCOUNTER — Ambulatory Visit: Payer: Medicaid Other | Admitting: Physical Therapy

## 2020-03-26 ENCOUNTER — Encounter: Payer: Self-pay | Admitting: Physical Therapy

## 2020-03-26 ENCOUNTER — Ambulatory Visit: Payer: Medicaid Other | Attending: Urology | Admitting: Physical Therapy

## 2020-03-26 ENCOUNTER — Other Ambulatory Visit: Payer: Self-pay

## 2020-03-26 DIAGNOSIS — R278 Other lack of coordination: Secondary | ICD-10-CM | POA: Diagnosis not present
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
## 2020-03-26 NOTE — Patient Instructions (Signed)
Toileting Techniques for Bowel Movements (Defecation) Using your belly (abdomen) and pelvic floor muscles to have a bowel movement is usually instinctive.  Sometimes people can have problems with these muscles and have to relearn proper defecation (emptying) techniques.  If you have weakness in your muscles, organs that are falling out, decreased sensation in your pelvis, or ignore your urge to go, you may find yourself straining to have a bowel movement.  You are straining if you are: . holding your breath or taking in a huge gulp of air and holding it  . keeping your lips and jaw tensed and closed tightly . turning red in the face because of excessive pushing or forcing . developing or worsening your  hemorrhoids . getting faint while pushing . not emptying completely and have to defecate many times a day  If you are straining, you are actually making it harder for yourself to have a bowel movement.  Many people find they are pulling up with the pelvic floor muscles and closing off instead of opening the anus. Due to lack pelvic floor relaxation and coordination the abdominal muscles, one has to work harder to push the feces out.  Many people have never been taught how to defecate efficiently and effectively.  Notice what happens to your body when you are having a bowel movement.  While you are sitting on the toilet pay attention to the following areas: . Jaw and mouth position . Angle of your hips   . Whether your feet touch the ground or not . Arm placement  . Spine position . Waist . Belly tension . Anus (opening of the anal canal)  An Evacuation/Defecation Plan   Here are the 4 basic points:  1. Lean forward enough for your elbows to rest on your knees 2. Support your feet on the floor or use a low stool if your feet don't touch the floor  3. Push out your belly as if you have swallowed a beach ball--you should feel a widening of your waist 4. Open and relax your pelvic floor  muscles, rather than tightening around the anus       The following conditions my require modifications to your toileting posture:  . If you have had surgery in the past that limits your back, hip, pelvic, knee or ankle flexibility . Constipation   Your healthcare practitioner may make the following additional suggestions and adjustments:  1) Sit on the toilet  a) Make sure your feet are supported. b) Notice your hip angle and spine position--most people find it effective to lean forward or raise their knees, which can help the muscles around the anus to relax  c) When you lean forward, place your forearms on your thighs for support  2) Relax suggestions a) Breath deeply in through your nose and out slowly through your mouth as if you are smelling the flowers and blowing out the candles. b) To become aware of how to relax your muscles, contracting and releasing muscles can be helpful.  Pull your pelvic floor muscles in tightly by using the image of holding back gas, or closing around the anus (visualize making a circle smaller) and lifting the anus up and in.  Then release the muscles and your anus should drop down and feel open. Repeat 5 times ending with the feeling of relaxation. c) Keep your pelvic floor muscles relaxed; let your belly bulge out. d) The digestive tract starts at the mouth and ends at the anal opening, so   be sure to relax both ends of the tube.  Place your tongue on the roof of your mouth with your teeth separated.  This helps relax your mouth and will help to relax the anus at the same time.  3) Empty (defecation) a) Keep your pelvic floor and sphincter relaxed, then bulge your anal muscles.  Make the anal opening wide.  b) Stick your belly out as if you have swallowed a beach ball. c) Make your belly wall hard using your belly muscles while continuing to breathe. Doing this makes it easier to open your anus. d) Breath out and give a grunt (or try using other sounds  such as ahhhh, shhhhh, ohhhh or grrrrrrr).  4) Finish a) As you finish your bowel movement, pull the pelvic floor muscles up and in.  This will leave your anus in the proper place rather than remaining pushed out and down. If you leave your anus pushed out and down, it will start to feel as though that is normal and give you incorrect signals about needing to have a bowel movement.  

## 2020-04-02 ENCOUNTER — Ambulatory Visit: Payer: Medicaid Other | Admitting: Physical Therapy

## 2020-04-02 ENCOUNTER — Telehealth: Payer: Self-pay | Admitting: Physical Therapy

## 2020-04-02 NOTE — Telephone Encounter (Signed)
PT tried to call Pt regarding missed PT appointment today 04/02/20 at 3:30pm.  No answer and unable to leave a voicemail due to mailbox being full.  Aphrodite Harpenau, PT 04/02/20 4:02 PM

## 2020-04-03 ENCOUNTER — Ambulatory Visit: Payer: Medicaid Other | Admitting: Family Medicine

## 2020-04-09 ENCOUNTER — Ambulatory Visit: Payer: Medicaid Other | Admitting: Physical Therapy

## 2020-04-09 ENCOUNTER — Telehealth: Payer: Self-pay | Admitting: Physical Therapy

## 2020-04-09 NOTE — Telephone Encounter (Signed)
Pt was a No Show for 2nd PT visit in a row.  Appointment today was scheduled for 04/09/20 at 3:30pm.  PT left Pt a voicemail to call the PT office before attending her next visit.  Sharna Gabrys, PT 04/09/20 3:51 PM

## 2020-04-18 ENCOUNTER — Ambulatory Visit: Payer: Medicaid Other | Attending: Urology | Admitting: Physical Therapy

## 2020-04-18 ENCOUNTER — Encounter: Payer: Self-pay | Admitting: Physical Therapy

## 2020-04-18 ENCOUNTER — Other Ambulatory Visit: Payer: Self-pay

## 2020-04-18 DIAGNOSIS — R278 Other lack of coordination: Secondary | ICD-10-CM | POA: Insufficient documentation
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
## 2020-04-18 NOTE — Patient Instructions (Addendum)
Normal urination is 5-7 times a day.    Increase fluids (try to avoid alcohol, carbonation, and caffeine since they are bladder irritants).  Ideally we drink 8 glasses of water a day.    Aim to sit on the toilet every 2 hours to void.  When you pee, count [place] - goal is 8 [place]is   Please follow up with orders for colonoscopy and with [facility] about urination (low frequency and amount).

## 2020-05-02 ENCOUNTER — Telehealth: Payer: Self-pay | Admitting: Physical Therapy

## 2020-05-02 ENCOUNTER — Encounter: Payer: Self-pay | Admitting: Physical Therapy

## 2020-05-02 ENCOUNTER — Ambulatory Visit: Payer: Medicaid Other | Admitting: Physical Therapy

## 2020-05-02 NOTE — Telephone Encounter (Signed)
Pt no showed for PT on 05/02/20 at 3:30pm.  PT attempted to contact Pt but no answer and was unable to leave a voicemail due to full mailbox.   Ladavia Lindenbaum, PT 05/02/20 3:51 PM

## 2020-05-09 ENCOUNTER — Encounter: Payer: Medicaid Other | Admitting: Physical Therapy

## 2020-06-11 ENCOUNTER — Ambulatory Visit: Payer: Medicaid Other | Admitting: Family Medicine

## 2020-07-02 ENCOUNTER — Emergency Department (HOSPITAL_COMMUNITY)
Admission: EM | Admit: 2020-07-02 | Discharge: 2020-07-02 | Disposition: A | Discharge status: Home / Self Care | Payer: Medicaid Other | Attending: Emergency Medicine | Admitting: Emergency Medicine

## 2020-07-02 DIAGNOSIS — D559 Anemia due to enzyme disorder, unspecified: Secondary | ICD-10-CM | POA: Insufficient documentation

## 2020-07-02 DIAGNOSIS — Z5321 Procedure and treatment not carried out due to patient leaving prior to being seen by health care provider: Secondary | ICD-10-CM | POA: Insufficient documentation

## 2020-07-02 NOTE — ED Notes (Signed)
Patient seen leaving triage room without being triaged

## 2020-07-02 NOTE — ED Notes (Signed)
Patient brought back into triage room and attempted to triage again. Patient belligerent and yelling at staff. Security and [employer] in triage where patient continues to yell. Patient states she wants to sit quietly in chair and let us work. I informed patient I cannot triage her unless I know why she is here.

## 2020-07-03 ENCOUNTER — Other Ambulatory Visit: Payer: Self-pay

## 2020-07-03 ENCOUNTER — Encounter (HOSPITAL_COMMUNITY): Payer: Self-pay

## 2020-07-03 ENCOUNTER — Emergency Department (HOSPITAL_COMMUNITY): Payer: Medicaid Other

## 2020-07-03 ENCOUNTER — Emergency Department (HOSPITAL_COMMUNITY)
Admission: EM | Admit: 2020-07-03 | Discharge: 2020-07-03 | Disposition: A | Discharge status: Home / Self Care | Payer: Medicaid Other | Attending: Emergency Medicine | Admitting: Emergency Medicine

## 2020-07-03 ENCOUNTER — Telehealth: Payer: Self-pay | Admitting: *Deleted

## 2020-07-03 DIAGNOSIS — R61 Generalized hyperhidrosis: Secondary | ICD-10-CM | POA: Insufficient documentation

## 2020-07-03 DIAGNOSIS — R093 Abnormal sputum: Secondary | ICD-10-CM | POA: Insufficient documentation

## 2020-07-03 DIAGNOSIS — Z5321 Procedure and treatment not carried out due to patient leaving prior to being seen by health care provider: Secondary | ICD-10-CM | POA: Insufficient documentation

## 2020-07-03 DIAGNOSIS — Z202 Contact with and (suspected) exposure to infections with a predominantly sexual mode of transmission: Secondary | ICD-10-CM | POA: Diagnosis not present

## 2020-07-03 DIAGNOSIS — R002 Palpitations: Secondary | ICD-10-CM | POA: Insufficient documentation

## 2020-07-03 LAB — I-STAT BETA HCG BLOOD, ED (MC, WL, AP ONLY): I-stat hCG, quantitative: <5 m[IU]/mL (ref <5)

## 2020-07-03 LAB — BASIC METABOLIC PANEL
Anion gap: 10 (ref 5–15)
BUN: 9 mg/dL (ref 6–20)
CO2: 23 mmol/L (ref 22–32)
Calcium: 9.1 mg/dL (ref 8.9–10.3)
Chloride: 106 mmol/L (ref 98–111)
Creatinine, Ser: 0.74 mg/dL (ref 0.44–1.00)
GFR calc Af Amer: >60 mL/min (ref >60)
GFR calc non Af Amer: >60 mL/min (ref >60)
Glucose, Bld: 96 mg/dL (ref 70–99)
Potassium: 3.6 mmol/L (ref 3.5–5.1)
Sodium: 139 mmol/L (ref 135–145)

## 2020-07-03 LAB — CBC
HCT: 42.9 % (ref 36.0–46.0)
Hemoglobin: 13.9 g/dL (ref 12.0–15.0)
MCH: 32.5 pg (ref 26.0–34.0)
MCHC: 32.4 g/dL (ref 30.0–36.0)
MCV: 100.2 fL — ABNORMAL HIGH (ref 80.0–100.0)
Platelets: 308 10*3/uL (ref 150–400)
RBC: 4.28 MIL/uL (ref 3.87–5.11)
RDW: 12.1 % (ref 11.5–15.5)
WBC: 10.7 10*3/uL — ABNORMAL HIGH (ref 4.0–10.5)
nRBC: 0 % (ref 0.0–0.2)

## 2020-07-03 LAB — TROPONIN I (HIGH SENSITIVITY)
Troponin I (High Sensitivity): <2 ng/L (ref <18)
Troponin I (High Sensitivity): <2 ng/L (ref <18)

## 2020-07-03 NOTE — ED Triage Notes (Signed)
Patient arrived stating over the last few years she has had palpations. Reports her heart rate will go to 130-140s at times and her hands get sweaty, but then resolves. Patient also states she smokes and sometimes coughs up brown phlegm. Also requesting an STD check while here.

## 2020-07-03 NOTE — Telephone Encounter (Signed)
Kellie Deleon presented to the ED and left before being seen by the provider on 07/02/20. The patient has been enrolled in an automated general discharge outreach program and 2 attempts to contact the patient will be made to follow up on their ED visit and subsequent needs. The care management team is available to provide assistance to this patient at any time.   Burnard Bunting, RN, BSN, CCRN Patient Engagement Center [phone]

## 2020-07-03 NOTE — ED Notes (Signed)
Contacted lab regarding labs not resulted. Reports they will run labs at this time. Additional light green tube sent to lab.

## 2020-07-04 ENCOUNTER — Telehealth: Payer: Self-pay | Admitting: *Deleted

## 2020-07-04 NOTE — Telephone Encounter (Signed)
Elopement:  Kellie Deleon presented to the ED and left before being seen by the provider on 06/16/20. The patient has been enrolled in an automated general discharge outreach program and 2 attempts to contact the patient will be made to follow up on their ED visit and subsequent needs. The care management team is available to provide assistance to this patient at any time.   Burnard Bunting, RN, BSN, CCRN Patient Engagement Center [phone]

## 2020-07-08 DIAGNOSIS — N760 Acute vaginitis: Secondary | ICD-10-CM | POA: Diagnosis not present

## 2020-07-08 DIAGNOSIS — N898 Other specified noninflammatory disorders of vagina: Secondary | ICD-10-CM | POA: Diagnosis not present

## 2020-07-08 DIAGNOSIS — R002 Palpitations: Secondary | ICD-10-CM | POA: Diagnosis not present

## 2020-07-08 DIAGNOSIS — R000 Tachycardia, unspecified: Secondary | ICD-10-CM | POA: Diagnosis not present

## 2020-07-09 ENCOUNTER — Telehealth: Payer: Self-pay | Admitting: *Deleted

## 2020-07-09 NOTE — Telephone Encounter (Signed)
.  Call received from [facility] in response to [facility] general discharge transition call. States she will follow up with her pcp for her emergency visit.                                      Junnie Loschiavo                                        PEC                                  [phone]

## 2020-07-12 ENCOUNTER — Telehealth: Payer: Self-pay | Admitting: Family Medicine

## 2020-07-12 NOTE — Telephone Encounter (Signed)
Pt was called several times no message was left no room on VM

## 2020-07-30 ENCOUNTER — Other Ambulatory Visit: Payer: Medicaid Other
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
## 2020-10-01 ENCOUNTER — Other Ambulatory Visit: Payer: Self-pay

## 2020-10-01 ENCOUNTER — Encounter (HOSPITAL_COMMUNITY): Payer: Self-pay

## 2020-10-01 ENCOUNTER — Ambulatory Visit (HOSPITAL_COMMUNITY)
Admission: EM | Admit: 2020-10-01 | Discharge: 2020-10-01 | Disposition: A | Discharge status: Home / Self Care | Payer: Medicaid Other | Attending: Family Medicine | Admitting: Family Medicine

## 2020-10-01 DIAGNOSIS — R21 Rash and other nonspecific skin eruption: Secondary | ICD-10-CM | POA: Insufficient documentation

## 2020-10-01 DIAGNOSIS — Z3202 Encounter for pregnancy test, result negative: Secondary | ICD-10-CM

## 2020-10-01 DIAGNOSIS — Z113 Encounter for screening for infections with a predominantly sexual mode of transmission: Secondary | ICD-10-CM | POA: Insufficient documentation

## 2020-10-01 DIAGNOSIS — N760 Acute vaginitis: Secondary | ICD-10-CM

## 2020-10-01 LAB — POCT URINALYSIS DIPSTICK, ED / UC
Bilirubin Urine: NEGATIVE
Glucose, UA: NEGATIVE mg/dL
Ketones, ur: NEGATIVE mg/dL
Leukocytes,Ua: NEGATIVE
Nitrite: NEGATIVE
Protein, ur: NEGATIVE mg/dL
Specific Gravity, Urine: >=1.03 (ref 1.005–1.030)
Urobilinogen, UA: 0.2 mg/dL (ref 0.0–1.0)
pH: 6.5 (ref 5.0–8.0)

## 2020-10-01 LAB — POC URINE PREG, ED: Preg Test, Ur: NEGATIVE

## 2020-10-01 MED ORDER — TRIAMCINOLONE ACETONIDE 0.1 % EX CREA
1.0000 | TOPICAL_CREAM | Freq: Two times a day (BID) | CUTANEOUS | 0 refills | Status: DC | PRN
Start: 2020-10-01 — End: 2022-04-17

## 2020-10-01 NOTE — ED Triage Notes (Signed)
Pt presents with vaginal odor and discharge X 1 week. 
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
## 2020-10-02 LAB — CERVICOVAGINAL ANCILLARY ONLY
Bacterial Vaginitis (gardnerella): NEGATIVE
Candida Glabrata: NEGATIVE
Candida Vaginitis: NEGATIVE
Chlamydia: NEGATIVE
Comment: NEGATIVE
Comment: NEGATIVE
Comment: NEGATIVE
Comment: NEGATIVE
Comment: NEGATIVE
Comment: NORMAL
Neisseria Gonorrhea: NEGATIVE
Trichomonas: NEGATIVE

## 2020-10-02 LAB — HIV ANTIBODY (ROUTINE TESTING W REFLEX): HIV Screen 4th Generation wRfx: NONREACTIVE

## 2020-10-02 LAB — RPR: RPR Ser Ql: NONREACTIVE

## 2020-10-14 ENCOUNTER — Encounter: Payer: Self-pay | Admitting: Family Medicine

## 2020-10-14 ENCOUNTER — Other Ambulatory Visit: Payer: Self-pay

## 2020-10-14 ENCOUNTER — Ambulatory Visit (INDEPENDENT_AMBULATORY_CARE_PROVIDER_SITE_OTHER): Payer: Medicaid Other | Admitting: Family Medicine

## 2020-10-14 VITALS — BP 132/72 | HR 88 | Temp 98.7°F | Resp 16 | Ht 65.0 in | Wt 171.6 lb

## 2020-10-14 DIAGNOSIS — R5383 Other fatigue: Secondary | ICD-10-CM

## 2020-10-14 DIAGNOSIS — R0602 Shortness of breath: Secondary | ICD-10-CM

## 2020-10-14 DIAGNOSIS — Z09 Encounter for follow-up examination after completed treatment for conditions other than malignant neoplasm: Secondary | ICD-10-CM | POA: Diagnosis not present

## 2020-10-14 DIAGNOSIS — F419 Anxiety disorder, unspecified: Secondary | ICD-10-CM

## 2020-10-14 DIAGNOSIS — Z23 Encounter for immunization: Secondary | ICD-10-CM | POA: Diagnosis not present

## 2020-10-14 DIAGNOSIS — F32A Depression, unspecified: Secondary | ICD-10-CM

## 2020-10-14 DIAGNOSIS — R519 Headache, unspecified: Secondary | ICD-10-CM | POA: Diagnosis not present

## 2020-10-14 DIAGNOSIS — R002 Palpitations: Secondary | ICD-10-CM | POA: Diagnosis not present

## 2020-10-14 DIAGNOSIS — E0590 Thyrotoxicosis, unspecified without thyrotoxic crisis or storm: Secondary | ICD-10-CM

## 2020-10-14 DIAGNOSIS — E282 Polycystic ovarian syndrome: Secondary | ICD-10-CM | POA: Diagnosis not present

## 2020-10-14 DIAGNOSIS — Z0000 Encounter for general adult medical examination without abnormal findings: Secondary | ICD-10-CM | POA: Diagnosis not present
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
## 2020-10-15 LAB — CBC WITH DIFFERENTIAL/PLATELET
Basophils Absolute: 0.1 10*3/uL (ref 0.0–0.2)
Basos: 1 %
EOS (ABSOLUTE): 0.1 10*3/uL (ref 0.0–0.4)
Eos: 2 %
Hematocrit: 41.7 % (ref 34.0–46.6)
Hemoglobin: 14.5 g/dL (ref 11.1–15.9)
Immature Grans (Abs): 0 10*3/uL (ref 0.0–0.1)
Immature Granulocytes: 0 %
Lymphocytes Absolute: 2.2 10*3/uL (ref 0.7–3.1)
Lymphs: 28 %
MCH: 33.5 pg — ABNORMAL HIGH (ref 26.6–33.0)
MCHC: 34.8 g/dL (ref 31.5–35.7)
MCV: 96 fL (ref 79–97)
Monocytes Absolute: 0.6 10*3/uL (ref 0.1–0.9)
Monocytes: 8 %
Neutrophils Absolute: 4.7 10*3/uL (ref 1.4–7.0)
Neutrophils: 61 %
Platelets: 295 10*3/uL (ref 150–450)
RBC: 4.33 x10E6/uL (ref 3.77–5.28)
RDW: 11.4 % — ABNORMAL LOW (ref 11.7–15.4)
WBC: 7.6 10*3/uL (ref 3.4–10.8)

## 2020-10-15 LAB — VITAMIN B12: Vitamin B-12: 618 pg/mL (ref 232–1245)

## 2020-10-15 LAB — COMPREHENSIVE METABOLIC PANEL
ALT: 12 IU/L (ref 0–32)
AST: 18 IU/L (ref 0–40)
Albumin/Globulin Ratio: 1.8 (ref 1.2–2.2)
Albumin: 4.6 g/dL (ref 3.9–5.0)
Alkaline Phosphatase: 69 IU/L (ref 44–121)
BUN/Creatinine Ratio: 17 (ref 9–23)
BUN: 14 mg/dL (ref 6–20)
Bilirubin Total: 0.2 mg/dL (ref 0.0–1.2)
CO2: 23 mmol/L (ref 20–29)
Calcium: 9.4 mg/dL (ref 8.7–10.2)
Chloride: 103 mmol/L (ref 96–106)
Creatinine, Ser: 0.81 mg/dL (ref 0.57–1.00)
GFR calc Af Amer: 113 mL/min/{1.73_m2} (ref >59)
GFR calc non Af Amer: 98 mL/min/{1.73_m2} (ref >59)
Globulin, Total: 2.5 g/dL (ref 1.5–4.5)
Glucose: 80 mg/dL (ref 65–99)
Potassium: 4.5 mmol/L (ref 3.5–5.2)
Sodium: 138 mmol/L (ref 134–144)
Total Protein: 7.1 g/dL (ref 6.0–8.5)

## 2020-10-15 LAB — VITAMIN D 25 HYDROXY (VIT D DEFICIENCY, FRACTURES): Vit D, 25-Hydroxy: 17.5 ng/mL — ABNORMAL LOW (ref 30.0–100.0)

## 2020-10-15 LAB — THYROID PANEL WITH TSH
Free Thyroxine Index: 1.3 (ref 1.2–4.9)
T3 Uptake Ratio: 26 % (ref 24–39)
T4, Total: 5.1 ug/dL (ref 4.5–12.0)
TSH: 0.894 u[IU]/mL (ref 0.450–4.500)

## 2020-10-15 LAB — T3, FREE: T3, Free: 2.9 pg/mL (ref 2.0–4.4)

## 2020-10-15 LAB — T4, FREE: Free T4: 1.17 ng/dL (ref 0.82–1.77)

## 2020-10-28 ENCOUNTER — Telehealth: Payer: Self-pay | Admitting: Family Medicine

## 2020-10-29 ENCOUNTER — Other Ambulatory Visit: Payer: Self-pay | Admitting: Family Medicine

## 2020-10-29 DIAGNOSIS — R0602 Shortness of breath: Secondary | ICD-10-CM

## 2020-10-29 DIAGNOSIS — R102 Pelvic and perineal pain unspecified side: Secondary | ICD-10-CM

## 2020-10-29 DIAGNOSIS — E559 Vitamin D deficiency, unspecified: Secondary | ICD-10-CM

## 2020-10-29 DIAGNOSIS — R7989 Other specified abnormal findings of blood chemistry: Secondary | ICD-10-CM

## 2020-10-29 MED ORDER — NORGESTIM-ETH ESTRAD TRIPHASIC 0.18/0.215/0.25 MG-25 MCG PO TABS: 1.0000 | ORAL_TABLET | Freq: Every day | ORAL | 3 refills | Status: DC

## 2020-10-29 MED ORDER — ALBUTEROL SULFATE HFA 108 (90 BASE) MCG/ACT IN AERS: 2.0000 | INHALATION_SPRAY | Freq: Four times a day (QID) | RESPIRATORY_TRACT | 3 refills | Status: DC | PRN

## 2020-10-29 MED ORDER — VITAMIN D (ERGOCALCIFEROL) 1.25 MG (50000 UNIT) PO CAPS: 50000.0000 [IU] | ORAL_CAPSULE | ORAL | 3 refills | Status: DC

## 2020-10-29 NOTE — Telephone Encounter (Signed)
Sent to provider 

## 2020-11-06 DIAGNOSIS — K589 Irritable bowel syndrome without diarrhea: Secondary | ICD-10-CM | POA: Diagnosis not present

## 2020-11-06 DIAGNOSIS — R002 Palpitations: Secondary | ICD-10-CM | POA: Diagnosis not present

## 2020-11-06 DIAGNOSIS — L659 Nonscarring hair loss, unspecified: Secondary | ICD-10-CM | POA: Diagnosis not present

## 2020-11-06 DIAGNOSIS — R946 Abnormal results of thyroid function studies: Secondary | ICD-10-CM | POA: Diagnosis not present

## 2020-11-27 ENCOUNTER — Institutional Professional Consult (permissible substitution): Payer: Medicaid Other | Admitting: Pulmonary Disease

## 2020-12-12 ENCOUNTER — Other Ambulatory Visit: Payer: Medicaid Other

## 2020-12-19 ENCOUNTER — Institutional Professional Consult (permissible substitution): Payer: Medicaid Other | Admitting: Pulmonary Disease

## 2021-01-03 ENCOUNTER — Institutional Professional Consult (permissible substitution): Payer: Medicaid Other | Admitting: Pulmonary Disease

## 2021-01-09 DIAGNOSIS — E282 Polycystic ovarian syndrome: Secondary | ICD-10-CM | POA: Diagnosis not present

## 2021-01-09 DIAGNOSIS — Z23 Encounter for immunization: Secondary | ICD-10-CM | POA: Diagnosis not present

## 2021-01-09 DIAGNOSIS — H00015 Hordeolum externum left lower eyelid: Secondary | ICD-10-CM | POA: Diagnosis not present

## 2021-01-09 DIAGNOSIS — E0590 Thyrotoxicosis, unspecified without thyrotoxic crisis or storm: Secondary | ICD-10-CM | POA: Diagnosis not present

## 2021-01-13 ENCOUNTER — Ambulatory Visit: Payer: Medicaid Other | Admitting: Family Medicine

## 2021-01-14 ENCOUNTER — Institutional Professional Consult (permissible substitution): Payer: Medicaid Other | Admitting: Pulmonary Disease

## 2021-01-15 ENCOUNTER — Inpatient Hospital Stay: Admission: RE | Admit: 2021-01-15 | Payer: Medicaid Other | Source: Ambulatory Visit

## 2021-04-18 DIAGNOSIS — A609 Anogenital herpesviral infection, unspecified: Secondary | ICD-10-CM | POA: Diagnosis not present

## 2021-04-18 DIAGNOSIS — R21 Rash and other nonspecific skin eruption: Secondary | ICD-10-CM | POA: Diagnosis not present

## 2021-05-23 DIAGNOSIS — A609 Anogenital herpesviral infection, unspecified: Secondary | ICD-10-CM | POA: Diagnosis not present

## 2021-05-23 DIAGNOSIS — Z7689 Persons encountering health services in other specified circumstances: Secondary | ICD-10-CM | POA: Diagnosis not present

## 2021-05-23 DIAGNOSIS — R030 Elevated blood-pressure reading, without diagnosis of hypertension: Secondary | ICD-10-CM | POA: Diagnosis not present

## 2021-05-23 DIAGNOSIS — Z566 Other physical and mental strain related to work: Secondary | ICD-10-CM | POA: Diagnosis not present

## 2021-05-23 DIAGNOSIS — F439 Reaction to severe stress, unspecified: Secondary | ICD-10-CM | POA: Diagnosis not present

## 2021-05-23 DIAGNOSIS — R21 Rash and other nonspecific skin eruption: Secondary | ICD-10-CM | POA: Diagnosis not present

## 2021-06-04 DIAGNOSIS — R110 Nausea: Secondary | ICD-10-CM | POA: Diagnosis not present

## 2021-06-04 DIAGNOSIS — K219 Gastro-esophageal reflux disease without esophagitis: Secondary | ICD-10-CM | POA: Diagnosis not present

## 2021-06-04 DIAGNOSIS — R194 Change in bowel habit: Secondary | ICD-10-CM | POA: Diagnosis not present

## 2021-06-04 DIAGNOSIS — K625 Hemorrhage of anus and rectum: Secondary | ICD-10-CM | POA: Diagnosis not present

## 2021-06-04 DIAGNOSIS — R140 Abdominal distension (gaseous): Secondary | ICD-10-CM | POA: Diagnosis not present

## 2021-06-11 DIAGNOSIS — N898 Other specified noninflammatory disorders of vagina: Secondary | ICD-10-CM | POA: Diagnosis not present

## 2021-07-15 DIAGNOSIS — I10 Essential (primary) hypertension: Secondary | ICD-10-CM | POA: Diagnosis not present

## 2021-07-15 DIAGNOSIS — N6310 Unspecified lump in the right breast, unspecified quadrant: Secondary | ICD-10-CM | POA: Diagnosis not present

## 2021-07-15 DIAGNOSIS — R002 Palpitations: Secondary | ICD-10-CM | POA: Diagnosis not present

## 2021-07-15 DIAGNOSIS — E282 Polycystic ovarian syndrome: Secondary | ICD-10-CM | POA: Diagnosis not present

## 2021-10-07 ENCOUNTER — Other Ambulatory Visit: Payer: Self-pay | Admitting: Family Medicine

## 2021-10-07 DIAGNOSIS — N6310 Unspecified lump in the right breast, unspecified quadrant: Secondary | ICD-10-CM

## 2022-04-17 ENCOUNTER — Ambulatory Visit (HOSPITAL_COMMUNITY)
Admission: EM | Admit: 2022-04-17 | Discharge: 2022-04-17 | Disposition: A | Discharge status: Home / Self Care | Payer: Medicaid Other

## 2022-04-17 ENCOUNTER — Encounter (HOSPITAL_COMMUNITY): Payer: Self-pay | Admitting: Emergency Medicine

## 2022-04-17 DIAGNOSIS — J069 Acute upper respiratory infection, unspecified: Secondary | ICD-10-CM

## 2022-04-17 NOTE — Discharge Instructions (Addendum)
I recommend taking a daily allergy medicine for your congestion.  You can alternate Tylenol and ibuprofen for throat pain.  Continue your throat lozenges.  You can also try warm salt water gargles.  Please return to the urgent care or emergency department if symptoms worsen or do not improve.

## 2022-04-17 NOTE — ED Triage Notes (Signed)
Patient c/o sore throat and fatigue x 1 day.   Patient endorses scratchiness in throat.   Patient endorses episodes of " hot flashes".   Patient has used lozenges with no relief of symptoms.    Patient c/o RT index finger pain x 4 months.   Patient states onset of symptoms began " after working in the freezer".   Patient endorses symptoms worsen in the morning. Patient has difficulty with movement of the finger in the morning, patient states " it gets stuck and it hurt to move it".
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
## 2022-06-29 ENCOUNTER — Ambulatory Visit (HOSPITAL_COMMUNITY)
Admission: EM | Admit: 2022-06-29 | Discharge: 2022-06-29 | Disposition: A | Discharge status: Home / Self Care | Payer: Medicaid Other | Attending: Internal Medicine | Admitting: Internal Medicine

## 2022-06-29 ENCOUNTER — Ambulatory Visit (INDEPENDENT_AMBULATORY_CARE_PROVIDER_SITE_OTHER): Payer: Medicaid Other

## 2022-06-29 ENCOUNTER — Encounter (HOSPITAL_COMMUNITY): Payer: Self-pay | Admitting: Emergency Medicine

## 2022-06-29 DIAGNOSIS — M24541 Contracture, right hand: Secondary | ICD-10-CM | POA: Diagnosis not present

## 2022-06-29 DIAGNOSIS — M21241 Flexion deformity, right finger joints: Secondary | ICD-10-CM | POA: Diagnosis not present

## 2022-06-29 DIAGNOSIS — M659 Synovitis and tenosynovitis, unspecified: Secondary | ICD-10-CM

## 2022-06-29 DIAGNOSIS — M79644 Pain in right finger(s): Secondary | ICD-10-CM | POA: Diagnosis not present

## 2022-06-29 MED ORDER — PREDNISONE 20 MG PO TABS: 40.0000 mg | ORAL_TABLET | Freq: Every day | ORAL | 0 refills | Status: AC

## 2022-06-29 MED ORDER — METHYLPREDNISOLONE SODIUM SUCC 125 MG IJ SOLR
INTRAMUSCULAR | Status: AC
  Filled 2022-06-29: qty 2

## 2022-06-29 MED ORDER — METHYLPREDNISOLONE SODIUM SUCC 125 MG IJ SOLR
60.0000 mg | Freq: Once | INTRAMUSCULAR | Status: AC
Start: 2022-06-29 — End: 2022-06-29
  Administered 2022-06-29: 60 mg via INTRAMUSCULAR

## 2022-06-29 MED ORDER — CYCLOBENZAPRINE HCL 10 MG PO TABS: 10.0000 mg | ORAL_TABLET | Freq: Every day | ORAL | 0 refills | Status: AC

## 2022-06-29 NOTE — Discharge Instructions (Addendum)
Symptoms are most likely being caused by injury to your tendon within your finger that allows for movement  We will begin medications to reduce inflammation to help calm your symptoms  You have been given an injection of Toradol in office to help kick start this process  Starting tomorrow take prednisone every morning with food for 5 days, you may use Tylenol 500 to 1000 mg every 6 hours for additional comfort  You may use muscle relaxer at bedtime for additional comfort, be mindful this medication may make you drowsy  May use ice or heat over the affected area in 10 to 15-minute intervals  Do not attempt to forcefully straighten finger as this may worsen injury and cause more damage  You have been given information to to orthopedic offices, please schedule an appointment with 1 for soon as possible for reevaluation

## 2022-06-29 NOTE — ED Triage Notes (Signed)
Intermittent right ring finger contracture that patient has been "popping" back into place until she tried it last night - then had a lot of pain, now it will not relax.
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
## 2022-12-08 DIAGNOSIS — L819 Disorder of pigmentation, unspecified: Secondary | ICD-10-CM | POA: Diagnosis not present

## 2022-12-08 DIAGNOSIS — A609 Anogenital herpesviral infection, unspecified: Secondary | ICD-10-CM | POA: Diagnosis not present

## 2022-12-08 DIAGNOSIS — N898 Other specified noninflammatory disorders of vagina: Secondary | ICD-10-CM | POA: Diagnosis not present

## 2022-12-08 DIAGNOSIS — Z0000 Encounter for general adult medical examination without abnormal findings: Secondary | ICD-10-CM | POA: Diagnosis not present

## 2023-01-25 DIAGNOSIS — Z124 Encounter for screening for malignant neoplasm of cervix: Secondary | ICD-10-CM | POA: Diagnosis not present

## 2023-01-25 DIAGNOSIS — B009 Herpesviral infection, unspecified: Secondary | ICD-10-CM | POA: Diagnosis not present

## 2023-01-25 DIAGNOSIS — R519 Headache, unspecified: Secondary | ICD-10-CM | POA: Diagnosis not present

## 2023-02-19 DIAGNOSIS — R058 Other specified cough: Secondary | ICD-10-CM | POA: Diagnosis not present

## 2023-02-19 DIAGNOSIS — B009 Herpesviral infection, unspecified: Secondary | ICD-10-CM | POA: Diagnosis not present

## 2023-02-19 DIAGNOSIS — E282 Polycystic ovarian syndrome: Secondary | ICD-10-CM | POA: Diagnosis not present

## 2023-02-19 DIAGNOSIS — Z20828 Contact with and (suspected) exposure to other viral communicable diseases: Secondary | ICD-10-CM | POA: Diagnosis not present

## 2023-02-19 DIAGNOSIS — R519 Headache, unspecified: Secondary | ICD-10-CM | POA: Diagnosis not present

## 2023-07-22 DIAGNOSIS — K5909 Other constipation: Secondary | ICD-10-CM | POA: Diagnosis not present

## 2023-07-22 DIAGNOSIS — K625 Hemorrhage of anus and rectum: Secondary | ICD-10-CM | POA: Diagnosis not present

## 2023-12-31 DIAGNOSIS — F419 Anxiety disorder, unspecified: Secondary | ICD-10-CM | POA: Diagnosis not present

## 2024-01-13 DIAGNOSIS — F419 Anxiety disorder, unspecified: Secondary | ICD-10-CM | POA: Diagnosis not present

## 2024-01-28 DIAGNOSIS — F419 Anxiety disorder, unspecified: Secondary | ICD-10-CM | POA: Diagnosis not present

## 2024-03-07 DIAGNOSIS — G44221 Chronic tension-type headache, intractable: Secondary | ICD-10-CM | POA: Diagnosis not present

## 2024-03-07 DIAGNOSIS — J4520 Mild intermittent asthma, uncomplicated: Secondary | ICD-10-CM | POA: Diagnosis not present

## 2024-03-10 DIAGNOSIS — F419 Anxiety disorder, unspecified: Secondary | ICD-10-CM | POA: Diagnosis not present

## 2024-03-21 DIAGNOSIS — I10 Essential (primary) hypertension: Secondary | ICD-10-CM | POA: Diagnosis not present

## 2024-03-21 DIAGNOSIS — G44221 Chronic tension-type headache, intractable: Secondary | ICD-10-CM | POA: Diagnosis not present

## 2024-04-11 DIAGNOSIS — K219 Gastro-esophageal reflux disease without esophagitis: Secondary | ICD-10-CM | POA: Diagnosis not present

## 2024-04-11 DIAGNOSIS — R519 Headache, unspecified: Secondary | ICD-10-CM | POA: Diagnosis not present

## 2024-04-11 DIAGNOSIS — I10 Essential (primary) hypertension: Secondary | ICD-10-CM | POA: Diagnosis not present

## 2024-04-11 DIAGNOSIS — Z0000 Encounter for general adult medical examination without abnormal findings: Secondary | ICD-10-CM | POA: Diagnosis not present

## 2024-05-28 ENCOUNTER — Encounter (HOSPITAL_COMMUNITY): Payer: Self-pay | Admitting: Emergency Medicine

## 2024-05-28 ENCOUNTER — Ambulatory Visit (HOSPITAL_COMMUNITY)
Admission: EM | Admit: 2024-05-28 | Discharge: 2024-05-28 | Disposition: A | Discharge status: Home / Self Care | Attending: Family Medicine | Admitting: Family Medicine

## 2024-05-28 ENCOUNTER — Other Ambulatory Visit: Payer: Self-pay

## 2024-05-28 DIAGNOSIS — M79602 Pain in left arm: Secondary | ICD-10-CM

## 2024-05-28 DIAGNOSIS — R112 Nausea with vomiting, unspecified: Secondary | ICD-10-CM | POA: Diagnosis not present

## 2024-05-28 DIAGNOSIS — G43809 Other migraine, not intractable, without status migrainosus: Secondary | ICD-10-CM

## 2024-05-28 MED ORDER — ONDANSETRON 4 MG PO TBDP: 4.0000 mg | ORAL_TABLET | Freq: Two times a day (BID) | ORAL | 0 refills | Status: AC | PRN

## 2024-05-28 MED ORDER — NAPROXEN 500 MG PO TABS: 500.0000 mg | ORAL_TABLET | Freq: Two times a day (BID) | ORAL | 0 refills | Status: AC | PRN
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
## 2024-05-28 NOTE — Discharge Instructions (Signed)
 It was nice seeing you today. I am sorry about your symptoms. I think you have Migraine with nausea and vomiting. This might also be related to your arm pain. To make sure there is no serious brain concern, I recommend an ED visit to get a CT of your head done today. In the meantime, I sent in medication for migraine, arm pain, and vomiting. See PCP soon for reassessment. Please see us  soon if you have additional questions.

## 2024-05-28 NOTE — ED Triage Notes (Signed)
 Pt states she is been having HA, nausea and vomiting for the past 3-4 days and now she has left arm pain.
